# Patient Record
Sex: Male | Born: 1935 | Race: White | Hispanic: No | Smoking: Never smoker
Health system: Southern US, Community
[De-identification: ages and names within clinical notes are randomized; demographics above are authoritative.]

## PROBLEM LIST (undated history)

## (undated) DIAGNOSIS — I350 Nonrheumatic aortic (valve) stenosis: Secondary | ICD-10-CM

## (undated) DIAGNOSIS — Q250 Patent ductus arteriosus: Secondary | ICD-10-CM

## (undated) DIAGNOSIS — Z87442 Personal history of urinary calculi: Secondary | ICD-10-CM

## (undated) DIAGNOSIS — E119 Type 2 diabetes mellitus without complications: Secondary | ICD-10-CM

## (undated) DIAGNOSIS — I2510 Atherosclerotic heart disease of native coronary artery without angina pectoris: Secondary | ICD-10-CM

## (undated) DIAGNOSIS — E785 Hyperlipidemia, unspecified: Secondary | ICD-10-CM

## (undated) DIAGNOSIS — I10 Essential (primary) hypertension: Secondary | ICD-10-CM

## (undated) DIAGNOSIS — H409 Unspecified glaucoma: Secondary | ICD-10-CM

## (undated) DIAGNOSIS — C61 Malignant neoplasm of prostate: Secondary | ICD-10-CM

## (undated) DIAGNOSIS — Z8701 Personal history of pneumonia (recurrent): Secondary | ICD-10-CM

## (undated) DIAGNOSIS — G610 Guillain-Barre syndrome: Secondary | ICD-10-CM

## (undated) HISTORY — DX: Atherosclerotic heart disease of native coronary artery without angina pectoris: I25.10

## (undated) HISTORY — DX: Type 2 diabetes mellitus without complications: E11.9

## (undated) HISTORY — DX: Hyperlipidemia, unspecified: E78.5

## (undated) HISTORY — DX: Essential (primary) hypertension: I10

## (undated) HISTORY — DX: Personal history of urinary calculi: Z87.442

## (undated) HISTORY — DX: Malignant neoplasm of prostate: C61

## (undated) HISTORY — DX: Personal history of pneumonia (recurrent): Z87.01

## (undated) HISTORY — PX: OTHER SURGICAL HISTORY: SHX169

## (undated) HISTORY — DX: Nonrheumatic aortic (valve) stenosis: I35.0

## (undated) HISTORY — PX: COLONOSCOPY: SHX174

## (undated) HISTORY — DX: Patent ductus arteriosus: Q25.0

---

## 1960-12-21 HISTORY — PX: KNEE ARTHROSCOPY: SHX127

## 2002-09-07 ENCOUNTER — Encounter: Admission: RE | Admit: 2002-09-07 | Discharge: 2002-12-06 | Payer: Self-pay | Admitting: Internal Medicine

## 2003-04-19 ENCOUNTER — Ambulatory Visit: Admission: RE | Admit: 2003-04-19 | Discharge: 2003-07-04 | Payer: Self-pay | Admitting: Radiation Oncology

## 2003-04-27 ENCOUNTER — Encounter: Admission: RE | Admit: 2003-04-27 | Discharge: 2003-04-27 | Payer: Self-pay | Admitting: Urology

## 2003-04-27 ENCOUNTER — Encounter: Payer: Self-pay | Admitting: Urology

## 2003-06-08 ENCOUNTER — Ambulatory Visit (HOSPITAL_BASED_OUTPATIENT_CLINIC_OR_DEPARTMENT_OTHER): Admission: RE | Admit: 2003-06-08 | Discharge: 2003-06-08 | Payer: Self-pay | Admitting: Urology

## 2004-09-02 ENCOUNTER — Ambulatory Visit (HOSPITAL_COMMUNITY): Admission: RE | Admit: 2004-09-02 | Discharge: 2004-09-02 | Payer: Self-pay | Admitting: Internal Medicine

## 2009-03-12 ENCOUNTER — Encounter (INDEPENDENT_AMBULATORY_CARE_PROVIDER_SITE_OTHER): Payer: Self-pay | Admitting: Internal Medicine

## 2009-03-12 ENCOUNTER — Ambulatory Visit (HOSPITAL_COMMUNITY): Admission: RE | Admit: 2009-03-12 | Discharge: 2009-03-12 | Payer: Self-pay | Admitting: Internal Medicine

## 2009-03-12 ENCOUNTER — Ambulatory Visit: Payer: Self-pay | Admitting: Cardiology

## 2010-06-28 ENCOUNTER — Emergency Department (HOSPITAL_COMMUNITY): Admission: EM | Admit: 2010-06-28 | Discharge: 2010-06-28 | Payer: Self-pay | Admitting: Emergency Medicine

## 2010-10-15 ENCOUNTER — Ambulatory Visit: Payer: Self-pay | Admitting: Internal Medicine

## 2010-10-15 ENCOUNTER — Ambulatory Visit (HOSPITAL_COMMUNITY): Admission: RE | Admit: 2010-10-15 | Discharge: 2010-10-15 | Payer: Self-pay | Admitting: Internal Medicine

## 2010-12-21 HISTORY — PX: CARPAL TUNNEL RELEASE: SHX101

## 2011-03-08 LAB — DIFFERENTIAL
Basophils Absolute: 0 10*3/uL (ref 0.0–0.1)
Basophils Relative: 0 % (ref 0–1)
Eosinophils Absolute: 0.3 10*3/uL (ref 0.0–0.7)
Eosinophils Relative: 4 % (ref 0–5)
Monocytes Absolute: 0.5 10*3/uL (ref 0.1–1.0)

## 2011-03-08 LAB — BASIC METABOLIC PANEL
BUN: 17 mg/dL (ref 6–23)
Chloride: 104 mEq/L (ref 96–112)
GFR calc non Af Amer: >60 mL/min (ref >60)
Potassium: 4.3 mEq/L (ref 3.5–5.1)
Sodium: 134 mEq/L — ABNORMAL LOW (ref 135–145)

## 2011-03-08 LAB — URINALYSIS, ROUTINE W REFLEX MICROSCOPIC
Bilirubin Urine: NEGATIVE
Ketones, ur: NEGATIVE mg/dL
Nitrite: NEGATIVE
Protein, ur: NEGATIVE mg/dL
Urobilinogen, UA: 0.2 mg/dL (ref 0.0–1.0)
pH: 6 (ref 5.0–8.0)

## 2011-03-08 LAB — URINE CULTURE: Colony Count: NO GROWTH

## 2011-03-08 LAB — CBC
HCT: 40.1 % (ref 39.0–52.0)
Hemoglobin: 13.6 g/dL (ref 13.0–17.0)
MCV: 91.4 fL (ref 78.0–100.0)
RBC: 4.38 MIL/uL (ref 4.22–5.81)
RDW: 13.3 % (ref 11.5–15.5)
WBC: 6.7 10*3/uL (ref 4.0–10.5)

## 2011-05-08 NOTE — Op Note (Signed)
[name], [name]                     ACCOUNT NO.:  [id number]   MEDICAL RECORD NO.:  [id number]                   PATIENT TYPE:  AMB   LOCATION:  DAY                                  FACILITY:  [facility]   PHYSICIAN:  Lionel December, [name]                 DATE OF BIRTH:  [birth year]   DATE OF PROCEDURE:  09/02/2004  DATE OF DISCHARGE:                                 OPERATIVE REPORT   PROCEDURE:  Total colonoscopy.   INDICATIONS:  [name] is a 77 year old Caucasian male who is here for  screening colonoscopy. Family history is negative for colorectal carcinoma.  He has occasional hematochezia felt to be secondary to hemorrhoids.  Procedure and risks were reviewed with the patient and informed consent was  obtained.   PREOPERATIVE MEDICATIONS:  Demerol 25 mg IV, Versed 3 mg IV in divided  doses.   FINDINGS:  Procedure performed in endoscopy suite. The patient's vital signs  and O2 saturations were monitored during procedure and remained stable. The  patient was placed in the left lateral position and rectal examination  performed. No abnormality noted on external or digital exam. Olympus video  scope was placed in the rectum and advanced into sigmoid colon and beyond.  Preparation was excellent. Scope was advanced to the cecum which was  identified by appendiceal orifice and ileocecal valve. Picture taken for the  record. There were 2 small polyps, one at the cecum which was ablated via  cold biopsy, and the other one was at the hepatic flexure which was also  ablated by cold biopsy. These polyps were suspicious for hyperplastic polyps  and were submitted in one container. Mucosa of the rest of the colon was  normal. The rectal mucosa similarly was normal. Scope was retroflexed to  examine anorectal junction, and small hemorrhoids were noted above the  dentate line. The scope was straightened and withdrawn. The patient  tolerated the procedure well.   FINAL DIAGNOSES:  1.  Two  small polyps that were ablated by cold biopsy, one was at the cecum,      another one at the hepatic flexure.  2.  Internal hemorrhoids.   RECOMMENDATIONS:  Standard instructions given. I will be contacting patient  with biopsy results and further recommendations.      ___________________________________________                                            Lionel December, [name]   NR/MEDQ  D:  09/02/2004  T:  09/02/2004  Job:  [id number]

## 2011-05-08 NOTE — Op Note (Signed)
[name], [name]                    ACCOUNT NO.:  [id number]   MEDICAL RECORD NO.:  [id number]                   PATIENT TYPE:  AMB   LOCATION:  [facility]                                 FACILITY:  [facility]   PHYSICIAN:  Ronald L. Ovidio Hanger, [name]           DATE OF BIRTH:  [birth year]   DATE OF PROCEDURE:  06/08/2003  DATE OF DISCHARGE:                                 OPERATIVE REPORT   PREOPERATIVE DIAGNOSIS:  Adenocarcinoma of the prostate.   POSTOPERATIVE DIAGNOSIS:  Adenocarcinoma of the prostate.   PROCEDURE:  1. Transperineal implantation of Iodine-125 seeds in the prostate.  2. Flexible cystourethroscopy.   SURGEON:  Lucrezia Starch. Earlene Plater, [name]   ASSISTANT:  Wynn Banker, [name]   ANESTHESIA:  General laryngeal airway.   ESTIMATED BLOOD LOSS:  15 mL.   DRAINS:  16-French Foley.   COMPLICATIONS:  None.   OPERATIVE FINDINGS:  A total of 86 Iodine-125 seeds were implanted with 27  needles at 0.480 mCi per seed.   INDICATIONS:  [name] is a very nice 77 year old white male who  presented with an elevating PSA to 4.26.  he subsequently underwent  transrectal ultrasound and biopsy of the prostate which revealed a Gleason  score 6 which is 3+3 adenocarcinoma with 5% of the biopsy from the left side  of the prostate.  He has considered all options carefully. After  understanding the risks, benefits and alternatives, he has elected to  proceed with seed implantation.  He has been properly simulated and properly  informed.   DESCRIPTION OF PROCEDURE:  The patient was placed in the supine position.  After proper general laryngeal airway anesthesia, he was prepped and draped  with Betadine in a sterile fashion. After being placed in the dorsal  lithotomy position, a 16-French Foley catheter was inserted.  It was  inflated with 10 mL of contrast solution and the transrectal ultrasound  probe was placed in the Step Device and localized with preplanned  coordinates to  plan to place them 2 cm from the base and was utilized for  implantation.  Localization was then also performed fluoroscopically.  Both  the physical and electronic grids were placed.   A 16-French red rubber catheter was placed into the rectum to prevent flatus  and two holding needles were placed in unused coordinates and proper  measurements were obtained.  Utilizing both ultrasound and fluoroscopic  guidance, serial implantation of the prostate was performed.  A total of 86  seeds of iodine-125 with 27 needles at 0.480 mCi per seed were implanted and  we were comfortable with their localization both pre and postimplant, both  by fluoroscopy and by ultrasound.   Following implantation, the transrectal ultrasound probe was removed as was  the red rubber catheter.  Static images were obtained fluoroscopically for  documentation and the postplan was measured with the ultrasound device and  again confirmed a good symmetry.   The patient was  placed in the supine position after the wound had been  dressed sterilely.  The Foley catheter was removed and urethroscopy was  performed with an Olympus flexible cystourethroscope and he was noted to  have moderate trilobar hypertrophy, grade 1 trabeculation.  Efflux of clear  urine was noted from the normally placed ureteral orifices bilaterally but  there were no lesions, no seeds, no spacers in the bladder and the urethra.  The flexible cystourethroscope was visually removed.  A fresh 16-French  Foley catheter was passed.  The bladder was drained.   The patient was taken to the recovery room stable.                                               Ronald L. Ovidio Hanger, [name]    RLD/MEDQ  D:  06/08/2003  T:  06/08/2003  Job:  [id number]

## 2011-08-31 ENCOUNTER — Ambulatory Visit (HOSPITAL_COMMUNITY)
Admission: RE | Admit: 2011-08-31 | Discharge: 2011-08-31 | Disposition: A | Discharge status: Home / Self Care | Payer: Medicare Other | Source: Ambulatory Visit | Attending: Internal Medicine | Admitting: Internal Medicine

## 2011-08-31 ENCOUNTER — Other Ambulatory Visit (HOSPITAL_COMMUNITY): Payer: Self-pay | Admitting: Internal Medicine

## 2011-08-31 DIAGNOSIS — R059 Cough, unspecified: Secondary | ICD-10-CM

## 2011-08-31 DIAGNOSIS — R05 Cough: Secondary | ICD-10-CM | POA: Insufficient documentation

## 2011-09-04 ENCOUNTER — Encounter (HOSPITAL_BASED_OUTPATIENT_CLINIC_OR_DEPARTMENT_OTHER)
Admission: RE | Admit: 2011-09-04 | Discharge: 2011-09-04 | Disposition: A | Discharge status: Home / Self Care | Payer: Medicare Other | Source: Ambulatory Visit | Attending: Orthopedic Surgery | Admitting: Orthopedic Surgery

## 2011-09-04 LAB — BASIC METABOLIC PANEL
GFR calc Af Amer: >60 mL/min (ref >60)
GFR calc non Af Amer: >60 mL/min (ref >60)
Glucose, Bld: 112 mg/dL — ABNORMAL HIGH (ref 70–99)
Potassium: 4.5 mEq/L (ref 3.5–5.1)
Sodium: 138 mEq/L (ref 135–145)

## 2011-09-08 ENCOUNTER — Ambulatory Visit (HOSPITAL_BASED_OUTPATIENT_CLINIC_OR_DEPARTMENT_OTHER)
Admission: RE | Admit: 2011-09-08 | Discharge: 2011-09-08 | Disposition: A | Discharge status: Home / Self Care | Payer: Medicare Other | Source: Ambulatory Visit | Attending: Orthopedic Surgery | Admitting: Orthopedic Surgery

## 2011-09-08 DIAGNOSIS — Z01810 Encounter for preprocedural cardiovascular examination: Secondary | ICD-10-CM | POA: Insufficient documentation

## 2011-09-08 DIAGNOSIS — I10 Essential (primary) hypertension: Secondary | ICD-10-CM | POA: Insufficient documentation

## 2011-09-08 DIAGNOSIS — G5600 Carpal tunnel syndrome, unspecified upper limb: Secondary | ICD-10-CM | POA: Insufficient documentation

## 2011-09-08 DIAGNOSIS — Z8546 Personal history of malignant neoplasm of prostate: Secondary | ICD-10-CM | POA: Insufficient documentation

## 2011-09-08 DIAGNOSIS — G5620 Lesion of ulnar nerve, unspecified upper limb: Secondary | ICD-10-CM | POA: Insufficient documentation

## 2011-09-08 DIAGNOSIS — Z01812 Encounter for preprocedural laboratory examination: Secondary | ICD-10-CM | POA: Insufficient documentation

## 2011-09-22 NOTE — Op Note (Signed)
[name], [name] NO.:  [id number]  MEDICAL RECORD NO.:  [id number]  LOCATION:                                 FACILITY:  PHYSICIAN:  Cindee Salt, [name]       DATE OF BIRTH:  06/03/1929  DATE OF PROCEDURE:  09/08/2011 DATE OF DISCHARGE:                              OPERATIVE REPORT   PREOPERATIVE DIAGNOSES: 1. Carpal tunnel syndrome, left hand. 2. Cubital tunnel syndrome, left elbow.  POSTOPERATIVE DIAGNOSES: 1. Carpal tunnel syndrome, left hand. 2. Cubital tunnel syndrome, left elbow.  OPERATION: 1. Decompression median nerve, left wrist. 2. Decompression ulnar nerve, left elbow.  SURGEON:  Cindee Salt, MD  ASSISTANT:  Betha Loa, MD  ANESTHESIA:  General with local infiltration.  ANESTHESIOLOGIST:  Bedelia Person, MD.  HISTORY:  The patient is a 77 year old male with a history of numbness and tingling of his fingers of his left hand.  Nerve conductions are positive for carpal tunnel syndrome at his left wrist, cubital tunnel syndrome at his left elbow.  He is desirous of attempting to prevent further damage and hopefully improve sensibility.  He is aware that there is no guarantee with the surgery, possibility of infection; recurrence of injury to arteries, nerves, tendons; incomplete relief of symptoms, dystrophy.  In the preoperative area, the patient is seen. The extremity marked by both the patient and surgeon.  Antibiotic given.  PROCEDURE:  The patient was brought to the operating room where a general anesthetic was carried out without difficulty, was prepped using ChloraPrep, supine position, left arm free.  A 3-minute dry time was allowed.  Time-out taken, confirming the patient and procedure.  The limb was exsanguinated with an Esmarch bandage.  Tourniquet was placed high and the arm was inflated to 250 mmHg.  A longitudinal incision was made in the left palm, carried down through subcutaneous tissue. Bleeders were electrocauterized with  bipolar.  The superficial palmar arch was identified after splitting the palmar fascia.  The flexor tendon of the ring and little finger identified.  Retractor was placed through the ulnar side of the median nerve and carpal retinaculum was incised with sharp dissection, right angle and Sewall retractor were placed between skin and forearm fascia.  The fascia released for approximately a centimeter and a half proximal to the wrist crease under direct vision.  Area compression to the nerve was immediately apparent with an area of hyperemia and an hourglass deformity.  No further lesions were identified.  The wound was irrigated.  Skin closed with interrupted 5-0 Vicryl Rapide sutures.  A separate incision was then made on the left elbow medial side, carried down through subcutaneous tissue.  Bleeders again electrocauterized with bipolar.  The dissection carried down to the medial epicondyle.  Osborne fascia was identified in its posterior aspect.  This was incised revealing the ulnar nerve.  With blunt and sharp dissection, this was dissected free.  The subcutaneous tissue dissected off from the flexor carpi ulnaris fascia.  Two knee retractors were placed.  A fasciotomy was then performed for approximately 6 cm to 7 cm distally.  The muscle was then split.  A KMI retractor for carpal tunnel release was then inserted.  Angled ENT scissors were then passed down the blade, protecting the ulnar nerve distally, releasing the deep fascia of the flexor carpi ulnaris.  This again was done proximally 7 cm distally.  The nerve was identified, found to be intact over its entire course and well decompressed with no further bands.  Dissection was carried proximally.  Again, the subcutaneous tissue dissected off from the proximal forearm, upper arm fascia.  The knee retractors were placed.  The fascia was then split after protecting this with the [facility] carpal tunnel release skid.  The nerve was then  visualized proximally.  No further bands were noted.  The elbow flexed to full flexion, vessels crossing the nerve prevented this from anterior subluxating.  These were left intact.  The wound was copiously irrigated with saline.  Osborne fascia was then repaired to the dorsal posterior skin flap with 2-0 Vicryl sutures.  The subcutaneous tissue closed with interrupted 4-0 Vicryl and the skin with a subcuticular 5-0 Vicryl Rapide.  Each wound was then injected with 0.25% Marcaine without epinephrine, approximately 8 mL was used.  A sterile compressive dressing was applied.  This was long arm in nature with the fingers free.  On deflation of the tourniquet, all fingers immediately pinked.  He was taken to the recovery room for observation in satisfactory condition.  He will be discharged home to return to the [facility] [place] in 1 week on Vicodin.          ______________________________ Cindee Salt, [name]     GK/MEDQ  D:  09/08/2011  T:  09/08/2011  Job:  [id number]  cc:   Kingsley Callander. Ouida Sills, MD  Electronically Signed by Cindee Salt [name] on 09/22/2011 04:39:47 PM

## 2013-06-20 DIAGNOSIS — G610 Guillain-Barre syndrome: Secondary | ICD-10-CM

## 2013-06-20 HISTORY — DX: Guillain-Barre syndrome: G61.0

## 2013-06-30 ENCOUNTER — Other Ambulatory Visit (HOSPITAL_COMMUNITY): Payer: Self-pay | Admitting: Preventative Medicine

## 2013-06-30 DIAGNOSIS — R9389 Abnormal findings on diagnostic imaging of other specified body structures: Secondary | ICD-10-CM

## 2013-07-01 ENCOUNTER — Emergency Department (HOSPITAL_COMMUNITY)
Admission: EM | Admit: 2013-07-01 | Discharge: 2013-07-01 | Disposition: A | Discharge status: Home / Self Care | Payer: Medicare Other | Attending: Emergency Medicine | Admitting: Emergency Medicine

## 2013-07-01 ENCOUNTER — Encounter (HOSPITAL_COMMUNITY): Payer: Self-pay | Admitting: *Deleted

## 2013-07-01 ENCOUNTER — Emergency Department (HOSPITAL_COMMUNITY): Payer: Medicare Other

## 2013-07-01 DIAGNOSIS — Z79899 Other long term (current) drug therapy: Secondary | ICD-10-CM | POA: Insufficient documentation

## 2013-07-01 DIAGNOSIS — Z859 Personal history of malignant neoplasm, unspecified: Secondary | ICD-10-CM | POA: Insufficient documentation

## 2013-07-01 DIAGNOSIS — I10 Essential (primary) hypertension: Secondary | ICD-10-CM | POA: Insufficient documentation

## 2013-07-01 DIAGNOSIS — R0602 Shortness of breath: Secondary | ICD-10-CM | POA: Insufficient documentation

## 2013-07-01 DIAGNOSIS — E7800 Pure hypercholesterolemia, unspecified: Secondary | ICD-10-CM | POA: Insufficient documentation

## 2013-07-01 DIAGNOSIS — R112 Nausea with vomiting, unspecified: Secondary | ICD-10-CM | POA: Insufficient documentation

## 2013-07-01 DIAGNOSIS — R918 Other nonspecific abnormal finding of lung field: Secondary | ICD-10-CM

## 2013-07-01 DIAGNOSIS — Z8701 Personal history of pneumonia (recurrent): Secondary | ICD-10-CM | POA: Insufficient documentation

## 2013-07-01 DIAGNOSIS — E119 Type 2 diabetes mellitus without complications: Secondary | ICD-10-CM | POA: Insufficient documentation

## 2013-07-01 DIAGNOSIS — R05 Cough: Secondary | ICD-10-CM | POA: Insufficient documentation

## 2013-07-01 DIAGNOSIS — R059 Cough, unspecified: Secondary | ICD-10-CM | POA: Insufficient documentation

## 2013-07-01 DIAGNOSIS — Z9889 Other specified postprocedural states: Secondary | ICD-10-CM | POA: Insufficient documentation

## 2013-07-01 DIAGNOSIS — Z7982 Long term (current) use of aspirin: Secondary | ICD-10-CM | POA: Insufficient documentation

## 2013-07-01 DIAGNOSIS — R61 Generalized hyperhidrosis: Secondary | ICD-10-CM | POA: Insufficient documentation

## 2013-07-01 DIAGNOSIS — R0989 Other specified symptoms and signs involving the circulatory and respiratory systems: Secondary | ICD-10-CM | POA: Insufficient documentation

## 2013-07-01 DIAGNOSIS — R222 Localized swelling, mass and lump, trunk: Secondary | ICD-10-CM | POA: Insufficient documentation

## 2013-07-01 LAB — COMPREHENSIVE METABOLIC PANEL
ALT: 138 U/L — ABNORMAL HIGH (ref 0–53)
Albumin: 3 g/dL — ABNORMAL LOW (ref 3.5–5.2)
Alkaline Phosphatase: 125 U/L — ABNORMAL HIGH (ref 39–117)
Glucose, Bld: 204 mg/dL — ABNORMAL HIGH (ref 70–99)
Potassium: 4.1 mEq/L (ref 3.5–5.1)
Sodium: 132 mEq/L — ABNORMAL LOW (ref 135–145)
Total Protein: 6.5 g/dL (ref 6.0–8.3)

## 2013-07-01 LAB — URINE MICROSCOPIC-ADD ON

## 2013-07-01 LAB — CBC WITH DIFFERENTIAL/PLATELET
Basophils Absolute: 0 10*3/uL (ref 0.0–0.1)
Basophils Relative: 0 % (ref 0–1)
Eosinophils Relative: 3 % (ref 0–5)
HCT: 36.8 % — ABNORMAL LOW (ref 39.0–52.0)
MCH: 29.7 pg (ref 26.0–34.0)
MCHC: 34.5 g/dL (ref 30.0–36.0)
MCV: 86.2 fL (ref 78.0–100.0)
Monocytes Absolute: 0.4 10*3/uL (ref 0.1–1.0)
RDW: 12.5 % (ref 11.5–15.5)

## 2013-07-01 LAB — URINALYSIS, ROUTINE W REFLEX MICROSCOPIC
Protein, ur: 30 mg/dL — AB
Urobilinogen, UA: 2 mg/dL — ABNORMAL HIGH (ref 0.0–1.0)

## 2013-07-01 MED ORDER — SODIUM CHLORIDE 0.9 % IV BOLUS (SEPSIS)
500.0000 mL | Freq: Once | INTRAVENOUS | Status: AC
  Administered 2013-07-01: 500 mL via INTRAVENOUS

## 2013-07-01 MED ORDER — OXYCODONE-ACETAMINOPHEN 5-325 MG PO TABS
1.0000 | ORAL_TABLET | Freq: Once | ORAL | Status: AC
  Administered 2013-07-01: 1 via ORAL
  Filled 2013-07-01: qty 1

## 2013-07-01 MED ORDER — CYCLOBENZAPRINE HCL 10 MG PO TABS
10.0000 mg | ORAL_TABLET | Freq: Once | ORAL | Status: AC
  Administered 2013-07-01: 10 mg via ORAL
  Filled 2013-07-01: qty 1

## 2013-07-01 MED ORDER — OXYCODONE-ACETAMINOPHEN 5-325 MG PO TABS: 1.0000 | ORAL_TABLET | ORAL | Status: DC | PRN

## 2013-07-01 MED ORDER — IOHEXOL 300 MG/ML  SOLN
100.0000 mL | Freq: Once | INTRAMUSCULAR | Status: AC | PRN
  Administered 2013-07-01: 100 mL via INTRAVENOUS

## 2013-07-01 MED ORDER — ONDANSETRON HCL 4 MG/2ML IJ SOLN
4.0000 mg | Freq: Once | INTRAMUSCULAR | Status: AC
  Administered 2013-07-01: 4 mg via INTRAVENOUS
  Filled 2013-07-01: qty 2

## 2013-07-01 MED ORDER — CYCLOBENZAPRINE HCL 10 MG PO TABS: 10.0000 mg | ORAL_TABLET | Freq: Two times a day (BID) | ORAL | Status: DC | PRN

## 2013-07-01 NOTE — ED Notes (Signed)
Step-daughter states that the patient can not sit still, complaining of back pain, increasingly agitated. MD notified.

## 2013-07-01 NOTE — ED Notes (Signed)
Pt was seen at an urgent care 06-30-2013 and was given Levofloxacin and a steroid for an "abnormal chest xray." Pt c/o chest congestion x 1wk and nausea and vomiting today. Pt has been given an appointment for a CT chest w contrast for July 15th. Family reports pt seems to be getting weaker and has not been getting any sleep.

## 2013-07-01 NOTE — ED Provider Notes (Signed)
History  This chart was scribed for Donnetta Hutching, MD by Ardelia Mems, ED Scribe. This patient was seen in room [facility]08[facility] and the patient's care was started at 7:36 PM.  CSN: [id number]  Arrival date & time 07/01/13  1844   Chief Complaint  Patient presents with  . Nausea  . Emesis  . Fatigue    The history is provided by the patient and a relative. No language interpreter was used.   HPI Comments: [name] is a 79 y.o. male with a hx of HTN and DM who presents to the Emergency Department complaining of chest congestion of 1 weeks duration with associated nausea and vomiting. Pt also reports an associated non-productive cough over the last week, which he states has subsided. Pt denies chest pain or any other pain. Pt states that he has been exhausted for the last week. Pt was seen in Urgent Care yesterday for SOB, labored breathing, and he was sweaty and clammy. Pt received a CXR yesterday which showed a density in the left hilar region. Pt was treated as though he had pneumonia, given Levofloxacin and a Prednisone shot, and told to come to the ED if his symptoms got worse within 24 hours. Pt is here today because he was eating soup tonight and began vomiting. Family in the room states that pt has been getting weaker and hasn't been able to sleep. Pt takes daily medications for cholesterol, diabetes and hypertension, but states that he is normally healthy and able to walk all day long.  PCP- Dr. Ouida Sills   Past Medical History  Diagnosis Date  . Hypertension   . Diabetes mellitus without complication   . High cholesterol   . Cancer     11 yrs ago   Past Surgical History  Procedure Laterality Date  . Open heart surgery      1951   History reviewed. No pertinent family history. History  Substance Use Topics  . Smoking status: Never Smoker   . Smokeless tobacco: Not on file  . Alcohol Use: No    Review of Systems  Constitutional: Positive for diaphoresis (subsided)  and fatigue. Negative for fever and chills.  HENT: Negative for sore throat, rhinorrhea and neck pain.   Eyes: Negative for visual disturbance.  Respiratory: Positive for cough (subsided) and shortness of breath (subsided).        Chest congestion.  Cardiovascular: Negative for chest pain and leg swelling.  Gastrointestinal: Positive for nausea and vomiting. Negative for abdominal pain and diarrhea.  Genitourinary: Negative for dysuria.  Musculoskeletal: Negative for back pain.  Skin: Negative for rash.  Neurological: Negative for headaches.  Psychiatric/Behavioral: Negative for confusion.   A complete 10 system review of systems was obtained and all systems are negative except as noted in the HPI and PMH.   Allergies  Review of patient's allergies indicates no known allergies.  Home Medications   Current Outpatient Rx  Name  Route  Sig  Dispense  Refill  . aspirin 81 MG tablet   Oral   Take 81 mg by mouth daily.         . Flaxseed, Linseed, (FLAX PO)   Oral   Take 1,000 each by mouth 2 (two) times daily.         . Glucosamine-Chondroit-Vit C-Mn (GLUCOSAMINE CHONDR 1500 COMPLX) CAPS   Oral   Take 1,500 each by mouth 2 (two) times daily.         Marland Kitchen levofloxacin (LEVAQUIN) 500 MG tablet  Oral   Take 500 mg by mouth daily.         . metFORMIN (GLUMETZA) 500 MG (MOD) 24 hr tablet   Oral   Take 500 mg by mouth 2 (two) times daily with a meal.         . Omega-3 Fatty Acids (FISH OIL) 1000 MG CAPS   Oral   Take 1,000 mg by mouth 2 (two) times daily.         . ramipril (ALTACE) 10 MG tablet   Oral   Take 10 mg by mouth daily.         . simvastatin (ZOCOR) 20 MG tablet   Oral   Take 20 mg by mouth at bedtime.          Triage Vitals: BP 143/72  Pulse 104  Temp(Src) 100.6 F (38.1 C) (Oral)  Resp 22  Ht 5\' 9"  (1.753 m)  Wt 190 lb (86.183 kg)  BMI 28.05 kg/m2  SpO2 95%  Physical Exam  Nursing note and vitals reviewed. Constitutional: He is  oriented to person, place, and time. He appears well-developed and well-nourished.  HENT:  Head: Normocephalic and atraumatic.  Eyes: Conjunctivae and EOM are normal. Pupils are equal, round, and reactive to light.  Neck: Normal range of motion. Neck supple.  Cardiovascular: Normal rate, regular rhythm and normal heart sounds.   Pulmonary/Chest: Effort normal and breath sounds normal.  Abdominal: Soft. Bowel sounds are normal.  Musculoskeletal: Normal range of motion.  Neurological: He is alert and oriented to person, place, and time.  Skin: Skin is warm and dry.  Psychiatric: He has a normal mood and affect.    ED Course  Procedures (including critical care time)  DIAGNOSTIC STUDIES: Oxygen Saturation is 95% on RA, normal by my interpretation.    COORDINATION OF CARE: 7:46 PM- Pt advised of plan to check his blood work and urine, give him IV fluids and a CT of his chest to assess his lungs and pt agrees. Pt is also agreeable to receiving ant-nausea medication.  Medications  sodium chloride 0.9 % bolus 500 mL (0 mLs Intravenous Stopped 07/01/13 2132)  sodium chloride 0.9 % bolus 500 mL (0 mLs Intravenous Stopped 07/01/13 2132)  ondansetron (ZOFRAN) injection 4 mg (4 mg Intravenous Given 07/01/13 2020)  iohexol (OMNIPAQUE) 300 MG/ML solution 100 mL (100 mLs Intravenous Contrast Given 07/01/13 2147)   Labs Reviewed  COMPREHENSIVE METABOLIC PANEL - Abnormal; Notable for the following:    Sodium 132 (*)    Chloride 95 (*)    Glucose, Bld 204 (*)    Albumin 3.0 (*)    AST 95 (*)    ALT 138 (*)    Alkaline Phosphatase 125 (*)    GFR calc non Af Amer 75 (*)    GFR calc Af Amer 87 (*)    All other components within normal limits  CBC WITH DIFFERENTIAL - Abnormal; Notable for the following:    Hemoglobin 12.7 (*)    HCT 36.8 (*)    Neutrophils Relative % 86 (*)    Lymphocytes Relative 6 (*)    Lymphs Abs 0.5 (*)    All other components within normal limits  LIPASE, BLOOD -  Abnormal; Notable for the following:    Lipase 102 (*)    All other components within normal limits  URINALYSIS, ROUTINE W REFLEX MICROSCOPIC - Abnormal; Notable for the following:    Specific Gravity, Urine >1.030 (*)    Hgb urine dipstick TRACE (*)  Bilirubin Urine SMALL (*)    Ketones, ur TRACE (*)    Protein, ur 30 (*)    Urobilinogen, UA 2.0 (*)    All other components within normal limits  URINE MICROSCOPIC-ADD ON - Abnormal; Notable for the following:    Bacteria, UA FEW (*)    All other components within normal limits  URINE CULTURE   Ct Chest W Contrast  07/01/2013   *RADIOLOGY REPORT*  Clinical Data: Left hilar soft tissue fullness demonstrated on previous chest radiograph.  Further evaluation.  CT CHEST WITH CONTRAST  Technique:  Multidetector CT imaging of the chest was performed following the standard protocol during bolus administration of intravenous contrast.  Contrast: OMNIPAQUE IOHEXOL 300 MG/ML  SOLN  Comparison: Chest 06/30/2013.  Findings: Left hilar mass causing some narrowing of the left upper lung bronchus with mild postobstructive change in the lingula. This may represent a focal mass or confluent lymphadenopathy.  The structure is measured at about 2.2 x 2.7 cm.  There is additional lymphadenopathy in the subcarinal, pretracheal, and left parabronchial regions.  Subcarinal lymph nodes measure 1.3 x 3.2 cm.  Changes could be due to lymphoma or metastasis.  No parenchymal nodules demonstrated in the left lung.  There is a focal ground-glass nodule in the right lower lung posteriorly measuring about 1.2 cm diameter.  This is indeterminate and could represent inflammatory nodule or neoplastic change.  There is focal pleural thickening along the left posterior lower chest wall.  This also could represent inflammatory or neoplastic change.  Normal heart size.  Coronary artery calcification.  Normal caliber thoracic aorta with calcification.  The esophagus is decompressed.  No pleural effusions.  Visualized portions of the upper abdominal organs are grossly unremarkable.  Suggestion of sub centimeter cyst in the liver, incompletely evaluated.  Small esophageal hiatal hernia.  Emphysematous changes in the lungs.  No pneumothorax.  The diffuse degenerative changes throughout the thoracic spine.  No destructive bone lesions appreciated.  Deformity of the left fifth rib posteriorly is likely postoperative.  IMPRESSION: Left hilar mass/lymphadenopathy causing narrowing of the left upper lobe bronchus.  Mild postobstructive change in the left lingula. Additional left peribronchial and mediastinal lymphadenopathy is also identified.  Left-sided pleural thickening.  Focal ground- glass nodule in the right lung.  Findings may represent lymphoma, primary neoplasm, or metastasis.   Original Report Authenticated By: Burman Nieves, [name]   No diagnosis found.  MDM  CT scan of chest shows a left hilar mass/lymphadenopathy.    This was discussed with the patient and his family. They will followup with her primary care doctor on Monday for further evaluation including biopsy and referral to specialist.   Prescription for Flexeril 10 mg and Percocet given        I personally performed the services described in this documentation, which was scribed in my presence. The recorded information has been reviewed and is accurate.    Donnetta Hutching, MD 07/01/13 [phone]

## 2013-07-03 DIAGNOSIS — I10 Essential (primary) hypertension: Secondary | ICD-10-CM | POA: Insufficient documentation

## 2013-07-03 DIAGNOSIS — R21 Rash and other nonspecific skin eruption: Secondary | ICD-10-CM | POA: Insufficient documentation

## 2013-07-03 DIAGNOSIS — R918 Other nonspecific abnormal finding of lung field: Secondary | ICD-10-CM | POA: Insufficient documentation

## 2013-07-03 DIAGNOSIS — E119 Type 2 diabetes mellitus without complications: Secondary | ICD-10-CM | POA: Insufficient documentation

## 2013-07-03 DIAGNOSIS — E785 Hyperlipidemia, unspecified: Secondary | ICD-10-CM | POA: Insufficient documentation

## 2013-07-03 LAB — URINE CULTURE: Culture: NO GROWTH

## 2013-07-04 ENCOUNTER — Ambulatory Visit (HOSPITAL_COMMUNITY): Payer: Medicare Other

## 2013-07-04 DIAGNOSIS — R0600 Dyspnea, unspecified: Secondary | ICD-10-CM | POA: Insufficient documentation

## 2013-07-04 DIAGNOSIS — E871 Hypo-osmolality and hyponatremia: Secondary | ICD-10-CM | POA: Insufficient documentation

## 2013-07-06 ENCOUNTER — Ambulatory Visit (HOSPITAL_COMMUNITY): Payer: Medicare Other | Attending: Internal Medicine

## 2013-07-07 DIAGNOSIS — I82409 Acute embolism and thrombosis of unspecified deep veins of unspecified lower extremity: Secondary | ICD-10-CM | POA: Insufficient documentation

## 2013-07-13 DIAGNOSIS — G610 Guillain-Barre syndrome: Secondary | ICD-10-CM | POA: Insufficient documentation

## 2013-07-21 HISTORY — PX: VIDEO ASSISTED THORACOSCOPY (VATS)/THOROCOTOMY: SHX6173

## 2013-07-27 ENCOUNTER — Inpatient Hospital Stay
Admission: RE | Admit: 2013-07-27 | Discharge: 2013-09-14 | Disposition: A | Discharge status: Home / Self Care | Payer: Medicare Other | Source: Ambulatory Visit | Attending: Internal Medicine | Admitting: Internal Medicine

## 2013-07-27 DIAGNOSIS — R52 Pain, unspecified: Principal | ICD-10-CM

## 2013-07-28 LAB — GLUCOSE, CAPILLARY
Comment 1: 286421
Comment 1: 286421
Comment 2: 254911
Glucose-Capillary: 201 mg/dL — ABNORMAL HIGH (ref 70–99)
Glucose-Capillary: 140 mg/dL — ABNORMAL HIGH (ref 70–99)

## 2013-07-29 LAB — GLUCOSE, CAPILLARY: Glucose-Capillary: 127 mg/dL — ABNORMAL HIGH (ref 70–99)

## 2013-07-30 LAB — GLUCOSE, CAPILLARY
Glucose-Capillary: 134 mg/dL — ABNORMAL HIGH (ref 70–99)
Glucose-Capillary: 110 mg/dL — ABNORMAL HIGH (ref 70–99)
Glucose-Capillary: 141 mg/dL — ABNORMAL HIGH (ref 70–99)
Glucose-Capillary: 150 mg/dL — ABNORMAL HIGH (ref 70–99)

## 2013-07-31 ENCOUNTER — Other Ambulatory Visit (HOSPITAL_COMMUNITY): Payer: Medicare Other

## 2013-07-31 ENCOUNTER — Ambulatory Visit (HOSPITAL_COMMUNITY)
Admit: 2013-07-31 | Discharge: 2013-07-31 | Disposition: A | Discharge status: Home / Self Care | Payer: Medicare Other | Attending: Internal Medicine | Admitting: Internal Medicine

## 2013-07-31 DIAGNOSIS — M25559 Pain in unspecified hip: Secondary | ICD-10-CM | POA: Insufficient documentation

## 2013-07-31 DIAGNOSIS — M25569 Pain in unspecified knee: Secondary | ICD-10-CM | POA: Insufficient documentation

## 2013-07-31 LAB — GLUCOSE, CAPILLARY: Glucose-Capillary: 126 mg/dL — ABNORMAL HIGH (ref 70–99)

## 2013-08-01 LAB — GLUCOSE, CAPILLARY
Glucose-Capillary: 140 mg/dL — ABNORMAL HIGH (ref 70–99)
Glucose-Capillary: 115 mg/dL — ABNORMAL HIGH (ref 70–99)
Glucose-Capillary: 165 mg/dL — ABNORMAL HIGH (ref 70–99)

## 2013-08-02 LAB — GLUCOSE, CAPILLARY
Glucose-Capillary: 132 mg/dL — ABNORMAL HIGH (ref 70–99)
Glucose-Capillary: 114 mg/dL — ABNORMAL HIGH (ref 70–99)

## 2013-08-04 LAB — GLUCOSE, CAPILLARY
Glucose-Capillary: 121 mg/dL — ABNORMAL HIGH (ref 70–99)
Glucose-Capillary: 130 mg/dL — ABNORMAL HIGH (ref 70–99)
Glucose-Capillary: 121 mg/dL — ABNORMAL HIGH (ref 70–99)

## 2013-08-05 LAB — GLUCOSE, CAPILLARY

## 2013-08-06 LAB — GLUCOSE, CAPILLARY
Glucose-Capillary: 129 mg/dL — ABNORMAL HIGH (ref 70–99)
Glucose-Capillary: 151 mg/dL — ABNORMAL HIGH (ref 70–99)
Glucose-Capillary: 107 mg/dL — ABNORMAL HIGH (ref 70–99)

## 2013-08-07 LAB — GLUCOSE, CAPILLARY: Glucose-Capillary: 136 mg/dL — ABNORMAL HIGH (ref 70–99)

## 2013-08-08 LAB — GLUCOSE, CAPILLARY
Glucose-Capillary: 117 mg/dL — ABNORMAL HIGH (ref 70–99)
Glucose-Capillary: 174 mg/dL — ABNORMAL HIGH (ref 70–99)
Glucose-Capillary: 123 mg/dL — ABNORMAL HIGH (ref 70–99)
Glucose-Capillary: 126 mg/dL — ABNORMAL HIGH (ref 70–99)

## 2013-08-09 LAB — GLUCOSE, CAPILLARY: Glucose-Capillary: 134 mg/dL — ABNORMAL HIGH (ref 70–99)

## 2013-08-10 LAB — GLUCOSE, CAPILLARY: Glucose-Capillary: 164 mg/dL — ABNORMAL HIGH (ref 70–99)

## 2013-08-11 NOTE — H&P (Signed)
[name], [name]           ACCOUNT NO.:  [id number]  MEDICAL RECORD NO.:  [id number]  LOCATION:  RAD                           FACILITY:  [facility]  PHYSICIAN:  Kingsley Callander. Ouida Sills, MD       DATE OF BIRTH:  [birth year]  DATE OF ADMISSION:  07/31/2013 DATE OF DISCHARGE:  08/11/2014LH                             HISTORY & PHYSICAL   HISTORY OF PRESENT ILLNESS:  This patient is an 79 year old white male, who was recently discharged from [facility] and transferred to the [facility] for rehabilitation.  The patient had been found to have a lung mass by chest CT in the [facility] Emergency Room in July.  He developed increasing shortness of breath, and was hospitalized at [facility] on July 03, 2013.  He had a 4 cm left hilar mass, which was felt to be possibly related to lymphoma versus an inflammatory condition.  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 This was felt to possibly be paraneoplastic in origin.  He was treated with plasma phoresis.  Since arriving at the [facility], he has shown slight improvement in his leg strength.  He developed a DVT in his right leg while hospitalized and has been anticoagulated with Coumadin.  He developed an ileus on July 14, 2013, with respiratory compromise requiring brief mechanical ventilation.  He was weaned and has been able to breathe on oxygenating satisfactorily since that time.  He was treated with a 14-day course of fluconazole empirically.  He has since had a lymph node biopsy return revealing cryptococcus.  This was discussed with Dr. Sedalia Muta from Infectious Diseases.  He was started on fluconazole 200 mg daily and will have followup in the Infectious Diseases Clinic at [facility] in September.  Coumadin modification has been required with this therapy.  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  He develops pseudogout in his knees and was treated with aspiration and injections of Kenalog on August 02,2014.  He developed urinary retention requiring Foley catheter placement.  He has had a bone marrow biopsy without a definitive conclusion at this point.  He has a past history of prostate carcinoma.  He has had no evidence of recurrent disease.  He has also had diabetes and hypertension, which have been stable.  He also has a history of aortic stenosis which is mild.  He has a history of a patent ductus with surgery at age 21 at [facility] in [facility]. He also has a remote history of kidney stones, and left knee surgery for cartilage injury.  MEDICATIONS: 1. Carvedilol 6.25 mg b.i.d. 2. Senokot 1 tab b.i.d. 3. MiraLAX 17 g daily p.r.n. for constipation. 4. Vitamin B12, 1000 mcg p.o. daily. 5. Hydrochlorothiazide 25 mg daily. 6. Pyridoxine 50 mg daily. 7. Coumadin 2.5 mg daily. 8. Metformin 500 mg b.i.d. 9. Ramipril 10 mg daily. 10.Simvastatin 20 mg daily. 11.Fluconazole 200 mg daily.  ALLERGIES:  None.  SOCIAL HISTORY:  He does not smoke cigarettes, drink alcohol, or use recreational substances.  FAMILY  HISTORY:  His father had an MI and died at 64.  His mother died of breast cancer.  A brother died at 48 with liver cancer.  Sister had Legionella.  REVIEW OF SYSTEMS:  He is breathing well at this point.  He is not experiencing chest pain or abdominal pain.  He is able to eat without difficulty.  He has significant weakness in his legs with much better strength in his upper extremities.  Foley catheter remains in place.  He has had some left hip pain after some recent physical therapy.  X-rays revealed no fracture.  PHYSICAL EXAMINATION:  GENERAL:  Alert, comfortable appearing, and fully oriented. HEENT:  Eyes reveal no scleral icterus.  Nose and oropharynx are unremarkable. NECK:  Reveals no JVD  or thyromegaly. LUNGS:  Clear. HEART:  Regular with a grade 2 systolic murmur radiating to the neck. ABDOMEN:  Soft and nontender with no palpable organomegaly. EXTREMITIES:  Reveal no clubbing or edema.  No calf tenderness or swelling.  NEURO:  Has marked weakness in the legs greater on the left than the right. SKIN:  Warm and dry. LYMPH NODES:  No cervical or supraclavicular enlargement.  IMPRESSION/PLAN: 1. Left lung mass.  The plan at this point is to obtain a followup CT     scan in 3 months. 2. Positive lymph node biopsy revealing cryptococcus treat with     fluconazole. 3. Right leg deep venous thrombosis.  Continue Coumadin. 4. Guillain-Barre syndrome, status post plasma phoresis, continue     physical therapy. 5. Ileus resolved. 6. Urinary retention.  Continue Foley catheter for now. 7. Syndrome of inappropriate antidiuretic hormone and hyponatremia.     Recheck electrolytes, recheck LFTs. 8. Pseudogout, resolved. 9. Diabetes, continue metformin. 10.Hypertension, continue Altace. 11.Hyperlipidemia, continue ramipril. 12.Mild aortic stenosis. 13.History of patent ductus closure. 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 15.History of prostate carcinoma.  Status post radioactive seed in     2004.     Kingsley Callander. Ouida Sills, MD    ROF/MEDQ  D:  08/11/2013  T:  08/11/2013  Job:  [id number]

## 2013-08-16 LAB — GLUCOSE, CAPILLARY: Glucose-Capillary: 142 mg/dL — ABNORMAL HIGH (ref 70–99)

## 2013-08-18 LAB — GLUCOSE, CAPILLARY
Glucose-Capillary: 133 mg/dL — ABNORMAL HIGH (ref 70–99)
Glucose-Capillary: 141 mg/dL — ABNORMAL HIGH (ref 70–99)

## 2013-08-25 LAB — GLUCOSE, CAPILLARY: Glucose-Capillary: 136 mg/dL — ABNORMAL HIGH (ref 70–99)

## 2013-08-28 LAB — GLUCOSE, CAPILLARY: Glucose-Capillary: 125 mg/dL — ABNORMAL HIGH (ref 70–99)

## 2013-09-06 LAB — GLUCOSE, CAPILLARY: Glucose-Capillary: 125 mg/dL — ABNORMAL HIGH (ref 70–99)

## 2013-09-11 LAB — GLUCOSE, CAPILLARY: Glucose-Capillary: 135 mg/dL — ABNORMAL HIGH (ref 70–99)

## 2013-09-12 NOTE — Progress Notes (Unsigned)
NAMENAPOLEAN, [name]           ACCOUNT NO.:  [id number]  MEDICAL RECORD NO.:  [id number]  LOCATION:  [id number]                          FACILITY:  [facility]  PHYSICIAN:  Kingsley Callander. Ouida Sills, MD       DATE OF BIRTH:  1929-02-09  DATE OF PROCEDURE:  09/07/2013 DATE OF DISCHARGE:                                PROGRESS NOTE   SUBJECTIVE:  [name] has continued to make gradual improvement with his rehab stay.  He is avoiding effectively now without his Foley catheter.  He is walking with his walker.  His leg strength is improving.  He states he still has a diminished appetite.  OBJECTIVE:  GENERAL:  He appears in good spirits.  He is alert and oriented.  Speech is intact. LUNGS:  Clear. HEART:  Regular with a grade 2 systolic murmur. ABDOMEN:  Soft and nontender. EXTREMITIES:  Reveal no edema or calf swelling. NEUROLOGIC:  Reveals he is able to stand independently and walk across the room without difficulty using his walker.  He still has residual lower extremity weakness.  IMPRESSION/PLAN: 1. Lung mass.  He has had followup with Pulmonary and Infectious     Diseases.  He will have a repeat CT scan of the chest next month. 2. Guillain-Barre syndrome, improving.  Continue physical therapy.  He     has had a neurology visit at [facility], however, reports are not     available.  We will try to obtain reports from his 3 recent     evaluations. 3. Deep venous thrombosis.  He has been satisfactorily anticoagulated     with Coumadin. 4. Positive cryptococcus biopsy.  Continue antifungal therapy for     another 6 weeks.  Case has been discussed with his son.     Kingsley Callander. Ouida Sills, MD     ROF/MEDQ  D:  09/12/2013  T:  09/12/2013  Job:  [id number]

## 2013-10-23 ENCOUNTER — Ambulatory Visit (HOSPITAL_COMMUNITY)
Admission: RE | Admit: 2013-10-23 | Discharge: 2013-10-23 | Disposition: A | Discharge status: Home / Self Care | Payer: Medicare Other | Source: Ambulatory Visit | Attending: Internal Medicine | Admitting: Internal Medicine

## 2013-10-23 DIAGNOSIS — E7800 Pure hypercholesterolemia, unspecified: Secondary | ICD-10-CM | POA: Insufficient documentation

## 2013-10-23 DIAGNOSIS — R29898 Other symptoms and signs involving the musculoskeletal system: Secondary | ICD-10-CM | POA: Insufficient documentation

## 2013-10-23 DIAGNOSIS — R262 Difficulty in walking, not elsewhere classified: Secondary | ICD-10-CM | POA: Insufficient documentation

## 2013-10-23 DIAGNOSIS — E119 Type 2 diabetes mellitus without complications: Secondary | ICD-10-CM | POA: Insufficient documentation

## 2013-10-23 DIAGNOSIS — IMO0001 Reserved for inherently not codable concepts without codable children: Secondary | ICD-10-CM | POA: Insufficient documentation

## 2013-10-23 DIAGNOSIS — I10 Essential (primary) hypertension: Secondary | ICD-10-CM | POA: Insufficient documentation

## 2013-10-23 NOTE — Evaluation (Signed)
Physical Therapy Evaluation  Patient Details  Name: [name] MRN: [id number] Date of Birth: 08/15/1929  Today's Date: 10/23/2013 Time: 1435-1520 PT Time Calculation (min): 45 min Charges:  1 evaluation              Visit#: 1 of 10  Re-eval: 11/22/13    Authorization: BCBS Medicare    Authorization Time Period:    Authorization Visit#: 1 of 10   Past Medical History:  Past Medical History  Diagnosis Date  . Hypertension   . Diabetes mellitus without complication   . High cholesterol   . Cancer     11 yrs ago   Past Surgical History:  Past Surgical History  Procedure Laterality Date  . Open heart surgery      1951    Subjective Symptoms/Limitations Symptoms: Pt is an 79 year old male referred to PT for Bil LE weakness (L>R).  Pt was admitted to [facility] on July 14th with Margarita Mail and was referred to [facility] from Aug 7-Sept 25th and then recieved HHPT until last week (when he was then when he was diagnosised with shingles and had to cancel OP PT for last week).   How long can you sit comfortably?: no problem sitting in his favorite chair.  Difficulty sitting for long peroid of time in a hard chair How long can you stand comfortably?: 5-8 minutes How long can you walk comfortably?: 5 minutes  Patient Stated Goals: be able to walk better.  Pain Assessment Currently in Pain?: No/denies Pain Location: Head (Headache) Effect of Pain on Daily Activities: difficulty stepping up on stairs and curbs  Precautions/Restrictions     Balance Screening Balance Screen Has the patient fallen in the past 6 months: Yes How many times?: 3 Has the patient had a decrease in activity level because of a fear of falling? : Yes Is the patient reluctant to leave their home because of a fear of falling? 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[employer],  gardening  Sensation/Coordination/Flexibility/Functional Tests Functional Tests Functional Tests: Sit to stands without UE A: 0x complete  Functional Tests: 1 minute walk test 124' w/quad cane min quard A  Assessment RLE Strength Right Hip Flexion: 3+/5 Right Hip Extension: 3/5 Right Hip ABduction: 4/5 Right Hip ADduction: 3+/5 Right Knee Flexion: 3+/5 Right Knee Extension: 4/5 Right Ankle Dorsiflexion: 3/5 Right Ankle Plantar Flexion: 2+/5 LLE Strength Left Hip Flexion: 3/5 Left Hip Extension: 3-/5 Left Hip ABduction: 3/5 Left Hip ADduction: 3+/5 Left Knee Flexion: 3+/5 Left Knee Extension: 3+/5 Left Ankle Dorsiflexion: 3+/5 Left Ankle Plantar Flexion: 2+/5  Mobility/Balance  Ambulation/Gait Ambulation/Gait: Yes Assistive device: Large base quad cane Static Standing Balance Single Leg Stance - Right Leg: 5 Single Leg Stance - Left Leg: 0 Tandem Stance - Right Leg: 9 Tandem Stance - Left Leg: 8 Rhomberg - Eyes Opened: 30 Rhomberg - Eyes Closed: 10   Physical Therapy Assessment and Plan PT Assessment and Plan Clinical Impression Statement: Pt is an 79 year old male referred to PT for BLE weakness secondary to recent guillan barre with impairment listed below.   Pt will benefit from skilled therapeutic intervention in order to improve on the following deficits: Decreased strength;Decreased range of motion;Abnormal gait;Decreased balance;Difficulty walking Rehab Potential: Good PT Frequency: Min 3X/week PT Duration:  (3x/week x 2weeks, 2x/week x 2 weeks) 4 weeks PT Treatment/Interventions: Gait training;Patient/family  education;Stair training;DME instruction;Therapeutic activities;Therapeutic exercise;Balance training;Neuromuscular re-education PT Plan: Please complete berg balance test and DGI and TUG.  Continue to advance LE exercises to improve stair climbing.     Goals Home Exercise Program Pt/caregiver will Perform Home Exercise Program: Independently PT Goal:  Perform Home Exercise Program - Progress: Goal set today PT Short Term Goals Time to Complete Short Term Goals: 2 weeks PT Short Term Goal 1: Pt will complete the Berg and DGI and TUG for balance testing.  PT Short Term Goal 2: Pt will improve BLE strength by 1 muscle grade in order to go from sit to stand from standard surface without UE Assist PT Short Term Goal 3: Pt will complete 456 feet in 2 minutes with LRAD mod I for age approprriate gait speed.  PT Long Term Goals Time to Complete Long Term Goals: 4 weeks PT Long Term Goal 1: Pt will improve BLE strength to [facility] in order to ascend and descend 10 steps with reciprocal pattern with 1 handrail in order to safely enter community dwellings.  PT Long Term Goal 2: Pt will improve his Berg balance score to 48/56 and DGI to 15/24 to safely ambulate in the community with LRAD.  Long Term Goal 3: Pt will improve his TUG to less than 13 seconds mod I with LRAD for improved gait speed in the community.   Problem List Patient Active Problem List   Diagnosis Date Noted  . Lower extremity weakness 10/23/2013  . Difficulty walking 10/23/2013    PT - End of Session Equipment Utilized During Treatment: Gait belt Activity Tolerance: Patient limited by fatigue PT Plan of Care PT Home Exercise Plan: given PT Patient Instructions: importance of HEP.  Consulted and Agree with Plan of Care: Patient  GP Functional Assessment Tool Used: clinical observation Functional Limitation: Mobility: Walking and moving around Mobility: Walking and Moving Around Current Status [phone]): At least 40 percent but less than 60 percent impaired, limited or restricted Mobility: Walking and Moving Around Goal Status [phone]): At least 20 percent but less than 40 percent impaired, limited or restricted  Cantrell Larouche, MPT, ATC 10/23/2013, 6:00 PM  Physician Documentation Your signature is required to indicate approval of the treatment plan as stated above.  Please sign and  either send electronically or make a copy of this report for your files and return this physician signed original.   Please mark one 1.__approve of plan  2. ___approve of plan with the following conditions.   ______________________________                                                          _____________________ Physician Signature                                                                                                             Date

## 2013-10-25 ENCOUNTER — Ambulatory Visit (HOSPITAL_COMMUNITY)
Admission: RE | Admit: 2013-10-25 | Discharge: 2013-10-25 | Disposition: A | Discharge status: Home / Self Care | Payer: Medicare Other | Source: Ambulatory Visit | Attending: Internal Medicine | Admitting: Internal Medicine

## 2013-10-25 NOTE — Progress Notes (Signed)
Physical Therapy Treatment Patient Details  Name: [name] MRN: [id number] Date of Birth: 1929/10/30  Today's Date: 10/25/2013 Time: 0800-0900 PT Time Calculation (min): 60 min  Visit#: 2 of 10  Re-eval: 11/22/13 Diagnosis: Bil LE weakness Next MD Visit: Dr. Ouida Sills - 10/23/13 Authorization: BCBS Medicare  Authorization Time Period:    Authorization Visit#: 2 of 10  Charges:  PPT  [phone] therex [phone]  Subjective: Symptoms/Limitations Symptoms: Pt states he is ready to get his LE's stronger.  sTates he was at [facility] X 4 weeks and feels his legs still are not strong enough.  Pt is ready to be pushed hard. Pain Assessment Currently in Pain?: No/denies   Exercise/Treatments   Balance testing: Berg Balance Test: 37 Dynamic Gait Index: 12 TUG: Normal TUG Normal TUG (seconds): 23.4  Aerobic Stationary Bike: Nustep 10' hills #2, resistance 3 LE's only Standing Heel Raises: 10 reps;Limitations Heel Raises Limitations: toeraises singles 10 reps each Lateral Step Up: Both;10 reps;Step Height: 4";Hand Hold: 1 Forward Step Up: Both;10 reps;Step Height: 4";Hand Hold: 1 Functional Squat: 10 reps Other Standing Knee Exercises: hip flexion 10 reps with 3" holds Seated Other Seated Knee Exercises: sit to stand with UE's 5 reps    Physical Therapy Assessment and Plan PT Assessment and Plan Clinical Impression Statement: Completed Balance assessments today:  BERG 37/56, DGI: 12, TUG: 23.4 sec using AD.  Added new exercises to increase LE strength in standing.  Pt able to complete all activities without rest break.  Noted with most weakness in quads, Lt weaker than right and eccentric strength most limited.  Worked on sit to stands with use of UE's with less leaning forward of trunk, more with LE's.  Completed session with nustep for LE's only.   PT Duration:  (3x/week x 2weeks, 2x/week x 2 weeks) PT Plan: Complete FOTO next visit.  Continue to advance LE  exercises to include balance/stability and to improve stair climbing.      Problem List Patient Active Problem List   Diagnosis Date Noted  . Lower extremity weakness 10/23/2013  . Difficulty walking 10/23/2013    PT - End of Session Equipment Utilized During Treatment: Gait belt   Lurena Nida, PTA/CLT 10/25/2013, 10:14 AM

## 2013-10-27 ENCOUNTER — Ambulatory Visit (HOSPITAL_COMMUNITY)
Admission: RE | Admit: 2013-10-27 | Discharge: 2013-10-27 | Disposition: A | Discharge status: Home / Self Care | Payer: Medicare Other | Source: Ambulatory Visit

## 2013-10-27 DIAGNOSIS — R262 Difficulty in walking, not elsewhere classified: Secondary | ICD-10-CM

## 2013-10-27 DIAGNOSIS — R29898 Other symptoms and signs involving the musculoskeletal system: Secondary | ICD-10-CM

## 2013-10-27 NOTE — Progress Notes (Signed)
Physical Therapy Treatment Patient Details  Name: [name] MRN: [id number] Date of Birth: 03/10/1929  Today's Date: 10/27/2013 Time: 4098-1191 PT Time Calculation (min): 55 min Charge: TE 1730-1800, NMR 1800-1825  Visit#: 3 of 10  Re-eval: 11/22/13 Assessment Diagnosis: Bil LE weakness Next MD Visit: Dr. Ouida Sills - 10/23/13 Prior Therapy: SNF and HHPT  Authorization: BCBS Medicare  Authorization Time Period:    Authorization Visit#: 3 of 10   Subjective: Symptoms/Limitations Symptoms: Pt stated he is ready to strengthen LE, reported compliance with HEP daily and has been doing more and more.  Pt ready to push hard. Pain Assessment Currently in Pain?: No/denies   Exercise/Treatments Aerobic Stationary Bike: Nustep 10' hills #3, resistance 3, SPM 93 Standing Heel Raises: 10 reps Lateral Step Up: Right;10 reps;Step Height: 6";Left;Step Height: 4";Hand Hold: 2 Forward Step Up: Right;Step Height: 6";Left;Step Height: 4";10 reps;Hand Hold: 1 Step Down: Both;10 reps;Hand Hold: 1;Step Height: 4" Functional Squat: 10 reps Other Standing Knee Exercises: tandem stance 2x 30", tandem gait and retro gait 1RT Seated Long Arc Quad: Both;10 reps;Weights Long Arc Quad Weight: 4 lbs. Other Seated Knee Exercises: sit to stand without UE's on blue foam 5 reps min A Other Seated Knee Exercises: Toe raises Bil 10x w/4#     Physical Therapy Assessment and Plan PT Assessment and Plan Clinical Impression Statement: FOTO complete with intake status at 43%, 57% limitations.  Pt very determined to improve balance and LE strengthening this session.  Session focus on improve functional LE strengthening with stair training, sit to stands without HHA, and balance training to reduce risk of falls.  Noted most weakness in quads Lt>Rt especially with eccentric control.  Began step down training with min assistance required due to quad fatigue.  Pt able to complete LAQs with 4# without difficulty,  progressed to cybex quad machine with noted quad fatique.  Pt able to achieve 93 steps for minute on Nustep with LE only.   PT Plan: Continue to advance LE exercises to include balance/stability and to improve stair climbing. Next session begin cybex leg press and hamstring machines, and balance activities.  Pt interested in [employer] as well.     Goals Home Exercise Program Pt/caregiver will Perform Home Exercise Program: Independently PT Short Term Goals Time to Complete Short Term Goals: 2 weeks PT Short Term Goal 1: Pt will complete the Berg and DGI and TUG for balance testing.  PT Short Term Goal 1 - Progress: Met PT Short Term Goal 2: Pt will improve BLE strength by 1 muscle grade in order to go from sit to stand from standard surface without UE Assist PT Short Term Goal 2 - Progress: Progressing toward goal PT Short Term Goal 3: Pt will complete 456 feet in 2 minutes with LRAD mod I for age approprriate gait speed.  PT Long Term Goals Time to Complete Long Term Goals: 4 weeks PT Long Term Goal 1: Pt will improve BLE strength to [facility] in order to ascend and descend 10 steps with reciprocal pattern with 1 handrail in order to safely enter community dwellings.  PT Long Term Goal 1 - Progress: Progressing toward goal PT Long Term Goal 2: Pt will improve his Berg balance score to 48/56 and DGI to 15/24 to safely ambulate in the community with LRAD.  PT Long Term Goal 2 - Progress: Progressing toward goal Long Term Goal 3: Pt will improve his TUG to less than 13 seconds mod I with LRAD for improved gait speed  in the community.   Problem List Patient Active Problem List   Diagnosis Date Noted  . Lower extremity weakness 10/23/2013  . Difficulty walking 10/23/2013    PT - End of Session Equipment Utilized During Treatment: Gait belt Activity Tolerance: Patient limited by fatigue;Patient tolerated treatment well General Behavior During Therapy: [facility] for tasks  assessed/performed  GP    Juel Burrow 10/27/2013, 6:42 PM

## 2013-10-30 ENCOUNTER — Ambulatory Visit (HOSPITAL_COMMUNITY)
Admission: RE | Admit: 2013-10-30 | Discharge: 2013-10-30 | Disposition: A | Discharge status: Home / Self Care | Payer: Medicare Other | Source: Ambulatory Visit | Attending: Internal Medicine | Admitting: Internal Medicine

## 2013-10-30 NOTE — Progress Notes (Signed)
Physical Therapy Treatment Patient Details  Name: [name] MRN: [id number] Date of Birth: [birth year]  Today's Date: 10/30/2013 Time: 4098-1191 PT Time Calculation (min): 42 min Charges: TE: 1435-1500 NMR: 1500-1517 Visit#: 4 of 10  Re-eval: 11/22/13    Authorization: BCBS Medicare  Authorization Time Period:    Authorization Visit#: 4 of 10   Subjective: Symptoms/Limitations Symptoms: " pI was tired from last time" and reports he is ready to continue to get stronger.  Pain Assessment Currently in Pain?: No/denies  Precautions/Restrictions     Exercise/Treatments Aerobic Stationary Bike: Nustep 10' hills #3, resistance 3, SPM 93 Machines for Strengthening Cybex Knee Extension: BLE 3 PL x15 reps Cybex Knee Flexion: BLE 4 PL x15 reps Standing Lateral Step Up: Right;10 reps;Step Height: 6";Left;Step Height: 4";Hand Hold: 2 Forward Step Up: Right;Step Height: 6";Left;Step Height: 4";10 reps;Hand Hold: 1 Rocker Board: 2 minutes Tandem Stance: Eyes open;3 reps;30 secs;Limitations Tandem Stance Limitations: BLE min A Tandem Gait: Limitations;2 reps Tandem Gait Limitations: min A Retro Gait: Limitations;2 reps Retro Gait Limitations: min A   Physical Therapy Assessment and Plan PT Assessment and Plan Clinical Impression Statement: Continued with LE strengthening at beginning of session and finished with NMR to improve proprioceptive awareness.  With repetition, pt had improved balance on LLE and RLE remains more stable overall.   PT Plan: Add foam standing with knee flexion and marching, cone rotation progress towards retro and tandem gait on balance beam when able.  Pt interested in [employer] as well.     Goals Home Exercise Program Pt/caregiver will Perform Home Exercise Program: Independently PT Goal: Perform Home Exercise Program - Progress: Met PT Short Term Goals Time to Complete Short Term Goals: 2 weeks PT Short Term Goal 1: Pt will complete the  Berg and DGI and TUG for balance testing.  PT Short Term Goal 1 - Progress: Met PT Short Term Goal 2: Pt will improve BLE strength by 1 muscle grade in order to go from sit to stand from standard surface without UE Assist PT Short Term Goal 2 - Progress: Progressing toward goal PT Short Term Goal 3: Pt will complete 456 feet in 2 minutes with LRAD mod I for age approprriate gait speed.  PT Short Term Goal 3 - Progress: Progressing toward goal PT Long Term Goals Time to Complete Long Term Goals: 4 weeks PT Long Term Goal 1: Pt will improve BLE strength to [facility] in order to ascend and descend 10 steps with reciprocal pattern with 1 handrail in order to safely enter community dwellings.  PT Long Term Goal 1 - Progress: Progressing toward goal PT Long Term Goal 2: Pt will improve his Berg balance score to 48/56 and DGI to 15/24 to safely ambulate in the community with LRAD.  PT Long Term Goal 2 - Progress: Progressing toward goal Long Term Goal 3: Pt will improve his TUG to less than 13 seconds mod I with LRAD for improved gait speed in the community.  Long Term Goal 3 Progress: Progressing toward goal  Problem List Patient Active Problem List   Diagnosis Date Noted  . Lower extremity weakness 10/23/2013  . Difficulty walking 10/23/2013    PT - End of Session Equipment Utilized During Treatment: Gait belt Activity Tolerance: Patient limited by fatigue;Patient tolerated treatment well General Behavior During Therapy: [facility] for tasks assessed/performed  GP    Lenor Provencher 10/30/2013, 3:24 PM

## 2013-11-01 ENCOUNTER — Ambulatory Visit (HOSPITAL_COMMUNITY)
Admission: RE | Admit: 2013-11-01 | Discharge: 2013-11-01 | Disposition: A | Discharge status: Home / Self Care | Payer: Medicare Other | Source: Ambulatory Visit | Attending: Internal Medicine | Admitting: Internal Medicine

## 2013-11-01 DIAGNOSIS — R262 Difficulty in walking, not elsewhere classified: Secondary | ICD-10-CM

## 2013-11-01 DIAGNOSIS — R29898 Other symptoms and signs involving the musculoskeletal system: Secondary | ICD-10-CM

## 2013-11-01 NOTE — Progress Notes (Addendum)
Physical Therapy Treatment Patient Details  Name: [name] MRN: [id number] Date of Birth: 10/08/1929  Today's Date: 11/01/2013 Time: 4098-1191 PT Time Calculation (min): 40 min Charges: TE: 1435-1500 NMR: 1500-1515 Visit#: 5 of 10  Re-eval: 11/22/13    Authorization: BCBS Medicare  Authorization Time Period:    Authorization Visit#: 5 of 10   Subjective: Symptoms/Limitations Symptoms: "I really feel like what y'all are doing for me is really helping.Marland KitchenMarland KitchenI was able to work all day yesterday and I thought my legs were going to give out on my at the end of the day, but I was able to make it home without a problem." Pain Assessment Currently in Pain?: Yes  Precautions/Restrictions     Exercise/Treatments Aerobic Stationary Bike: Nustep 10' hills #3, resistance 3, SPM 93 Machines for Strengthening Cybex Knee Extension: BLE 3.5 PL x15 reps Cybex Knee Flexion: BLE 4.5 PL x15 reps Standing Rocker Board: 2 minutes Standing, One Foot on a Step: Eyes open;3 reps;15 secs;Limitations;4 inch Standing, One Foot on a Step Limitations: BLE, contact guard Tandem Gait: Limitations;2 reps Tandem Gait Limitations: min A Retro Gait: Limitations;2 reps Retro Gait Limitations: min A Turning: Both;10 reps;Limitations Turning Limitations: foam with min A Marching: Foam;Limitations Marching Limitations: 2x30 sec min A   Physical Therapy Assessment and Plan PT Assessment and Plan Clinical Impression Statement: Continues to improve LE strength and funciton.  able to perform balance activities with improved posture and form.  Lt and Rt LE have equal difficulty with balance exercise, pt reports greater difficulty with Rt LE strength during strengthening activities.  PT Plan: add cone rotation and numbers.  Progress towards balance beam.  interested in [employer] at end.     Goals    Problem List Patient Active Problem List   Diagnosis Date Noted  . Lower extremity weakness 10/23/2013   . Difficulty walking 10/23/2013    PT - End of Session Equipment Utilized During Treatment: Gait belt Activity Tolerance: Patient limited by fatigue;Patient tolerated treatment well General Behavior During Therapy: [facility] for tasks assessed/performed  GP    Kailynn Satterly, MPT, ATC 11/01/2013, 3:41 PM

## 2013-11-03 ENCOUNTER — Ambulatory Visit (HOSPITAL_COMMUNITY)
Admission: RE | Admit: 2013-11-03 | Discharge: 2013-11-03 | Disposition: A | Discharge status: Home / Self Care | Payer: Medicare Other | Source: Ambulatory Visit | Attending: Internal Medicine | Admitting: Internal Medicine

## 2013-11-03 DIAGNOSIS — R262 Difficulty in walking, not elsewhere classified: Secondary | ICD-10-CM

## 2013-11-03 DIAGNOSIS — R29898 Other symptoms and signs involving the musculoskeletal system: Secondary | ICD-10-CM

## 2013-11-03 NOTE — Progress Notes (Signed)
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  He reports with his insurance he is able to go to the gym for free.  Pain Assessment Currently in Pain?: No/denies  Precautions/Restrictions     Exercise/Treatments Aerobic Stationary Bike: Bike: 10 minutes 4.0 for strengthening  Machines for Strengthening Cybex Knee Extension: BLE 2.0 PL 2x15 reps Cybex Knee Flexion: BLE 4.5 PL 2x15 reps Standing Forward Step Up: Both;10 reps;Hand Hold: 1;Step Height: 6";Limitations Forward Step Up Limitations: min A  Balance Exercises Standing Balance Beam: Forward 2 reps min A Retro Gait: 2 reps;Limitations Retro Gait Limitations: min  Turning: Both;3 reps;Limitations Turning Limitations: full turns 2-4 seconds Numbers 1-15: Foam;2 reps;Limitations Numbers 1-15 Limitations: 1 rep w/Rt arm; 1 rep with Lt arm   Physical Therapy Assessment and Plan PT Assessment and Plan Clinical Impression Statement: Concentration on balance and improving activity tolerance today. Able to add foam activities to improve dynamic balance.  Overall pt requires less assistance by end of session.  PT Plan: Add cone rotation on foam, SLS on foam, side walking with squqats    Goals Home Exercise Program Pt/caregiver will Perform Home Exercise Program: Independently PT Short Term Goals Time to Complete Short Term Goals: 2 weeks PT Short Term Goal 1: Pt will complete the Berg and DGI and TUG for balance testing.  PT Short Term Goal 1 - Progress: Met PT Short Term Goal  2: Pt will improve BLE strength by 1 muscle grade in order to go from sit to stand from standard surface without UE Assist PT Short Term Goal 2 - Progress: Progressing toward goal PT Short Term Goal 3: Pt will complete 456 feet in 2 minutes with LRAD mod I for age approprriate gait speed.  PT Short Term Goal 3 - Progress: Progressing toward goal PT Long Term Goals Time to Complete Long Term Goals: 4 weeks PT Long Term Goal 1: Pt will improve BLE strength to [facility] in order to ascend and descend 10 steps with reciprocal pattern with 1 handrail in order to safely enter community dwellings.  PT Long Term Goal 1 - Progress: Progressing toward goal PT Long Term Goal 2: Pt will improve his Berg balance score to 48/56 and DGI to 15/24 to safely ambulate in the community with LRAD.  PT Long Term Goal 2 - Progress: Progressing toward goal Long Term Goal 3: Pt will improve his TUG to less than 13 seconds mod I with LRAD for improved gait speed in the community.  Long Term Goal 3 Progress: Progressing toward goal  Problem List Patient Active Problem List   Diagnosis Date Noted  . Lower extremity weakness 10/23/2013  . Difficulty walking 10/23/2013    PT - End of Session Equipment Utilized During Treatment: Gait belt Activity Tolerance: Patient limited by fatigue;Patient tolerated treatment well General Behavior During Therapy: [facility] for tasks assessed/performed  GP    Dana Dorner, MPT, ATC 11/03/2013, 3:31 PM

## 2013-11-06 ENCOUNTER — Ambulatory Visit (HOSPITAL_COMMUNITY)
Admission: RE | Admit: 2013-11-06 | Discharge: 2013-11-06 | Disposition: A | Discharge status: Home / Self Care | Payer: Medicare Other | Source: Ambulatory Visit | Attending: Internal Medicine | Admitting: Internal Medicine

## 2013-11-06 NOTE — Progress Notes (Signed)
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  States he stumbled a few times but was able to self correct his balance without incident.  Pain Assessment Currently in Pain?: No/denies   Exercise/Treatments Aerobic Stationary Bike: Nustep:  10 minutes hills #3, level 3 LE's only for strengthening  Machines for Strengthening Cybex Knee Extension: BLE 2.0 PL 2x15 reps Cybex Knee Flexion: BLE 4.5 PL 2x15 reps Cybex Leg Press: dorsi/plantar 2PL 10 reps Standing Lateral Step Up: Right;10 reps;Left;Step Height: 4";Hand Hold: 1 SLS: bilateral on foam 15" max on Lt, 20" Rt Other Standing Knee Exercises: balance beam forward/lateral 1RT each Other Standing Knee Exercises: side step with squats 1RT     Physical Therapy Assessment and Plan PT Assessment and Plan Clinical Impression Statement: Progressed balance activities using foam for SLS, lateral steps with squats.  Pt requires tactile cues to improve form/correct use of quad eccentrics with lateral step downs on Left.  Pt required 2 short rest breaks during session due to "Leg feels its gonna give out". PT Plan: Add cone rotation on foam and continue to progress stabiltiy.     Problem List Patient Active Problem List   Diagnosis Date Noted  . Lower extremity weakness 10/23/2013  . Difficulty walking 10/23/2013    PT - End of Session Equipment Utilized During Treatment: Gait belt Activity Tolerance: Patient limited by fatigue;Patient tolerated treatment well General Behavior During Therapy:  [facility] for tasks assessed/performed   Lurena Nida, PTA/CLT 11/06/2013, 3:27 PM

## 2013-11-08 ENCOUNTER — Ambulatory Visit (HOSPITAL_COMMUNITY)
Admission: RE | Admit: 2013-11-08 | Discharge: 2013-11-08 | Disposition: A | Discharge status: Home / Self Care | Payer: Medicare Other | Source: Ambulatory Visit | Attending: Internal Medicine | Admitting: Internal Medicine

## 2013-11-08 DIAGNOSIS — R262 Difficulty in walking, not elsewhere classified: Secondary | ICD-10-CM

## 2013-11-08 DIAGNOSIS — R29898 Other symptoms and signs involving the musculoskeletal system: Secondary | ICD-10-CM

## 2013-11-08 NOTE — Progress Notes (Signed)
Physical Therapy Treatment Patient Details  Name: [name] MRN: [id number] Date of Birth: 27-Mar-1929  Today's Date: 11/08/2013 Time: 4098-1191 PT Time Calculation (min): 43 min Charges TE: 4782-95621 Visit#: 8 of 10  Re-eval: 11/22/13    Authorization: BCBS Medicare  Authorization Time Period:    Authorization Visit#: 8 of 10   Subjective: Symptoms/Limitations Symptoms: He states he has not been able to walk without his cane since Saturday.  Pain Assessment Currently in Pain?: No/denies  Precautions/Restrictions     Exercise/Treatments Aerobic Stationary Bike: Nustep:  10 minutes hills #3, level 4 LE's only for strengthening  Machines for Strengthening Cybex Knee Extension: BLE 4.0 PL 3x10 reps Cybex Knee Flexion: BLE 5 PL 3x10 Cybex Leg Press: BLE 6 PL 3x10    Physical Therapy Assessment and Plan PT Assessment and Plan Clinical Impression Statement: Focused on improving strengthening and acitivty tolerance today and added weight with decreased reps and increased sets. PT Plan: Add cone rotation    Goals    Problem List Patient Active Problem List   Diagnosis Date Noted  . Lower extremity weakness 10/23/2013  . Difficulty walking 10/23/2013    PT - End of Session Equipment Utilized During Treatment: Gait belt Activity Tolerance: Patient limited by fatigue;Patient tolerated treatment well General Behavior During Therapy: [facility] for tasks assessed/performed  GP    Aarion Metzgar 11/08/2013, 2:41 PM

## 2013-11-13 ENCOUNTER — Ambulatory Visit (HOSPITAL_COMMUNITY)
Admission: RE | Admit: 2013-11-13 | Discharge: 2013-11-13 | Disposition: A | Discharge status: Home / Self Care | Payer: Medicare Other | Source: Ambulatory Visit | Attending: Internal Medicine | Admitting: Internal Medicine

## 2013-11-13 DIAGNOSIS — R29898 Other symptoms and signs involving the musculoskeletal system: Secondary | ICD-10-CM

## 2013-11-13 DIAGNOSIS — R262 Difficulty in walking, not elsewhere classified: Secondary | ICD-10-CM

## 2013-11-14 NOTE — Evaluation (Addendum)
Physical Therapy Discharge Summary/Treatment  Patient Details  Name: [name] MRN: [id number] Date of Birth: [birth year]  Today's Date: 11/13/2013 Time: 1478-2956 PT Time Calculation (min): 35 min Charges:  1 MMT PPT: 1445-1500 TE: 1500-1515             Visit#: 8 of 10  Re-eval: 11/22/13 Assessment Diagnosis: Bil LE weakness Next MD Visit: Dr. Ouida Sills - 11/24/13  Authorization: BCBS Medicare    Authorization Time Period:    Authorization Visit#: 8 of 10   Subjective Symptoms/Limitations Symptoms: Pt reports he is still sore from Wedensday.  He reports that he is not walking with a cane How long can you stand comfortably?: 20 minutes ( was 5-8 minutes) How long can you walk comfortably?: 15 minutes (was 5 minutes w/quad cane) Pain Assessment Currently in Pain?: No/denies  Sensation/Coordination/Flexibility/Functional Tests Functional Tests Functional Tests: FOTO Status 53%, Limitations 57% Functional Tests: Sit to stands without UE A: 0x complete  Functional Tests: 1 minute walk test 133' independent (was 70' w/quad cane min quard A)  RLE Strength Right Hip Flexion: 4/5 (was 3+/5) Right Hip Extension: 4/5 (was 3/5) Right Hip ABduction: 4/5 (was 4/5) Right Hip ADduction: 4/5 (as 3+/5) Right Knee Flexion: 5/5 (was 3+/5) Right Knee Extension: 5/5 (was 4/5) Right Ankle Dorsiflexion: 3+/5 (was 3/5) Right Ankle Plantar Flexion: 3+/5 (was 2+/5) LLE Strength Left Hip Flexion: 4/5 (was 3/5) Left Hip Extension: 4/5 (was 3-/5) Left Hip ABduction: 3+/5 (was 3/58) Left Hip ADduction: 3+/5 (was 3+/5) Left Knee Flexion: 5/5 (was 3+/5) Left Knee Extension: 5/5 (as 3+/5) Left Ankle Dorsiflexion: 3+/5 (was 3+/5) Left Ankle Plantar Flexion: 3+/5 (was 2+/5)  Mobility/Balance  Ambulation/Gait Ambulation/Gait: Yes Assistive device: None Static Standing Balance Single Leg Stance - Right Leg: 5 (was 5) Single Leg Stance - Left Leg: 5 (was 5) Tandem Stance - Right Leg:  30 Tandem Stance - Left Leg: 10 Berg Balance Test Sit to Stand: Able to stand  independently using hands Standing Unsupported: Able to stand safely 2 minutes Sitting with Back Unsupported but Feet Supported on Floor or Stool: Able to sit safely and securely 2 minutes Stand to Sit: Sits safely with minimal use of hands Transfers: Able to transfer safely, minor use of hands Standing Unsupported with Eyes Closed: Able to stand 10 seconds safely Standing Ubsupported with Feet Together: Able to place feet together independently and stand 1 minute safely From Standing, Reach Forward with Outstretched Arm: Can reach confidently >25 cm (10") From Standing Position, Pick up Object from Floor: Able to pick up shoe safely and easily From Standing Position, Turn to Look Behind Over each Shoulder: Looks behind from both sides and weight shifts well Turn 360 Degrees: Able to turn 360 degrees safely in 4 seconds or less Standing Unsupported, Alternately Place Feet on Step/Stool: Able to stand independently and safely and complete 8 steps in 20 seconds Standing Unsupported, One Foot in Front: Able to take small step independently and hold 30 seconds Standing on One Leg: Able to lift leg independently and hold 5-10 seconds Total Score: 52 Dynamic Gait Index Level Surface: Normal Change in Gait Speed: Mild Impairment Gait with Horizontal Head Turns: Mild Impairment Gait with Vertical Head Turns: Mild Impairment Gait and Pivot Turn: Mild Impairment Step Over Obstacle: Mild Impairment Step Around Obstacles: Mild Impairment Steps: Mild Impairment Total Score: 17   Exercise/Treatments Aerobic Stationary Bike: Nustep:  10 minutes hills #3, level 4 LE's only for strengthening  Machines for Strengthening Cybex Knee Extension: BLE  3.5 PL 2x15 reps Cybex Knee Flexion: BLE 5 PL 2x15   Physical Therapy Assessment and Plan PT Assessment and Plan Clinical Impression Statement: [name] has attended 8 OP  PT vistis over the past 4 weeks with the following findings: at this time pt is ready for D/C and is ambulating independently outdoors and indoors.  he is most limited by his activity tolerance in which he feels is coming back gradually.  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  Will d/c from PT at this time. PT Plan: D/C    Goals Home Exercise Program Pt/caregiver will Perform Home Exercise Program: Independently PT Goal: Perform Home Exercise Program - Progress: Met PT Short Term Goals Time to Complete Short Term Goals: 2 weeks PT Short Term Goal 1: Pt will complete the Berg and DGI and TUG for balance testing.  PT Short Term Goal 1 - Progress: Met PT Short Term Goal 2: Pt will improve BLE strength by 1 muscle grade in order to go from sit to stand from standard surface without UE Assist PT Short Term Goal 2 - Progress: Met PT Short Term Goal 3: Pt will complete 456 feet in 2 minutes with LRAD mod I for age approprriate gait speed.  PT Short Term Goal 3 - Progress: Progressing toward goal PT Long Term Goals Time to Complete Long Term Goals: 4 weeks PT Long Term Goal 1: Pt will improve BLE strength to [facility] in order to ascend and descend 10 steps with reciprocal pattern with 1 handrail in order to safely enter community dwellings.  PT Long Term Goal 1 - Progress: Met PT Long Term Goal 2: Pt will improve his Berg balance score to 48/56 and DGI to 15/24 to safely ambulate in the community with LRAD.  PT Long Term Goal 2 - Progress: Met Long Term Goal 3: Pt will improve his TUG to less than 13 seconds mod I with LRAD for improved gait speed in the community.  Long Term Goal 3 Progress: Progressing toward goal  Problem List Patient Active Problem List   Diagnosis Date Noted  . Lower extremity weakness 10/23/2013  . Difficulty walking 10/23/2013    PT - End of Session Equipment Utilized During Treatment: Gait belt Activity Tolerance: Patient limited  by fatigue;Patient tolerated treatment well General Behavior During Therapy: [facility] for tasks assessed/performed  GP   [facility] PT THERAPY MOBILITY DISCHARGE STATUS [id number] 11/13/2013 Matilde Haymaker, PT GP, CJ 1 Filed [facility] PT THERAPY MOBILITY GOAL STATUS [id number] 11/13/2013 Matilde Haymaker, PT GP, CJ 1 Filed   Lennart Gladish [place], [place], ATC 11/14/2013, 4:22 PM  Physician Documentation Your signature is required to indicate approval of the treatment plan as stated above.  Please sign and either send electronically or make a copy of this report for your files and return this physician signed original.   Please mark one 1.__approve of plan  2. ___approve of plan with the following conditions.   ______________________________                                                          _____________________ Physician Signature  Date  

## 2013-11-15 ENCOUNTER — Ambulatory Visit (HOSPITAL_COMMUNITY): Payer: Medicare Other | Admitting: Physical Therapy

## 2014-06-07 ENCOUNTER — Encounter (HOSPITAL_BASED_OUTPATIENT_CLINIC_OR_DEPARTMENT_OTHER): Payer: Self-pay | Admitting: *Deleted

## 2014-06-07 NOTE — Progress Notes (Signed)
Pt had guillian barre 8/14-was in rehab-better now-home driving-clear-had ekg 7/14 [facility] Will need [employer]

## 2014-06-07 NOTE — Progress Notes (Signed)
06/07/14 1425  OBSTRUCTIVE SLEEP APNEA  Have you ever been diagnosed with sleep apnea through a sleep study? No  Do you snore loudly (loud enough to be heard through closed doors)?  0  Do you often feel tired, fatigued, or sleepy during the daytime? 0  Has anyone observed you stop breathing during your sleep? 0  Do you have, or are you being treated for high blood pressure? 1  BMI more than 35 kg/m2? 0  Age over 78 years old? 1  Neck circumference greater than 40 cm/16 inches? 1  Gender: 1  Obstructive Sleep Apnea Score 4  Score 4 or greater  Results sent to PCP

## 2014-06-11 ENCOUNTER — Other Ambulatory Visit: Payer: Self-pay | Admitting: Orthopedic Surgery

## 2014-06-12 ENCOUNTER — Ambulatory Visit (HOSPITAL_BASED_OUTPATIENT_CLINIC_OR_DEPARTMENT_OTHER): Payer: Medicare Other | Admitting: Anesthesiology

## 2014-06-12 ENCOUNTER — Encounter (HOSPITAL_BASED_OUTPATIENT_CLINIC_OR_DEPARTMENT_OTHER): Payer: Medicare Other | Admitting: Anesthesiology

## 2014-06-12 ENCOUNTER — Encounter (HOSPITAL_BASED_OUTPATIENT_CLINIC_OR_DEPARTMENT_OTHER): Payer: Self-pay | Admitting: Orthopedic Surgery

## 2014-06-12 ENCOUNTER — Ambulatory Visit (HOSPITAL_BASED_OUTPATIENT_CLINIC_OR_DEPARTMENT_OTHER)
Admission: RE | Admit: 2014-06-12 | Discharge: 2014-06-12 | Disposition: A | Discharge status: Home / Self Care | Payer: Medicare Other | Source: Ambulatory Visit | Attending: Orthopedic Surgery | Admitting: Orthopedic Surgery

## 2014-06-12 ENCOUNTER — Other Ambulatory Visit: Payer: Self-pay | Admitting: Orthopedic Surgery

## 2014-06-12 ENCOUNTER — Encounter (HOSPITAL_BASED_OUTPATIENT_CLINIC_OR_DEPARTMENT_OTHER)
Admission: RE | Disposition: A | Discharge status: Home / Self Care | Payer: Self-pay | Source: Ambulatory Visit | Attending: Orthopedic Surgery

## 2014-06-12 DIAGNOSIS — E7800 Pure hypercholesterolemia, unspecified: Secondary | ICD-10-CM | POA: Insufficient documentation

## 2014-06-12 DIAGNOSIS — E119 Type 2 diabetes mellitus without complications: Secondary | ICD-10-CM | POA: Insufficient documentation

## 2014-06-12 DIAGNOSIS — I10 Essential (primary) hypertension: Secondary | ICD-10-CM | POA: Insufficient documentation

## 2014-06-12 DIAGNOSIS — G5620 Lesion of ulnar nerve, unspecified upper limb: Secondary | ICD-10-CM | POA: Insufficient documentation

## 2014-06-12 DIAGNOSIS — Z7982 Long term (current) use of aspirin: Secondary | ICD-10-CM | POA: Insufficient documentation

## 2014-06-12 DIAGNOSIS — G5600 Carpal tunnel syndrome, unspecified upper limb: Secondary | ICD-10-CM | POA: Insufficient documentation

## 2014-06-12 DIAGNOSIS — I2510 Atherosclerotic heart disease of native coronary artery without angina pectoris: Secondary | ICD-10-CM | POA: Insufficient documentation

## 2014-06-12 DIAGNOSIS — Z8546 Personal history of malignant neoplasm of prostate: Secondary | ICD-10-CM | POA: Insufficient documentation

## 2014-06-12 HISTORY — PX: CARPAL TUNNEL RELEASE: SHX101

## 2014-06-12 HISTORY — DX: Guillain-Barre syndrome: G61.0

## 2014-06-12 HISTORY — PX: ULNAR NERVE TRANSPOSITION: SHX2595

## 2014-06-12 LAB — COMPREHENSIVE METABOLIC PANEL
ALBUMIN: 4.1 g/dL (ref 3.5–5.2)
ALK PHOS: 74 U/L (ref 39–117)
ALT: 18 U/L (ref 0–53)
AST: 18 U/L (ref 0–37)
BUN: 34 mg/dL — AB (ref 6–23)
CO2: 23 mEq/L (ref 19–32)
Calcium: 9.4 mg/dL (ref 8.4–10.5)
Chloride: 104 mEq/L (ref 96–112)
Creatinine, Ser: 1.25 mg/dL (ref 0.50–1.35)
GFR calc Af Amer: 60 mL/min — ABNORMAL LOW (ref >90)
GFR calc non Af Amer: 52 mL/min — ABNORMAL LOW (ref >90)
Glucose, Bld: 140 mg/dL — ABNORMAL HIGH (ref 70–99)
POTASSIUM: 5 meq/L (ref 3.7–5.3)
Sodium: 141 mEq/L (ref 137–147)
Total Bilirubin: 0.5 mg/dL (ref 0.3–1.2)
Total Protein: 7.4 g/dL (ref 6.0–8.3)

## 2014-06-12 LAB — POCT HEMOGLOBIN-HEMACUE: HEMOGLOBIN: 11.4 g/dL — AB (ref 13.0–17.0)

## 2014-06-12 LAB — GLUCOSE, CAPILLARY: Glucose-Capillary: 134 mg/dL — ABNORMAL HIGH (ref 70–99)

## 2014-06-12 SURGERY — CARPAL TUNNEL RELEASE
Anesthesia: General | Site: Wrist | Laterality: Right

## 2014-06-12 MED ORDER — EPHEDRINE SULFATE 50 MG/ML IJ SOLN
INTRAMUSCULAR | Status: DC | PRN
  Administered 2014-06-12 (×2): 10 mg via INTRAVENOUS

## 2014-06-12 MED ORDER — FENTANYL CITRATE 0.05 MG/ML IJ SOLN
INTRAMUSCULAR | Status: DC | PRN
  Administered 2014-06-12: 50 ug via INTRAVENOUS
  Administered 2014-06-12 (×2): 25 ug via INTRAVENOUS

## 2014-06-12 MED ORDER — BUPIVACAINE HCL (PF) 0.25 % IJ SOLN
INTRAMUSCULAR | Status: DC | PRN
  Administered 2014-06-12: 10 mL

## 2014-06-12 MED ORDER — PROPOFOL 10 MG/ML IV BOLUS
INTRAVENOUS | Status: DC | PRN
  Administered 2014-06-12: 100 mg via INTRAVENOUS

## 2014-06-12 MED ORDER — LACTATED RINGERS IV SOLN
INTRAVENOUS | Status: DC
  Administered 2014-06-12 (×2): via INTRAVENOUS

## 2014-06-12 MED ORDER — CEFAZOLIN SODIUM-DEXTROSE 2-3 GM-% IV SOLR
INTRAVENOUS | Status: AC
  Filled 2014-06-12: qty 50

## 2014-06-12 MED ORDER — HYDROMORPHONE HCL PF 1 MG/ML IJ SOLN
0.2500 mg | INTRAMUSCULAR | Status: DC | PRN
Start: 2014-06-12 — End: 2014-06-12

## 2014-06-12 MED ORDER — BUPIVACAINE HCL (PF) 0.25 % IJ SOLN
INTRAMUSCULAR | Status: AC
  Filled 2014-06-12: qty 30

## 2014-06-12 MED ORDER — CHLORHEXIDINE GLUCONATE 4 % EX LIQD: 60.0000 mL | Freq: Once | CUTANEOUS | Status: DC

## 2014-06-12 MED ORDER — CEFAZOLIN SODIUM-DEXTROSE 2-3 GM-% IV SOLR: 2.0000 g | INTRAVENOUS | Status: DC

## 2014-06-12 MED ORDER — FENTANYL CITRATE 0.05 MG/ML IJ SOLN
50.0000 ug | INTRAMUSCULAR | Status: DC | PRN
Start: 2014-06-12 — End: 2014-06-12

## 2014-06-12 MED ORDER — DEXAMETHASONE SODIUM PHOSPHATE 4 MG/ML IJ SOLN
INTRAMUSCULAR | Status: DC | PRN
  Administered 2014-06-12: 4 mg via INTRAVENOUS

## 2014-06-12 MED ORDER — FENTANYL CITRATE 0.05 MG/ML IJ SOLN
INTRAMUSCULAR | Status: AC
  Filled 2014-06-12: qty 6

## 2014-06-12 MED ORDER — HYDROCODONE-ACETAMINOPHEN 5-325 MG PO TABS: 1.0000 | ORAL_TABLET | Freq: Four times a day (QID) | ORAL | Status: DC | PRN

## 2014-06-12 MED ORDER — PROPOFOL 10 MG/ML IV EMUL
INTRAVENOUS | Status: AC
  Filled 2014-06-12: qty 100

## 2014-06-12 MED ORDER — MIDAZOLAM HCL 2 MG/2ML IJ SOLN: 1.0000 mg | INTRAMUSCULAR | Status: DC | PRN

## 2014-06-12 MED ORDER — CEFAZOLIN SODIUM-DEXTROSE 2-3 GM-% IV SOLR
2.0000 g | INTRAVENOUS | Status: AC
  Administered 2014-06-12: 2 g via INTRAVENOUS

## 2014-06-12 MED ORDER — MIDAZOLAM HCL 2 MG/2ML IJ SOLN
INTRAMUSCULAR | Status: AC
  Filled 2014-06-12: qty 2

## 2014-06-12 SURGICAL SUPPLY — 53 items
BLADE MINI RND TIP GREEN BEAV (BLADE) IMPLANT
BLADE SURG 15 STRL LF DISP TIS (BLADE) ×2 IMPLANT
BLADE SURG 15 STRL SS (BLADE) ×3
BNDG CMPR 9X4 STRL LF SNTH (GAUZE/BANDAGES/DRESSINGS) ×2
BNDG COHESIVE 3X5 TAN STRL LF (GAUZE/BANDAGES/DRESSINGS) ×4 IMPLANT
BNDG ESMARK 4X9 LF (GAUZE/BANDAGES/DRESSINGS) ×3 IMPLANT
BNDG GAUZE ELAST 4 BULKY (GAUZE/BANDAGES/DRESSINGS) ×4 IMPLANT
CHLORAPREP W/TINT 26ML (MISCELLANEOUS) ×3 IMPLANT
CORDS BIPOLAR (ELECTRODE) ×3 IMPLANT
COVER MAYO STAND STRL (DRAPES) ×3 IMPLANT
COVER TABLE BACK 60X90 (DRAPES) ×3 IMPLANT
CUFF TOURNIQUET SINGLE 18IN (TOURNIQUET CUFF) ×3 IMPLANT
DECANTER SPIKE VIAL GLASS SM (MISCELLANEOUS) IMPLANT
DRAPE EXTREMITY T 121X128X90 (DRAPE) ×3 IMPLANT
DRAPE SURG 17X23 STRL (DRAPES) ×3 IMPLANT
DRSG PAD ABDOMINAL 8X10 ST (GAUZE/BANDAGES/DRESSINGS) ×4 IMPLANT
GAUZE SPONGE 4X4 12PLY STRL (GAUZE/BANDAGES/DRESSINGS) ×3 IMPLANT
GAUZE SPONGE 4X4 16PLY XRAY LF (GAUZE/BANDAGES/DRESSINGS) IMPLANT
GAUZE XEROFORM 1X8 LF (GAUZE/BANDAGES/DRESSINGS) ×3 IMPLANT
GLOVE BIO SURGEON STRL SZ7.5 (GLOVE) ×3 IMPLANT
GLOVE BIOGEL PI IND STRL 7.0 (GLOVE) IMPLANT
GLOVE BIOGEL PI IND STRL 8 (GLOVE) IMPLANT
GLOVE BIOGEL PI IND STRL 8.5 (GLOVE) ×2 IMPLANT
GLOVE BIOGEL PI INDICATOR 7.0 (GLOVE) ×2
GLOVE BIOGEL PI INDICATOR 8 (GLOVE) ×2
GLOVE BIOGEL PI INDICATOR 8.5 (GLOVE) ×1
GLOVE ECLIPSE 7.0 STRL STRAW (GLOVE) ×1 IMPLANT
GLOVE SURG ORTHO 8.0 STRL STRW (GLOVE) ×3 IMPLANT
GOWN STRL REUS W/ TWL LRG LVL3 (GOWN DISPOSABLE) ×2 IMPLANT
GOWN STRL REUS W/TWL LRG LVL3 (GOWN DISPOSABLE) ×6
GOWN STRL REUS W/TWL XL LVL3 (GOWN DISPOSABLE) ×5 IMPLANT
LOOP VESSEL MAXI BLUE (MISCELLANEOUS) IMPLANT
NEEDLE 27GAX1X1/2 (NEEDLE) ×1 IMPLANT
NS IRRIG 1000ML POUR BTL (IV SOLUTION) ×3 IMPLANT
PACK BASIN DAY SURGERY FS (CUSTOM PROCEDURE TRAY) ×3 IMPLANT
PAD CAST 3X4 CTTN HI CHSV (CAST SUPPLIES) ×2 IMPLANT
PAD CAST 4YDX4 CTTN HI CHSV (CAST SUPPLIES) ×2 IMPLANT
PADDING CAST ABS 4INX4YD NS (CAST SUPPLIES) ×1
PADDING CAST ABS COTTON 4X4 ST (CAST SUPPLIES) ×2 IMPLANT
PADDING CAST COTTON 3X4 STRL (CAST SUPPLIES) ×3
PADDING CAST COTTON 4X4 STRL (CAST SUPPLIES) ×3
SLEEVE SCD COMPRESS KNEE MED (MISCELLANEOUS) ×3 IMPLANT
SPLINT PLASTER CAST XFAST 3X15 (CAST SUPPLIES) IMPLANT
SPLINT PLASTER XTRA FASTSET 3X (CAST SUPPLIES)
STOCKINETTE 4X48 STRL (DRAPES) ×3 IMPLANT
SUT VIC AB 2-0 SH 27 (SUTURE) ×3
SUT VIC AB 2-0 SH 27XBRD (SUTURE) ×2 IMPLANT
SUT VICRYL 4-0 PS2 18IN ABS (SUTURE) ×2 IMPLANT
SUT VICRYL RAPIDE 4/0 PS 2 (SUTURE) ×4 IMPLANT
SYR BULB 3OZ (MISCELLANEOUS) ×3 IMPLANT
SYR CONTROL 10ML LL (SYRINGE) ×3 IMPLANT
TOWEL OR 17X24 6PK STRL BLUE (TOWEL DISPOSABLE) ×4 IMPLANT
UNDERPAD 30X30 INCONTINENT (UNDERPADS AND DIAPERS) ×3 IMPLANT

## 2014-06-12 NOTE — Transfer of Care (Signed)
Immediate Anesthesia Transfer of Care Note  Patient: [name]  Procedure(s) Performed: Procedure(s): RIGHT CARPAL TUNNEL RELEASE  (Right) RIGHT DECOMPRESSION/POSSIBLE TRANSPOSITION ULNAR NERVE RIGHT ELBOW (Right)  Patient Location: PACU  Anesthesia Type:General  Level of Consciousness: awake, sedated and responds to stimulation  Airway & Oxygen Therapy: Patient Spontanous Breathing and Patient connected to face mask oxygen  Post-op Assessment: Report given to PACU RN and Post -op Vital signs reviewed and stable  Post vital signs: Reviewed and stable  Complications: No apparent anesthesia complications

## 2014-06-12 NOTE — Op Note (Signed)
Dictation Number [id number]

## 2014-06-12 NOTE — Anesthesia Preprocedure Evaluation (Addendum)
Anesthesia Evaluation  Patient identified by MRN, date of birth, ID band Patient awake    Reviewed: Allergy & Precautions, H&P , NPO status , Patient's Chart, lab work & pertinent test results, reviewed documented beta blocker date and time   Airway Mallampati: II TM Distance: >3 FB Neck ROM: Full    Dental no notable dental hx. (+) Teeth Intact, Dental Advisory Given   Pulmonary neg pulmonary ROS,  breath sounds clear to auscultation  Pulmonary exam normal       Cardiovascular hypertension, Pt. on medications and Pt. on home beta blockers + CAD negative cardio ROS  Rhythm:Regular Rate:Normal     Neuro/Psych negative neurological ROS  negative psych ROS   GI/Hepatic negative GI ROS, Neg liver ROS,   Endo/Other  negative endocrine ROSdiabetes, Type 2, Oral Hypoglycemic Agents  Renal/GU negative Renal ROS  negative genitourinary   Musculoskeletal   Abdominal   Peds  Hematology negative hematology ROS (+)   Anesthesia Other Findings   Reproductive/Obstetrics negative OB ROS                          Anesthesia Physical Anesthesia Plan  ASA: III  Anesthesia Plan: General   Post-op Pain Management:    Induction: Intravenous  Airway Management Planned: LMA  Additional Equipment:   Intra-op Plan:   Post-operative Plan: Extubation in OR  Informed Consent: I have reviewed the patients History and Physical, chart, labs and discussed the procedure including the risks, benefits and alternatives for the proposed anesthesia with the patient or authorized representative who has indicated his/her understanding and acceptance.   Dental advisory given  Plan Discussed with: CRNA and Surgeon  Anesthesia Plan Comments:        Anesthesia Quick Evaluation

## 2014-06-12 NOTE — Op Note (Signed)
[name], [name] NO.:  [id number]  MEDICAL RECORD NO.:  [id number]  LOCATION:                                 FACILITY:  PHYSICIAN:  [name], [name]            DATE OF BIRTH:  DATE OF PROCEDURE:  06/12/2014 DATE OF DISCHARGE:                              OPERATIVE REPORT   PREOPERATIVE DIAGNOSES:  Carpal tunnel syndrome, right wrist.  Cubital tunnel syndrome, right elbow.  POSTOPERATIVE DIAGNOSES:  Carpal tunnel syndrome, right wrist.  Cubital tunnel syndrome, right elbow.  OPERATION:  Decompression, median nerve at the wrist; decompression, ulnar nerve at the elbow, right side.  SURGEON:  [name], MD.  ASSISTANT:  [name], MD  ANESTHESIA:  General with local infiltration.  ANESTHESIOLOGIST:  [name], MD.  HISTORY:  The patient is an 80 year old male with a history of carpal tunnel syndrome, cubital tunnel syndrome bilaterally.  He has undergone release on the left side.  His nerve conductions are positive with significant atrophy to the thenar intrinsics but he is desirous of proceeding to have the right side both median and ulnar nerve released with the hopes of improving sensation.  Pre, peri and postoperative course have been discussed along with risks and complications.  He is aware that there is no guarantee with the surgery, possibility of infection, recurrence of injury to arteries, nerves, tendons, incomplete relief of symptoms, and dystrophy.  In the preoperative area, the patient is seen, the extremity marked by both patient and surgeon. Antibiotic given.  DESCRIPTION OF PROCEDURE:  The patient was brought to the operating room, where general anesthetic was carried out without difficulty.  He was prepped using ChloraPrep, supine position, with the right arm free. A 3-minute dry time was allowed.  Time-out taken, confirming the patient and procedure.  The limb was exsanguinated with an Esmarch bandage. Tourniquet  placed high and the arm was inflated to 250 mmHg.  A longitudinal incision was made in the right palm, carried down through subcutaneous tissue.  Bleeders were electrocauterized.  Palmar fascia was split.  Superficial palmar arch identified.  The flexor tendon to the ring and little finger identified.  To the ulnar side of the median nerve, the carpal retinaculum was incised with sharp dissection.  Right angle and Sewall retractors were placed between skin and forearm fascia. The retractors were placed.  Forearm fascia was released for approximately 2 cm proximal to the wrist crease under direct vision. Canal was explored.  The area of compression to the nerve was apparent with an hourglass deformity.  The motor branch entered into the muscle distally.  The wound was copiously irrigated with saline and closed with interrupted 4-0 Vicryl Rapide sutures.  An incision was then made just obliquely across the medial epicondyle of the right elbow, carried down through subcutaneous tissue.  Bleeders were again electrocauterized. Posterior branches of the medial, antebrachial and cutaneous nerve of the forearm were identified.  The Osborne fascia was released on its posterior aspect.  The subcutaneous tissue was separated from the fascia of the flexor carpi ulnaris.  __________ retractors were placed.  The fascia was then split longitudinally for approximately 6-7  cm distally. A retractor was then used to separate the 2 muscle bellies of the flexor carpi ulnaris.  The deep fascia was noted.  A KMI guide for carpal tunnel release was then placed in the groove, was then inserted a straight ENT scissors with 90 degrees.  This allowed visualization of the canal and cutting of the deep fascia.  The ulnar nerve was intact over its entire course.  Attention was then placed proximally.  The brachial fascia was then separated from the overlying subcutaneous tissue.  The __________ retractors placed.  KMI  guide was then placed between the ulnar nerve and deep fascia.  The deep fascia was released to the level of the arcade of Struthers.  Bleeders were electrocauterized throughout the procedure with bipolar both proximally and distally.  Wound was copiously irrigated with saline.  The Osborne fascia was then sutured to the posterior skin flap with figure-of-eight 2-0 Vicryl sutures, subcutaneous tissue closed with interrupted 2-0 Vicryl, and the skin with interrupted 4-0 Vicryl Rapide.  Local infiltration with 0.25% bupivacaine without epinephrine was given proximally and distally, approximately 10 mL was used.  Sterile compressive dressing from the fingertips to the axilla was applied.  On deflation of the tourniquet, all fingers immediately pinked.  He was taken to the recovery room for observation in satisfactory condition. He will be discharged home to [facility] 1 week on Vicodin.          ______________________________ [name], [name]     GK/MEDQ  D:  06/12/2014  T:  06/12/2014  Job:  [id number]

## 2014-06-12 NOTE — Anesthesia Postprocedure Evaluation (Signed)
  Anesthesia Post-op Note  Patient: [name]  Procedure(s) Performed: Procedure(s): RIGHT CARPAL TUNNEL RELEASE  (Right) DECOMPRESSION ULNAR NERVE RIGHT ELBOW (Right)  Patient Location: PACU  Anesthesia Type:General  Level of Consciousness: awake and alert   Airway and Oxygen Therapy: Patient Spontanous Breathing  Post-op Pain: none  Post-op Assessment: Post-op Vital signs reviewed, Patient's Cardiovascular Status Stable and Respiratory Function Stable  Post-op Vital Signs: Reviewed  Filed Vitals:   06/12/14 1230  BP: 144/63  Pulse: 71  Temp:   Resp: 12    Complications: No apparent anesthesia complications

## 2014-06-12 NOTE — Anesthesia Procedure Notes (Signed)
Procedure Name: LMA Insertion Date/Time: 06/12/2014 11:04 AM Performed by: Melynda Ripple D Pre-anesthesia Checklist: Patient identified, Emergency Drugs available, Suction available and Patient being monitored Patient Re-evaluated:Patient Re-evaluated prior to inductionOxygen Delivery Method: Circle System Utilized Preoxygenation: Pre-oxygenation with 100% oxygen Intubation Type: IV induction Ventilation: Mask ventilation without difficulty LMA: LMA inserted LMA Size: 4.0 Number of attempts: 1 Airway Equipment and Method: bite block Placement Confirmation: positive ETCO2 Tube secured with: Tape Dental Injury: Teeth and Oropharynx as per pre-operative assessment

## 2014-06-12 NOTE — Brief Op Note (Signed)
06/12/2014  11:52 AM  PATIENT:  [name]  80 y.o. male  PRE-OPERATIVE DIAGNOSIS:  CARPAL TUNNEL RIGHT/CUBITAL TUNNEL RIGHT   POST-OPERATIVE DIAGNOSIS:  CARPAL TUNNEL RIGHT/CUBITAL TUNNEL RIGHT   PROCEDURE:  Procedure(s): RIGHT CARPAL TUNNEL RELEASE  (Right) RIGHT DECOMPRESSION/POSSIBLE TRANSPOSITION ULNAR NERVE RIGHT ELBOW (Right)  SURGEON:  Surgeon(s) and Role:    * Wynonia Sours, MD - Primary    * Tennis Must, MD - Assisting  PHYSICIAN ASSISTANT:   ASSISTANTS: K Arlo Buffone,MD   ANESTHESIA:   local and general  EBL:  Total I/O In: 1000 [I.V.:1000] Out: -   BLOOD ADMINISTERED:none  DRAINS: none   LOCAL MEDICATIONS USED:  BUPIVICAINE   SPECIMEN:  No Specimen  DISPOSITION OF SPECIMEN:  N/A  COUNTS:  YES  TOURNIQUET:   Total Tourniquet Time Documented: Upper Arm (Right) - 31 minutes Total: Upper Arm (Right) - 31 minutes   DICTATION: .Other Dictation: Dictation Number [id number]  PLAN OF CARE: Discharge to home after PACU  PATIENT DISPOSITION:  PACU - hemodynamically stable.

## 2014-06-12 NOTE — H&P (Signed)
Mr. Tuberville is an 80 year-old right-hand dominant male who is complaining of decrease in grip and strength, numbness and tingling of his right hand.  He is status post left carpal tunnel, cubital tunnel decompression, approximately three years ago.  He has history of diabetes, high blood pressure and Guillain Barr diagnosed at [facility] in 2014.  He complains of all of his fingers being numb with constant severe aching numbness, he states it is getting worse.  Sleep is interrupted by his hand being numb and tingly on a regular basis.  His nerve conductions are repeated revealing a carpal tunnel syndrome with a motor delay of 8.3, marked decreased amplitude, marked decreased conduction velocity to 27 on his elbow, changes in the ulnar nerve at his wrist both median and ulnar nerves.  ALLERGIES:    He does not have a list of allergies, he will fax this - in 2012 he did not have any allergies reported.  MEDICATIONS:    Verapamil, aspirin, simvastatin, cortivazol.    SURGICAL HISTORY:    Patent ductus arteriosus, hand surgery, elbow surgery  FAMILY MEDICAL HISTORY:   Negative.  SOCIAL HISTORY:    He does not smoke or drink. He is married, retired.   REVIEW OF SYSTEMS:    Positive for glasses, high blood pressure, blood clots, nervousness, easy bruising, otherwise negative.   DAQUANE AGUILAR is an 80 y.o. male.   Chief Complaint: CTS right Cubital tunnel right elbow HPI: see above  Past Medical History  Diagnosis Date  . Hypertension   . Diabetes mellitus without complication   . High cholesterol   . Cancer     11 yrs ago-prostate-seeds  . Guillain-Barre syndrome 7/14    sent to [facility]  . Coronary artery disease     Past Surgical History  Procedure Laterality Date  . Open heart surgery      1951-age 16-patent ductus  . Knee arthroscopy  1962    left  . Carpal tunnel release  2012    left  . Colonoscopy    . Video assisted thoracoscopy (vats)/thorocotomy  8/14   left-guillian-barre    History reviewed. No pertinent family history. Social History:  reports that he has never smoked. He does not have any smokeless tobacco history on file. He reports that he does not drink alcohol or use illicit drugs.  Allergies:  Allergies  Allergen Reactions  . Ativan [Lorazepam]     hallucinations    No prescriptions prior to admission    No results found for this or any previous visit (from the past 48 hour(s)).  No results found.   Pertinent items are noted in HPI.  Height 5\' 9"  (1.753 m), weight 86.183 kg (190 lb).  General appearance: alert, cooperative and appears stated age Head: Normocephalic, without obvious abnormality, atraumatic Neck: no JVD Resp: clear to auscultation bilaterally Cardio: regular rate and rhythm, S1, S2 normal, no murmur, click, rub or gallop GI: soft, non-tender; bowel sounds normal; no masses,  no organomegaly Extremities: numbness and tingling right limited shoulder motion Pulses: 2+ and symmetric Skin: Skin color, texture, turgor normal. No rashes or lesions Neurologic: Grossly normal Incision/Wound: na  Assessment/Plan RECOMMENDATIONS/PLAN:   He would like to have the carpal tunnel released along with the ulnar nerve, he does not have full supination which will present some problem with actual performance of the surgery.  He is aware that it will probably not change his motor component, hopefully, it will improve his sensibility.  He is,  however, notified that it may not change his sensibility either.  We would recommend release of the carpal canal.  He is aware of the risks and complications including infection, recurrence, injury to arteries, nerves, and tendons.  We would recommend carpal tunnel release.  He would like to have the ulnar nerve done also and we will schedule him for decompression and possible transposition ulnar nerve right elbow, carpal tunnel release right wrist as an outpatient under choice of  anesthesia.  KUZMA,GARY R 06/12/2014, 7:41 AM

## 2014-06-12 NOTE — Discharge Instructions (Addendum)

## 2014-06-13 ENCOUNTER — Encounter (HOSPITAL_BASED_OUTPATIENT_CLINIC_OR_DEPARTMENT_OTHER): Payer: Self-pay | Admitting: Orthopedic Surgery

## 2014-12-18 IMAGING — CT CT CHEST W/ CM
2 of 3 series · 15 of 36 positions shown, 18 images · IV contrast (Omnipaque 300)
Comparison: Chest 06/30/2013.

CLINICAL DATA: Left hilar soft tissue fullness demonstrated on
previous chest radiograph.  Further evaluation.

CT CHEST WITH CONTRAST
TECHNIQUE: Multidetector CT imaging of the chest was performed
following the standard protocol during bolus administration of
intravenous contrast.
Contrast: 100mL OMNIPAQUE IOHEXOL 300 MG/ML  SOLN

[Series 2: chestroutine 5.0 b40f · axial · 0.66mm/px · z∈[+877,+1137]mm · 12 of 62 slices shown, 15 images]
[im 5/62  mediastinal]
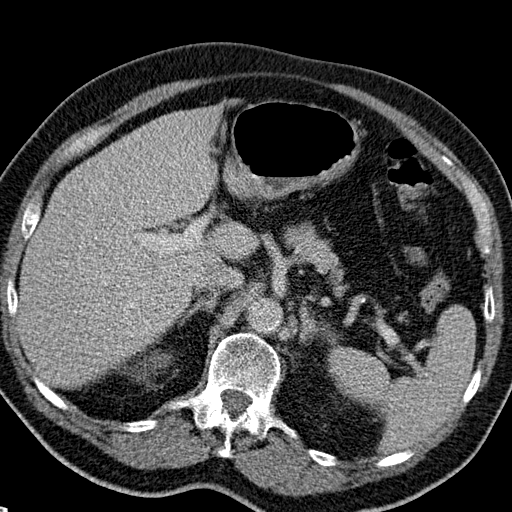
[im 5/62  lung]
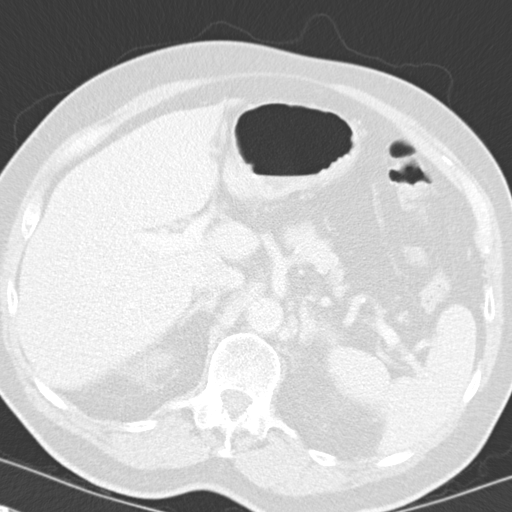
[im 10/62  lung]
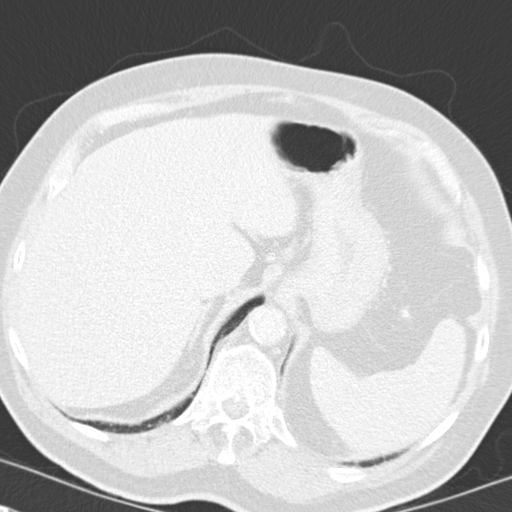
[im 14/62  lung]
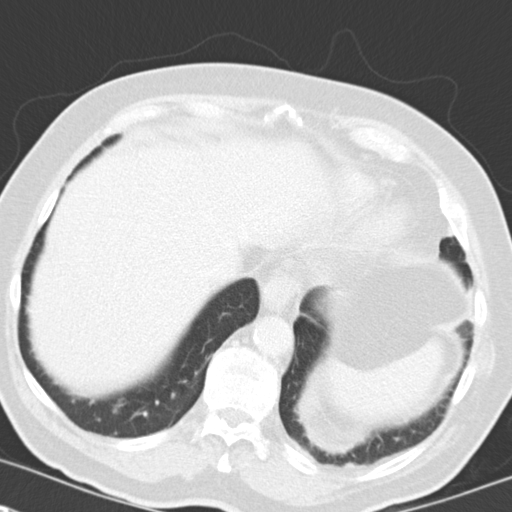
[im 19/62  lung]
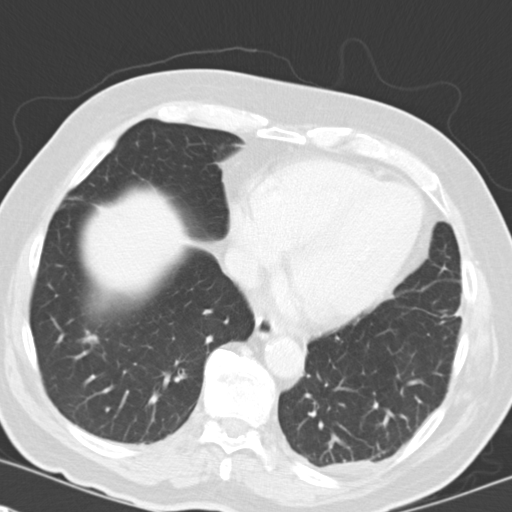
[im 23/62  mediastinal]
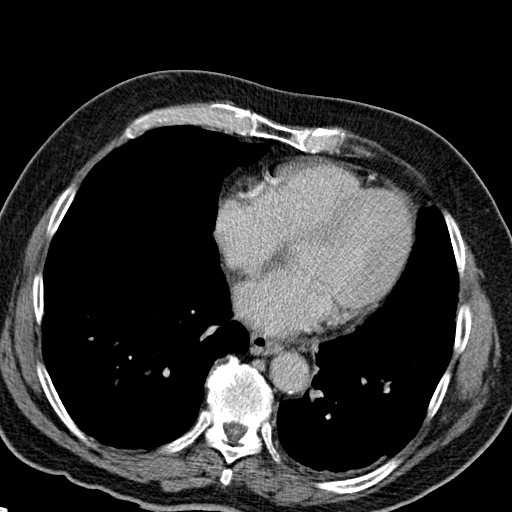
[im 23/62  lung]
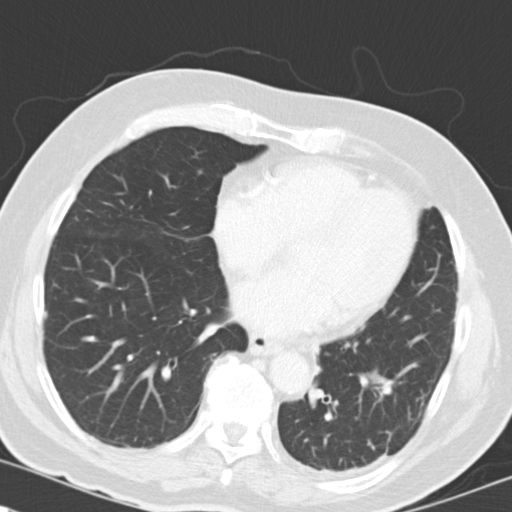
[im 28/62  lung]
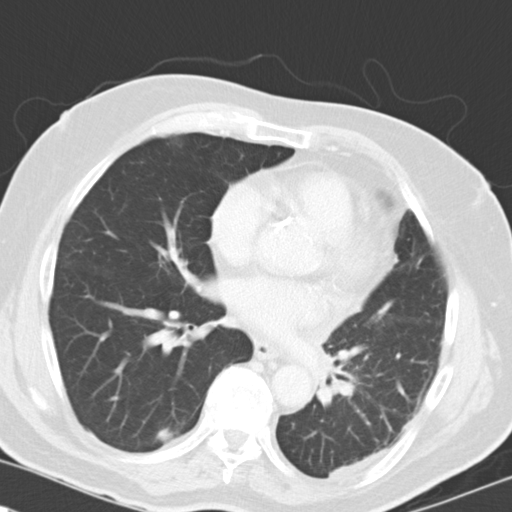
[im 34/62  lung]
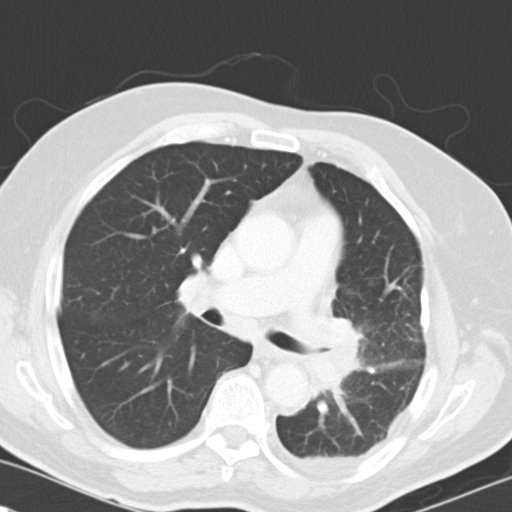
[im 39/62  lung]
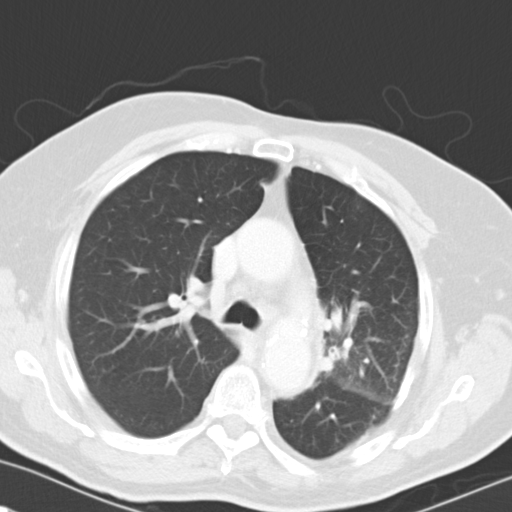
[im 43/62  mediastinal]
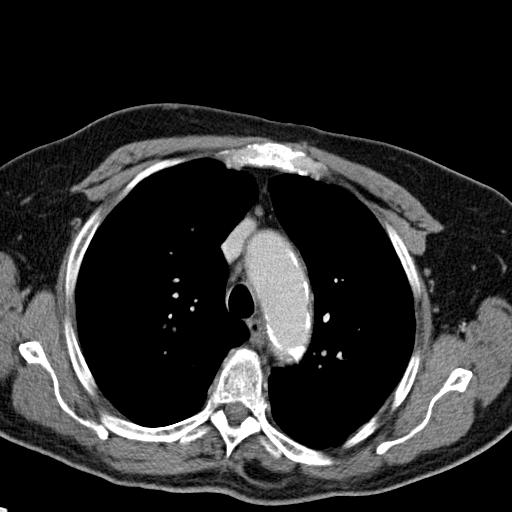
[im 43/62  lung]
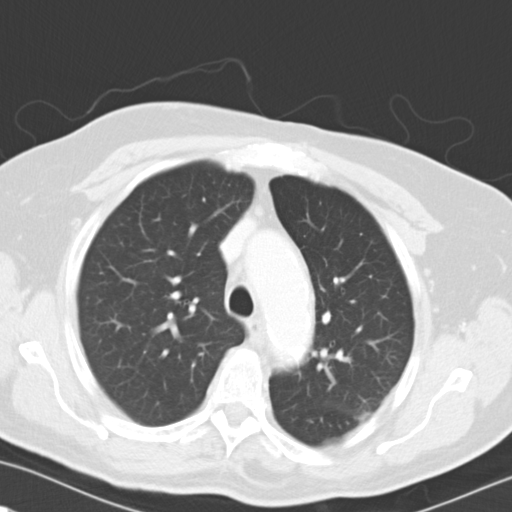
[im 48/62  lung]
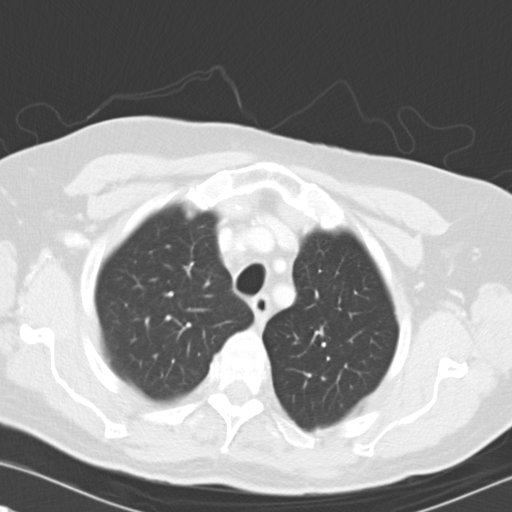
[im 52/62  lung]
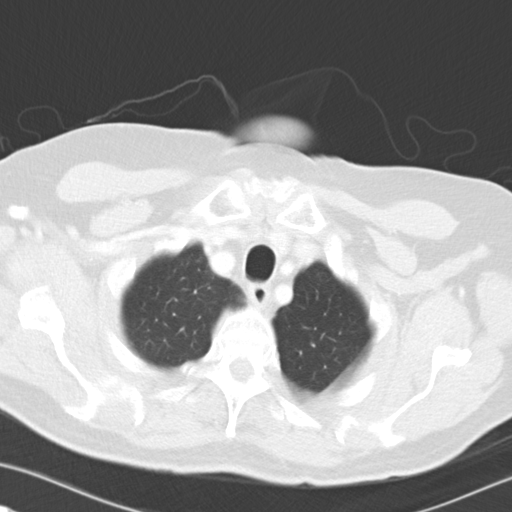
[im 57/62  lung]
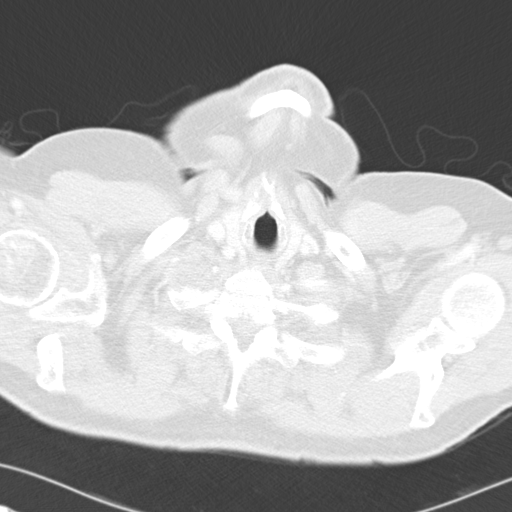

[Series 4: mpr coronal chest 3mm · coronal · 0.62mm/px · 3 of 113 slices shown]
[im 23/113  lung]
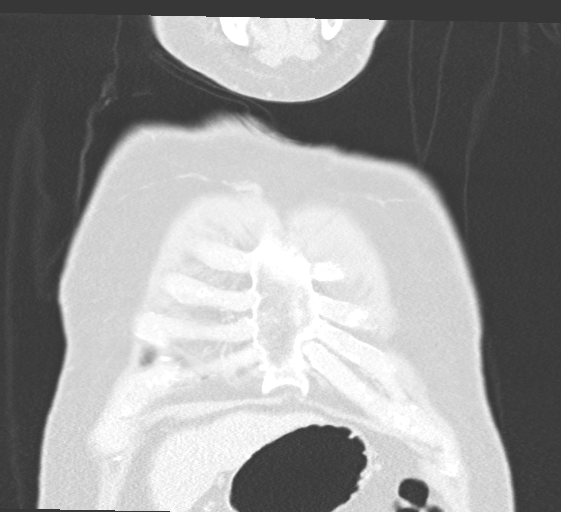
[im 45/113  lung]
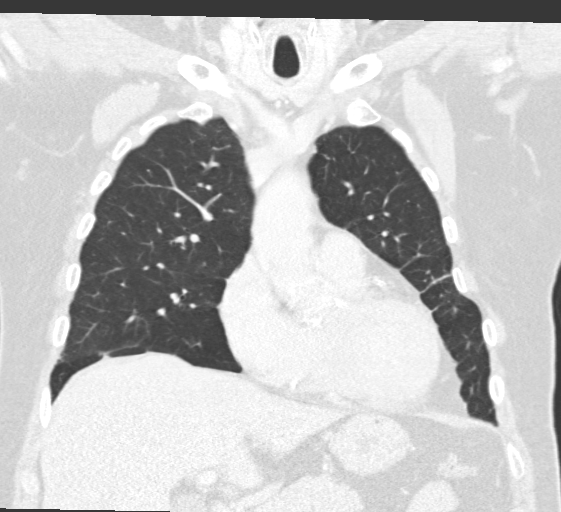
[im 68/113  lung]
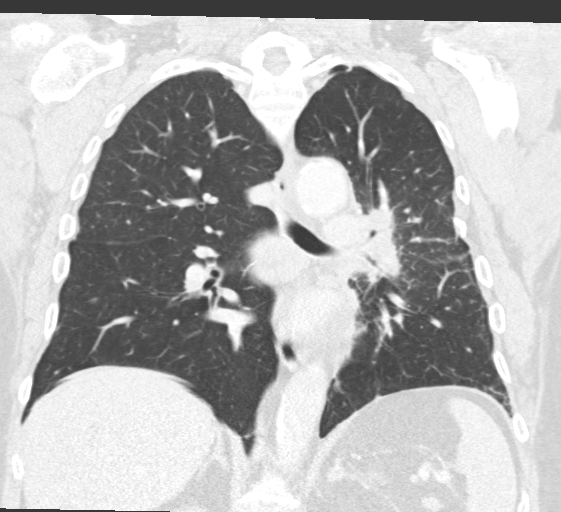

[15 of 36 positions shown; findings below may reference images not displayed]

FINDINGS: Left hilar mass causing some narrowing of the left upper
lung bronchus with mild postobstructive change in the lingula.
This may represent a focal mass or confluent lymphadenopathy.  The
structure is measured at about 2.2 x 2.7 cm.  There is additional
lymphadenopathy in the subcarinal, pretracheal, and left
parabronchial regions.  Subcarinal lymph nodes measure 1.3 x
cm.  Changes could be due to lymphoma or metastasis.  No
parenchymal nodules demonstrated in the left lung.  There is a
focal ground-glass nodule in the right lower lung posteriorly
measuring about 1.2 cm diameter.  This is indeterminate and could
represent inflammatory nodule or neoplastic change.  There is focal
pleural thickening along the left posterior lower chest wall.  This
also could represent inflammatory or neoplastic change.

Normal heart size.  Coronary artery calcification.  Normal caliber
thoracic aorta with calcification.  The esophagus is decompressed.
No pleural effusions.  Visualized portions of the upper abdominal
organs are grossly unremarkable.  Suggestion of sub centimeter cyst
in the liver, incompletely evaluated.  Small esophageal hiatal
hernia.  Emphysematous changes in the lungs.  No pneumothorax.  The
diffuse degenerative changes throughout the thoracic spine.  No
destructive bone lesions appreciated.  Deformity of the left fifth
rib posteriorly is likely postoperative.
IMPRESSION: Left hilar mass/lymphadenopathy causing narrowing of the left upper
lobe bronchus.  Mild postobstructive change in the left lingula.
Additional left peribronchial and mediastinal lymphadenopathy is
also identified.  Left-sided pleural thickening.  Focal ground-
glass nodule in the right lung.  Findings may represent lymphoma,
primary neoplasm, or metastasis.

## 2015-01-17 IMAGING — CR DG HIP (WITH OR WITHOUT PELVIS) 2-3V*L*
3 series · 3 of 3 positions shown · non-contrast
Comparison: Pelvic CT 06/28/2010.

CLINICAL DATA: Left hip pain.  History of gout and falls.

LEFT HIP - COMPLETE 2+ VIEW

[view not recorded (1 of 3)]
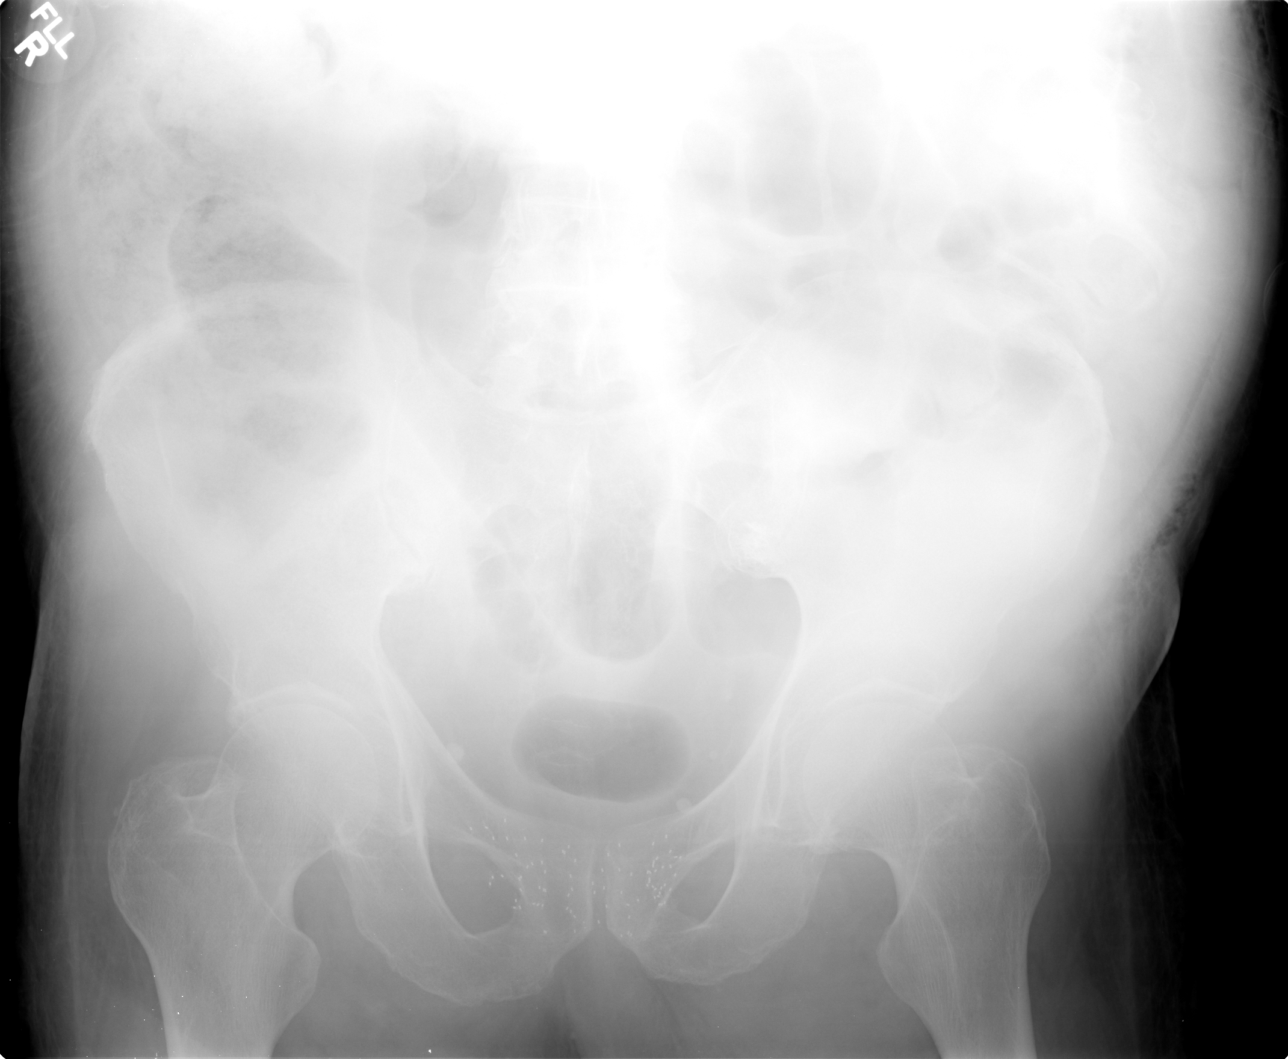

[view not recorded (2 of 3)]
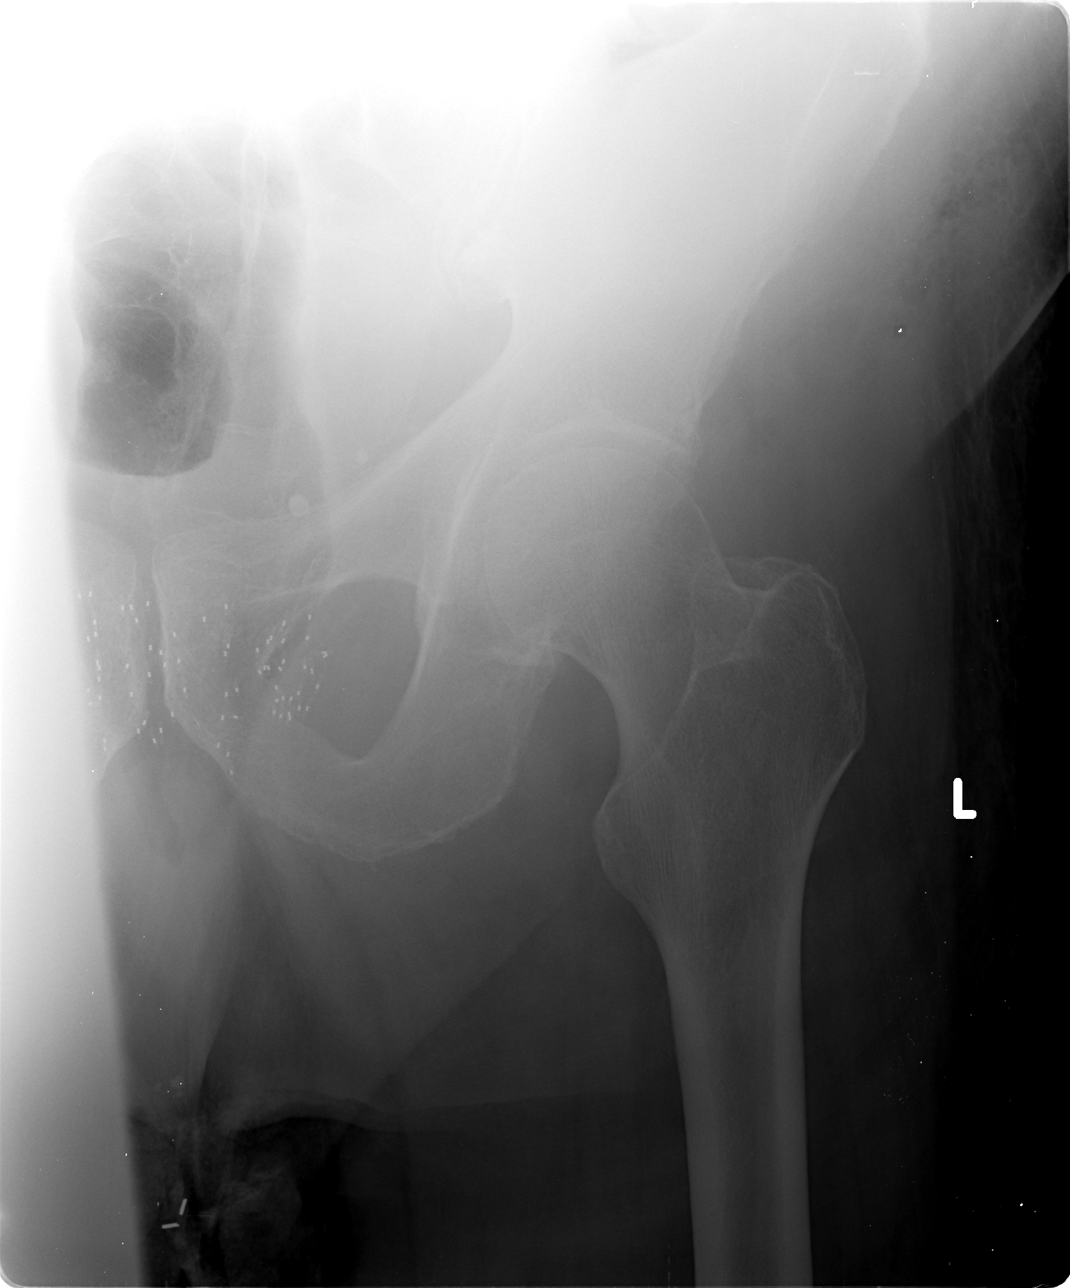

[view not recorded (3 of 3)]
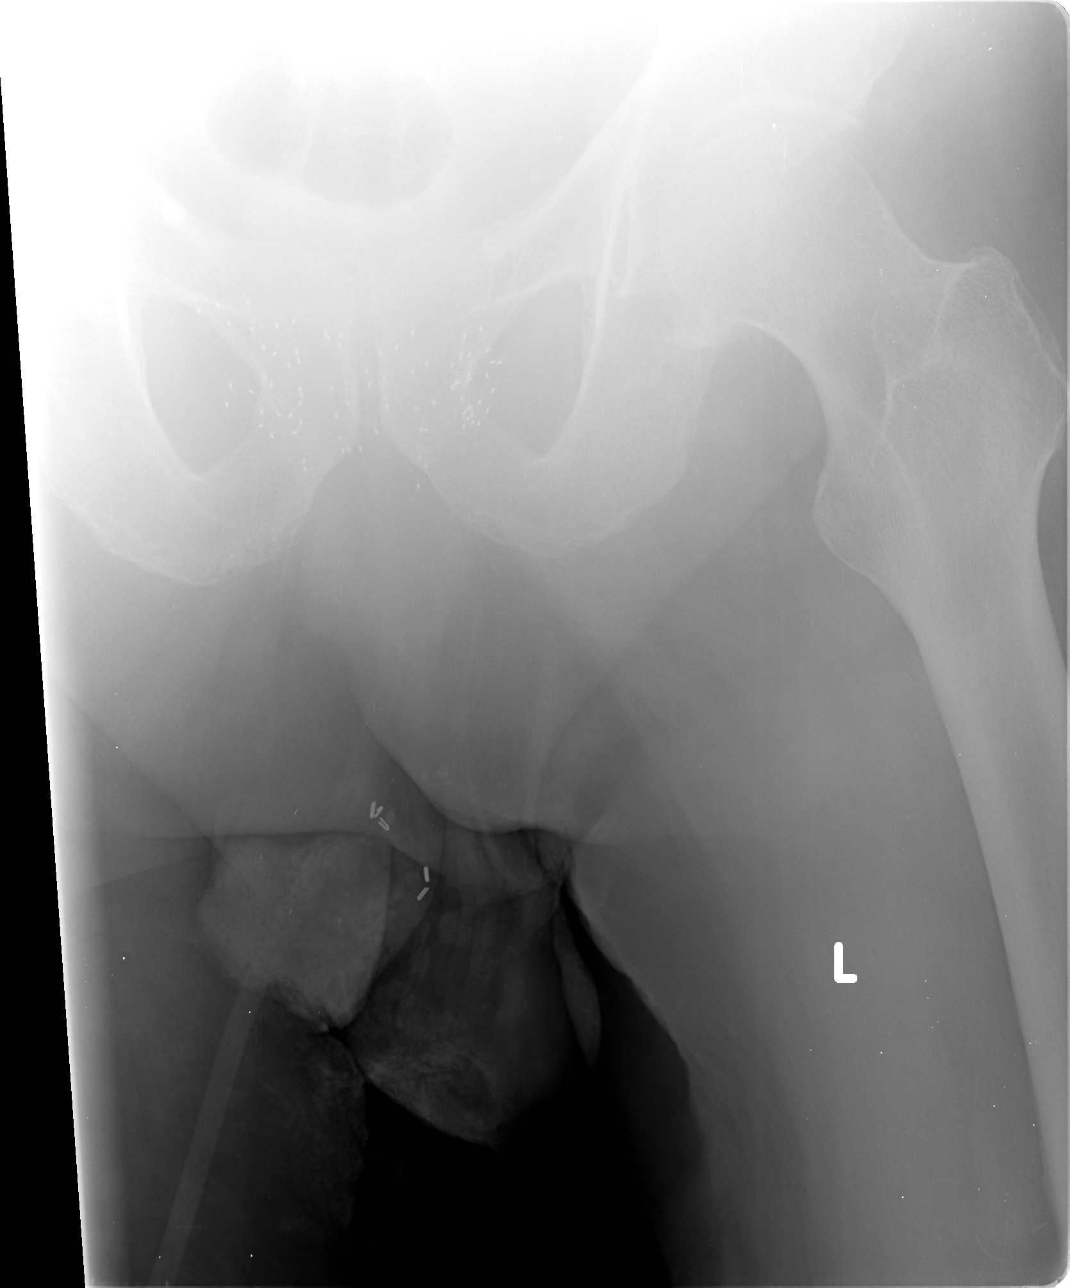

[3 of 3 positions shown; findings below may reference images not displayed]

FINDINGS: Detail is limited by body habitus.  The mineralization
and alignment are normal.  There is no evidence of acute fracture,
dislocation or avascular necrosis.  The hip joint spaces appear
preserved.  Prostate brachytherapy seeds are noted.  There appear
to be some asymmetric lucencies within the subcutaneous fat of the
left lower abdominal wall. These are suspicious for soft tissue
emphysema. The bowel in the pelvis is mildly distended without
evidence of obstruction.
IMPRESSION: 1.  No acute osseous findings or evidence of metastatic disease.
2.  Nonspecific lucencies within the left anterior abdominal wall
subcutaneous fat, suggesting possible soft tissue emphysema.  This
could be related to subcutaneous injections.

Results were discussed by telephone with Dr. Roberto.

## 2015-09-17 ENCOUNTER — Encounter (INDEPENDENT_AMBULATORY_CARE_PROVIDER_SITE_OTHER): Payer: Self-pay | Admitting: *Deleted

## 2016-10-07 NOTE — Patient Instructions (Signed)
[name]  10/07/2016     @PREFPERIOPPHARMACY @   Your procedure is scheduled on 10/13/2016.  Report to [facility] at 8:30 A.M.  Call this number if you have problems the morning of surgery:  [phone]   Remember:  Do not eat food or drink liquids after midnight.  Take these medicines the morning of surgery with A SIP OF WATER : COREG, [place]   Do not wear jewelry, make-up or nail polish.  Do not wear lotions, powders, or perfumes, or deoderant.  Do not shave 48 hours prior to surgery.  Men may shave face and neck.  Do not bring valuables to the hospital.  [facility] is not responsible for any belongings or valuables.  Contacts, dentures or bridgework may not be worn into surgery.  Leave your suitcase in the car.  After surgery it may be brought to your room.  For patients admitted to the hospital, discharge time will be determined by your treatment team.  Patients discharged the day of surgery will not be allowed to drive home.   Name and phone number of your driver:   FAMILY Special instructions:  N/A  Please read over the following fact sheets that you were given. Care and Recovery After Surgery      A cataract is a clouding of the lens of the eye. When a lens becomes cloudy, vision is reduced based on the degree and nature of the clouding. Surgery may be needed to improve vision. Surgery removes the cloudy lens and usually replaces it with a substitute lens (intraocular lens, IOL). LET YOUR EYE DOCTOR KNOW ABOUT:  Allergies to food or medicine.  Medicines taken including herbs, eye drops, over-the-counter medicines, and creams.  Use of steroids (by mouth or creams).  Previous problems with anesthetics or numbing medicine.  History of bleeding problems or blood clots.  Previous surgery.  Other health problems, including diabetes and kidney problems.  Possibility of pregnancy, if this applies. RISKS AND  COMPLICATIONS  Infection.  Inflammation of the eyeball (endophthalmitis) that can spread to both eyes (sympathetic ophthalmia).  Poor wound healing.  If an IOL is inserted, it can later fall out of proper position. This is very uncommon.  Clouding of the part of your eye that holds an IOL in place. This is called an "after-cataract." These are uncommon but easily treated. BEFORE THE PROCEDURE  Do not eat or drink anything except small amounts of water for 8 to 12 before your surgery, or as directed by your caregiver.  Unless you are told otherwise, continue any eye drops you have been prescribed.  Talk to your primary caregiver about all other medicines that you take (both prescription and nonprescription). In some cases, you may need to stop or change medicines near the time of your surgery. This is most important if you are taking blood-thinning medicine.Do not stop medicines unless you are told to do so.  Arrange for someone to drive you to and from the procedure.  Do not put contact lenses in either eye on the day of your surgery. PROCEDURE There is more than one method for safely removing a cataract. Your doctor can explain the differences and help determine which is best for you. Phacoemulsification surgery is the most common form of cataract surgery.  An injection is given behind the eye or eye drops are given to make this a painless procedure.  A small cut (incision) is made on the edge of the clear, dome-shaped  surface that covers the front of the eye (cornea).  A tiny probe is painlessly inserted into the eye. This device gives off ultrasound waves that soften and break up the cloudy center of the lens. This makes it easier for the cloudy lens to be removed by suction.  An IOL may be implanted.  The normal lens of the eye is covered by a clear capsule. Part of that capsule is intentionally left in the eye to support the IOL.  Your surgeon may or may not use stitches to  close the incision. There are other forms of cataract surgery that require a larger incision and stitches to close the eye. This approach is taken in cases where the doctor feels that the cataract cannot be easily removed using phacoemulsification. AFTER THE PROCEDURE  When an IOL is implanted, it does not need care. It becomes a permanent part of your eye and cannot be seen or felt.  Your doctor will schedule follow-up exams to check on your progress.  Review your other medicines with your doctor to see which can be resumed after surgery.  Use eye drops or take medicine as prescribed by your doctor.   This information is not intended to replace advice given to you by your health care provider. Make sure you discuss any questions you have with your health care provider.   Document Released: 11/26/2011 Document Revised: 12/28/2014 Document Reviewed: 11/26/2011 Elsevier Interactive Patient Education [employer].

## 2016-10-08 ENCOUNTER — Encounter (HOSPITAL_COMMUNITY): Payer: Self-pay

## 2016-10-08 ENCOUNTER — Encounter (HOSPITAL_COMMUNITY)
Admission: RE | Admit: 2016-10-08 | Discharge: 2016-10-08 | Disposition: A | Discharge status: Home / Self Care | Payer: Medicare Other | Source: Ambulatory Visit | Attending: Ophthalmology | Admitting: Ophthalmology

## 2016-10-08 ENCOUNTER — Other Ambulatory Visit: Payer: Self-pay

## 2016-10-08 DIAGNOSIS — H269 Unspecified cataract: Secondary | ICD-10-CM | POA: Diagnosis not present

## 2016-10-08 DIAGNOSIS — Z01812 Encounter for preprocedural laboratory examination: Secondary | ICD-10-CM | POA: Diagnosis not present

## 2016-10-08 HISTORY — DX: Unspecified glaucoma: H40.9

## 2016-10-08 LAB — CBC WITH DIFFERENTIAL/PLATELET
Basophils Absolute: 0.1 10*3/uL (ref 0.0–0.1)
Basophils Relative: 1 %
Eosinophils Absolute: 0.3 10*3/uL (ref 0.0–0.7)
Eosinophils Relative: 3 %
HEMATOCRIT: 36.9 % — AB (ref 39.0–52.0)
HEMOGLOBIN: 12.1 g/dL — AB (ref 13.0–17.0)
LYMPHS ABS: 2.6 10*3/uL (ref 0.7–4.0)
Lymphocytes Relative: 31 %
MCH: 29.4 pg (ref 26.0–34.0)
MCHC: 32.8 g/dL (ref 30.0–36.0)
MCV: 89.8 fL (ref 78.0–100.0)
Monocytes Absolute: 0.7 10*3/uL (ref 0.1–1.0)
Monocytes Relative: 9 %
NEUTROS ABS: 4.8 10*3/uL (ref 1.7–7.7)
NEUTROS PCT: 56 %
Platelets: 253 10*3/uL (ref 150–400)
RBC: 4.11 MIL/uL — AB (ref 4.22–5.81)
RDW: 13 % (ref 11.5–15.5)
WBC: 8.4 10*3/uL (ref 4.0–10.5)

## 2016-10-08 LAB — BASIC METABOLIC PANEL
Anion gap: 9 (ref 5–15)
BUN: 17 mg/dL (ref 6–20)
CHLORIDE: 104 mmol/L (ref 101–111)
CO2: 24 mmol/L (ref 22–32)
Calcium: 8.8 mg/dL — ABNORMAL LOW (ref 8.9–10.3)
Creatinine, Ser: 1.13 mg/dL (ref 0.61–1.24)
GFR calc Af Amer: >60 mL/min (ref >60)
GFR calc non Af Amer: 58 mL/min — ABNORMAL LOW (ref >60)
GLUCOSE: 169 mg/dL — AB (ref 65–99)
POTASSIUM: 4.2 mmol/L (ref 3.5–5.1)
Sodium: 137 mmol/L (ref 135–145)

## 2016-10-13 ENCOUNTER — Encounter (HOSPITAL_COMMUNITY): Payer: Self-pay

## 2016-10-13 ENCOUNTER — Ambulatory Visit (HOSPITAL_COMMUNITY): Payer: Medicare Other | Admitting: Anesthesiology

## 2016-10-13 ENCOUNTER — Ambulatory Visit (HOSPITAL_COMMUNITY)
Admission: RE | Admit: 2016-10-13 | Discharge: 2016-10-13 | Disposition: A | Discharge status: Home / Self Care | Payer: Medicare Other | Source: Ambulatory Visit | Attending: Ophthalmology | Admitting: Ophthalmology

## 2016-10-13 ENCOUNTER — Encounter (HOSPITAL_COMMUNITY)
Admission: RE | Disposition: A | Discharge status: Home / Self Care | Payer: Self-pay | Source: Ambulatory Visit | Attending: Ophthalmology

## 2016-10-13 DIAGNOSIS — Z79899 Other long term (current) drug therapy: Secondary | ICD-10-CM | POA: Insufficient documentation

## 2016-10-13 DIAGNOSIS — E1136 Type 2 diabetes mellitus with diabetic cataract: Secondary | ICD-10-CM | POA: Insufficient documentation

## 2016-10-13 DIAGNOSIS — I2510 Atherosclerotic heart disease of native coronary artery without angina pectoris: Secondary | ICD-10-CM | POA: Insufficient documentation

## 2016-10-13 DIAGNOSIS — I10 Essential (primary) hypertension: Secondary | ICD-10-CM | POA: Diagnosis not present

## 2016-10-13 DIAGNOSIS — Z7982 Long term (current) use of aspirin: Secondary | ICD-10-CM | POA: Insufficient documentation

## 2016-10-13 DIAGNOSIS — Z7984 Long term (current) use of oral hypoglycemic drugs: Secondary | ICD-10-CM | POA: Insufficient documentation

## 2016-10-13 HISTORY — PX: CATARACT EXTRACTION W/PHACO: SHX586

## 2016-10-13 LAB — GLUCOSE, CAPILLARY: Glucose-Capillary: 110 mg/dL — ABNORMAL HIGH (ref 65–99)

## 2016-10-13 SURGERY — PHACOEMULSIFICATION, CATARACT, WITH IOL INSERTION
Anesthesia: Monitor Anesthesia Care | Laterality: Left

## 2016-10-13 MED ORDER — TETRACAINE 0.5 % OP SOLN OPTIME - NO CHARGE
OPHTHALMIC | Status: DC | PRN
  Administered 2016-10-13: 2 [drp] via OPHTHALMIC

## 2016-10-13 MED ORDER — EPINEPHRINE PF 1 MG/ML IJ SOLN
INTRAMUSCULAR | Status: AC
  Filled 2016-10-13: qty 1

## 2016-10-13 MED ORDER — KETOROLAC TROMETHAMINE 0.5 % OP SOLN
1.0000 [drp] | OPHTHALMIC | Status: AC
  Administered 2016-10-13 (×3): 1 [drp] via OPHTHALMIC

## 2016-10-13 MED ORDER — PHENYLEPHRINE HCL 2.5 % OP SOLN
1.0000 [drp] | OPHTHALMIC | Status: AC
  Administered 2016-10-13 (×3): 1 [drp] via OPHTHALMIC

## 2016-10-13 MED ORDER — CYCLOPENTOLATE-PHENYLEPHRINE 0.2-1 % OP SOLN
1.0000 [drp] | OPHTHALMIC | Status: AC
  Administered 2016-10-13 (×3): 1 [drp] via OPHTHALMIC

## 2016-10-13 MED ORDER — MIDAZOLAM HCL 2 MG/2ML IJ SOLN
1.0000 mg | INTRAMUSCULAR | Status: DC | PRN
  Administered 2016-10-13 (×2): 1 mg via INTRAVENOUS

## 2016-10-13 MED ORDER — LACTATED RINGERS IV SOLN
INTRAVENOUS | Status: DC
  Administered 2016-10-13: 09:00:00 via INTRAVENOUS

## 2016-10-13 MED ORDER — FENTANYL CITRATE (PF) 100 MCG/2ML IJ SOLN
INTRAMUSCULAR | Status: AC
  Filled 2016-10-13: qty 2

## 2016-10-13 MED ORDER — BSS IO SOLN
INTRAOCULAR | Status: DC | PRN
  Administered 2016-10-13: 15 mL

## 2016-10-13 MED ORDER — TETRACAINE HCL 0.5 % OP SOLN
1.0000 [drp] | OPHTHALMIC | Status: AC
  Administered 2016-10-13 (×3): 1 [drp] via OPHTHALMIC

## 2016-10-13 MED ORDER — EPINEPHRINE PF 1 MG/ML IJ SOLN
INTRAOCULAR | Status: DC | PRN
  Administered 2016-10-13: 500 mL

## 2016-10-13 MED ORDER — PROVISC 10 MG/ML IO SOLN
INTRAOCULAR | Status: DC | PRN
  Administered 2016-10-13: 0.85 mL via INTRAOCULAR

## 2016-10-13 MED ORDER — MIDAZOLAM HCL 2 MG/2ML IJ SOLN
INTRAMUSCULAR | Status: AC
  Filled 2016-10-13: qty 2

## 2016-10-13 MED ORDER — FENTANYL CITRATE (PF) 100 MCG/2ML IJ SOLN
25.0000 ug | INTRAMUSCULAR | Status: DC | PRN
Start: 2016-10-13 — End: 2016-10-13
  Administered 2016-10-13: 25 ug via INTRAVENOUS

## 2016-10-13 SURGICAL SUPPLY — 9 items
CLOTH BEACON ORANGE TIMEOUT ST (SAFETY) ×1 IMPLANT
EYE SHIELD UNIVERSAL CLEAR (GAUZE/BANDAGES/DRESSINGS) ×1 IMPLANT
GLOVE BIO SURGEON STRL SZ 6.5 (GLOVE) ×1 IMPLANT
GLOVE EXAM NITRILE MD LF STRL (GLOVE) ×1 IMPLANT
PAD ARMBOARD 7.5X6 YLW CONV (MISCELLANEOUS) ×1 IMPLANT
SIGHTPATH CAT PROC W REG LENS (Ophthalmic Related) ×2 IMPLANT
TAPE SURG TRANSPORE 1 IN (GAUZE/BANDAGES/DRESSINGS) IMPLANT
TAPE SURGICAL TRANSPORE 1 IN (GAUZE/BANDAGES/DRESSINGS) ×1
WATER STERILE IRR 250ML POUR (IV SOLUTION) ×1 IMPLANT

## 2016-10-13 NOTE — Transfer of Care (Signed)
Immediate Anesthesia Transfer of Care Note  Patient: [name]  Procedure(s) Performed: Procedure(s) with comments: CATARACT EXTRACTION PHACO AND INTRAOCULAR LENS PLACEMENT LEFT EYE CDE=15.04 (Left) - left  Patient Location: Short Stay  Anesthesia Type:MAC  Level of Consciousness: awake, alert , oriented and patient cooperative  Airway & Oxygen Therapy: Patient Spontanous Breathing  Post-op Assessment: Report given to RN, Post -op Vital signs reviewed and stable and Patient moving all extremities  Post vital signs: Reviewed and stable  Last Vitals:  Vitals:   10/13/16 1000 10/13/16 1005  BP:    Pulse:    Resp: (!) 24 (!) 24  Temp:      Last Pain:  Vitals:   10/13/16 0857  TempSrc: Oral      Patients Stated Pain Goal: 0 ([id number])  Complications: No apparent anesthesia complications

## 2016-10-13 NOTE — Discharge Instructions (Signed)
°  °          [facility] Instructions °[address]- [place] [address] °    ° °1. Avoid closing eyes tightly. One often closes the eye tightly when laughing, talking, sneezing, coughing or if they feel irritated. At these times, you should be careful not to close your eyes tightly. ° °2. Instill eye drops as instructed. To instill drops in your eye, open it, look up and have someone gently pull the lower lid down and instill a couple of drops inside the lower lid. ° °3. Do not touch upper lid. ° °4. Take Advil or Tylenol for pain. ° °5. You may use either eye for near work, such as reading or sewing and you may watch television. ° °6. You may have your hair done at the beauty parlor at any time. ° °7. Wear dark glasses with or without your own glasses if you are in bright light. ° °8. Call our office at [phone] or [phone] if you have sharp pain in your eye or unusual symptoms. ° °9.  FOLLOW UP WITH DR. SHAPIRO TODAY IN HIS [place] OFFICE AT 2:45pm. ° °  °I have received a copy of the above instructions and will follow them.  ° ° ° °IF YOU ARE IN IMMEDIATE DANGER CALL 911! ° °It is important for you to keep your follow-up appointment with your physician after discharge, OR, for you /your caregiver to make a follow-up appointment with your physician / medical provider after discharge. ° °Show these instructions to the next healthcare provider you see. °PATIENT INSTRUCTIONS °POST-ANESTHESIA ° °IMMEDIATELY FOLLOWING SURGERY:  Do not drive or operate machinery for the first twenty four hours after surgery.  Do not make any important decisions for twenty four hours after surgery or while taking narcotic pain medications or sedatives.  If you develop intractable nausea and vomiting or a severe headache please notify your doctor immediately. ° °FOLLOW-UP:  Please make an appointment with your surgeon as instructed. You do not need to follow up with anesthesia unless  specifically instructed to do so. ° °WOUND CARE INSTRUCTIONS (if applicable):  Keep a dry clean dressing on the anesthesia/puncture wound site if there is drainage.  Once the wound has quit draining you may leave it open to air.  Generally you should leave the bandage intact for twenty four hours unless there is drainage.  If the epidural site drains for more than 36-48 hours please call the anesthesia department. ° °QUESTIONS?:  Please feel free to call your physician or the hospital operator if you have any questions, and they will be happy to assist you.    ° ° ° °

## 2016-10-13 NOTE — H&P (Signed)
The patient was re examined and there is no change in the patients condition since the original H and P. 

## 2016-10-13 NOTE — Anesthesia Preprocedure Evaluation (Signed)
Anesthesia Evaluation  Patient identified by MRN, date of birth, ID band Patient awake    Reviewed: Allergy & Precautions, H&P , NPO status , Patient's Chart, lab work & pertinent test results, reviewed documented beta blocker date and time   Airway Mallampati: II  TM Distance: >3 FB Neck ROM: Full    Dental no notable dental hx. (+) Teeth Intact, Dental Advisory Given   Pulmonary neg pulmonary ROS,    Pulmonary exam normal breath sounds clear to auscultation       Cardiovascular hypertension, Pt. on medications and Pt. on home beta blockers + CAD  negative cardio ROS   Rhythm:Regular Rate:Normal     Neuro/Psych  Neuromuscular disease negative neurological ROS  negative psych ROS   GI/Hepatic negative GI ROS, Neg liver ROS,   Endo/Other  diabetes, Type 2, Oral Hypoglycemic Agents  Renal/GU negative Renal ROS  negative genitourinary   Musculoskeletal   Abdominal   Peds  Hematology negative hematology ROS (+)   Anesthesia Other Findings   Reproductive/Obstetrics negative OB ROS                             Anesthesia Physical Anesthesia Plan  ASA: III  Anesthesia Plan: MAC   Post-op Pain Management:    Induction: Intravenous  Airway Management Planned: Nasal Cannula  Additional Equipment:   Intra-op Plan:   Post-operative Plan: Extubation in OR  Informed Consent: I have reviewed the patients History and Physical, chart, labs and discussed the procedure including the risks, benefits and alternatives for the proposed anesthesia with the patient or authorized representative who has indicated his/her understanding and acceptance.     Plan Discussed with:   Anesthesia Plan Comments:         Anesthesia Quick Evaluation

## 2016-10-13 NOTE — Op Note (Signed)
Patient brought to the operating room and prepped and draped in the usual manner.  Lid speculum inserted in left eye.  Stab incision made at the twelve o'clock position.  Provisc instilled in the anterior chamber.   A 2.4 mm. Stab incision was made temporally.  An anterior capsulotomy was done with a bent 25 gauge needle.  The nucleus was hydrodissected.  The Phaco tip was inserted in the anterior chamber and the nucleus was emulsified.  CDE was 15.04.  The cortical material was then removed with the I and A tip.  Posterior capsule was the polished.  The anterior chamber was deepened with Provisc.  A 21.0 Diopter Alcon AU00T0 IOL was then inserted in the capsular bag.  Provisc was then removed with the I and A tip.  The wound was then hydrated.  Patient sent to the Recovery Room in good condition with follow up in my office.  Preoperative Diagnosis:  Nuclear Cataract OS Postoperative Diagnosis:  Same Procedure name: Kelman Phacoemulsification OS with IOL

## 2016-10-13 NOTE — Anesthesia Postprocedure Evaluation (Signed)
Anesthesia Post Note  Patient: [name]  Procedure(s) Performed: Procedure(s) (LRB): CATARACT EXTRACTION PHACO AND INTRAOCULAR LENS PLACEMENT LEFT EYE CDE=15.04 (Left)  Patient location during evaluation: Short Stay Anesthesia Type: MAC Level of consciousness: awake and alert, oriented and patient cooperative Pain management: pain level controlled Vital Signs Assessment: post-procedure vital signs reviewed and stable Respiratory status: spontaneous breathing, nonlabored ventilation and respiratory function stable Cardiovascular status: blood pressure returned to baseline and stable Postop Assessment: no signs of nausea or vomiting Anesthetic complications: no    Last Vitals:  Vitals:   10/13/16 1000 10/13/16 1005  BP:    Pulse:    Resp: (!) 24 (!) 24  Temp:      Last Pain:  Vitals:   10/13/16 0857  TempSrc: Oral                 Batya Citron J

## 2016-10-15 ENCOUNTER — Encounter (HOSPITAL_COMMUNITY): Payer: Self-pay | Admitting: Ophthalmology

## 2016-10-29 ENCOUNTER — Encounter (HOSPITAL_COMMUNITY)
Admission: RE | Admit: 2016-10-29 | Discharge: 2016-10-29 | Disposition: A | Discharge status: Home / Self Care | Payer: Medicare Other | Source: Ambulatory Visit | Attending: Ophthalmology | Admitting: Ophthalmology

## 2016-10-29 ENCOUNTER — Encounter (HOSPITAL_COMMUNITY): Payer: Self-pay

## 2016-11-03 ENCOUNTER — Encounter (HOSPITAL_COMMUNITY): Payer: Self-pay | Admitting: *Deleted

## 2016-11-03 ENCOUNTER — Encounter (HOSPITAL_COMMUNITY)
Admission: RE | Disposition: A | Discharge status: Home / Self Care | Payer: Self-pay | Source: Ambulatory Visit | Attending: Ophthalmology

## 2016-11-03 ENCOUNTER — Ambulatory Visit (HOSPITAL_COMMUNITY): Payer: Medicare Other | Admitting: Anesthesiology

## 2016-11-03 ENCOUNTER — Ambulatory Visit (HOSPITAL_COMMUNITY)
Admission: RE | Admit: 2016-11-03 | Discharge: 2016-11-03 | Disposition: A | Discharge status: Home / Self Care | Payer: Medicare Other | Source: Ambulatory Visit | Attending: Ophthalmology | Admitting: Ophthalmology

## 2016-11-03 DIAGNOSIS — G709 Myoneural disorder, unspecified: Secondary | ICD-10-CM | POA: Insufficient documentation

## 2016-11-03 DIAGNOSIS — Z79899 Other long term (current) drug therapy: Secondary | ICD-10-CM | POA: Insufficient documentation

## 2016-11-03 DIAGNOSIS — I4891 Unspecified atrial fibrillation: Secondary | ICD-10-CM | POA: Insufficient documentation

## 2016-11-03 DIAGNOSIS — Z7984 Long term (current) use of oral hypoglycemic drugs: Secondary | ICD-10-CM | POA: Insufficient documentation

## 2016-11-03 DIAGNOSIS — I10 Essential (primary) hypertension: Secondary | ICD-10-CM | POA: Insufficient documentation

## 2016-11-03 DIAGNOSIS — E1136 Type 2 diabetes mellitus with diabetic cataract: Secondary | ICD-10-CM | POA: Insufficient documentation

## 2016-11-03 HISTORY — PX: CATARACT EXTRACTION W/PHACO: SHX586

## 2016-11-03 LAB — GLUCOSE, CAPILLARY: GLUCOSE-CAPILLARY: 134 mg/dL — AB (ref 65–99)

## 2016-11-03 SURGERY — PHACOEMULSIFICATION, CATARACT, WITH IOL INSERTION
Anesthesia: Monitor Anesthesia Care | Site: Eye | Laterality: Right

## 2016-11-03 MED ORDER — TETRACAINE 0.5 % OP SOLN OPTIME - NO CHARGE
OPHTHALMIC | Status: DC | PRN
  Administered 2016-11-03: 1 [drp] via OPHTHALMIC

## 2016-11-03 MED ORDER — PHENYLEPHRINE HCL 2.5 % OP SOLN
1.0000 [drp] | OPHTHALMIC | Status: AC
  Administered 2016-11-03 (×3): 1 [drp] via OPHTHALMIC

## 2016-11-03 MED ORDER — MIDAZOLAM HCL 2 MG/2ML IJ SOLN
INTRAMUSCULAR | Status: AC
  Filled 2016-11-03: qty 2

## 2016-11-03 MED ORDER — TETRACAINE HCL 0.5 % OP SOLN
1.0000 [drp] | OPHTHALMIC | Status: AC
  Administered 2016-11-03 (×3): 1 [drp] via OPHTHALMIC

## 2016-11-03 MED ORDER — BSS IO SOLN
INTRAOCULAR | Status: DC | PRN
  Administered 2016-11-03: 500 mL

## 2016-11-03 MED ORDER — CYCLOPENTOLATE-PHENYLEPHRINE 0.2-1 % OP SOLN
1.0000 [drp] | OPHTHALMIC | Status: AC
  Administered 2016-11-03 (×3): 1 [drp] via OPHTHALMIC

## 2016-11-03 MED ORDER — KETOROLAC TROMETHAMINE 0.5 % OP SOLN
1.0000 [drp] | OPHTHALMIC | Status: AC
  Administered 2016-11-03 (×3): 1 [drp] via OPHTHALMIC

## 2016-11-03 MED ORDER — FENTANYL CITRATE (PF) 100 MCG/2ML IJ SOLN
INTRAMUSCULAR | Status: AC
  Filled 2016-11-03: qty 2

## 2016-11-03 MED ORDER — MIDAZOLAM HCL 2 MG/2ML IJ SOLN
1.0000 mg | INTRAMUSCULAR | Status: DC | PRN
  Administered 2016-11-03: 2 mg via INTRAVENOUS

## 2016-11-03 MED ORDER — EPINEPHRINE PF 1 MG/ML IJ SOLN
INTRAMUSCULAR | Status: AC
  Filled 2016-11-03: qty 1

## 2016-11-03 MED ORDER — PROVISC 10 MG/ML IO SOLN
INTRAOCULAR | Status: DC | PRN
  Administered 2016-11-03: 0.85 mL via INTRAOCULAR

## 2016-11-03 MED ORDER — FENTANYL CITRATE (PF) 100 MCG/2ML IJ SOLN
25.0000 ug | INTRAMUSCULAR | Status: DC | PRN
  Administered 2016-11-03: 25 ug via INTRAVENOUS

## 2016-11-03 MED ORDER — LACTATED RINGERS IV SOLN
INTRAVENOUS | Status: DC
  Administered 2016-11-03: 1000 mL via INTRAVENOUS

## 2016-11-03 MED ORDER — BSS IO SOLN
INTRAOCULAR | Status: DC | PRN
  Administered 2016-11-03: 15 mL

## 2016-11-03 SURGICAL SUPPLY — 9 items
CLOTH BEACON ORANGE TIMEOUT ST (SAFETY) ×1 IMPLANT
EYE SHIELD UNIVERSAL CLEAR (GAUZE/BANDAGES/DRESSINGS) ×2 IMPLANT
GLOVE BIO SURGEON STRL SZ 6.5 (GLOVE) ×1 IMPLANT
GLOVE EXAM NITRILE MD LF STRL (GLOVE) ×1 IMPLANT
LENS ALC ACRYL/TECN (Ophthalmic Related) ×2 IMPLANT
PAD ARMBOARD 7.5X6 YLW CONV (MISCELLANEOUS) ×1 IMPLANT
TAPE SURG TRANSPORE 1 IN (GAUZE/BANDAGES/DRESSINGS) IMPLANT
TAPE SURGICAL TRANSPORE 1 IN (GAUZE/BANDAGES/DRESSINGS) ×1
WATER STERILE IRR 250ML POUR (IV SOLUTION) ×1 IMPLANT

## 2016-11-03 NOTE — Anesthesia Procedure Notes (Signed)
Procedure Name: MAC Date/Time: 11/03/2016 9:52 AM Performed by: Vista Deck Pre-anesthesia Checklist: Patient identified, Emergency Drugs available, Suction available, Timeout performed and Patient being monitored Patient Re-evaluated:Patient Re-evaluated prior to inductionOxygen Delivery Method: Nasal Cannula

## 2016-11-03 NOTE — Anesthesia Preprocedure Evaluation (Signed)
Anesthesia Evaluation  Patient identified by MRN, date of birth, ID band Patient awake    Reviewed: Allergy & Precautions, H&P , NPO status , Patient's Chart, lab work & pertinent test results, reviewed documented beta blocker date and time   Airway Mallampati: II  TM Distance: >3 FB Neck ROM: Full    Dental no notable dental hx. (+) Teeth Intact, Dental Advisory Given   Pulmonary neg pulmonary ROS,    Pulmonary exam normal breath sounds clear to auscultation       Cardiovascular hypertension, Pt. on medications and Pt. on home beta blockers + CAD  negative cardio ROS   Rhythm:Regular Rate:Normal     Neuro/Psych  Neuromuscular disease negative neurological ROS  negative psych ROS   GI/Hepatic negative GI ROS, Neg liver ROS,   Endo/Other  diabetes, Type 2, Oral Hypoglycemic Agents  Renal/GU negative Renal ROS  negative genitourinary   Musculoskeletal   Abdominal   Peds  Hematology negative hematology ROS (+)   Anesthesia Other Findings   Reproductive/Obstetrics negative OB ROS                             Anesthesia Physical Anesthesia Plan  ASA: III  Anesthesia Plan: MAC   Post-op Pain Management:    Induction: Intravenous  Airway Management Planned: Nasal Cannula  Additional Equipment:   Intra-op Plan:   Post-operative Plan: Extubation in OR  Informed Consent: I have reviewed the patients History and Physical, chart, labs and discussed the procedure including the risks, benefits and alternatives for the proposed anesthesia with the patient or authorized representative who has indicated his/her understanding and acceptance.     Plan Discussed with:   Anesthesia Plan Comments:         Anesthesia Quick Evaluation

## 2016-11-03 NOTE — Op Note (Signed)
Patient brought to the operating room and prepped and draped in the usual manner.  Lid speculum inserted in right eye.  Stab incision made at the twelve o'clock position.  Provisc instilled in the anterior chamber.   A 2.4 mm. Stab incision was made temporally.  An anterior capsulotomy was done with a bent 25 gauge needle.  The nucleus was hydrodissected.  The Phaco tip was inserted in the anterior chamber and the nucleus was emulsified.  CDE was 15.10.  The cortical material was then removed with the I and A tip.  Posterior capsule was the polished.  The anterior chamber was deepened with Provisc.  A 19.5 Diopter Alcon AU00T0 IOL was then inserted in the capsular bag.  Provisc was then removed with the I and A tip.  The wound was then hydrated.  Patient sent to the Recovery Room in good condition with follow up in my office.  Preoperative Diagnosis:  Nuclear Cataract OD Postoperative Diagnosis:  Same Procedure name: Kelman Phacoemulsification OD with IOL

## 2016-11-03 NOTE — Discharge Instructions (Signed)
°          [facility] Instructions [place] [id number] [address]      1. Avoid closing eyes tightly. One often closes the eye tightly when laughing, talking, sneezing, coughing or if they feel irritated. At these times, you should be careful not to close your eyes tightly.  2. Instill eye drops as instructed. To instill drops in your eye, open it, look up and have someone gently pull the lower lid down and instill a couple of drops inside the lower lid.  3. Do not touch upper lid.  4. Take Advil or Tylenol for pain.  5. You may use either eye for near work, such as reading or sewing and you may watch television.  6. You may have your hair done at the beauty parlor at any time.  7. Wear dark glasses with or without your own glasses if you are in bright light.  8. Call our office at [phone] or [phone] if you have sharp pain in your eye or unusual symptoms.  9.  FOLLOW UP WITH DR. SHAPIRO TODAY IN HIS [facility] OFFICE AT 3:30 pm.    I have received a copy of the above instructions and will follow them.     IF YOU ARE IN IMMEDIATE DANGER CALL 911!  It is important for you to keep your follow-up appointment with your physician after discharge, OR, for you /your caregiver to make a follow-up appointment with your physician / medical provider after discharge.  Show these instructions to the next healthcare provider you see.   PATIENT INSTRUCTIONS POST-ANESTHESIA  IMMEDIATELY FOLLOWING SURGERY:  Do not drive or operate machinery for the first twenty four hours after surgery.  Do not make any important decisions for twenty four hours after surgery or while taking narcotic pain medications or sedatives.  If you develop intractable nausea and vomiting or a severe headache please notify your doctor immediately.  FOLLOW-UP:  Please make an appointment with your surgeon as instructed. You do not need to follow up with anesthesia unless  specifically instructed to do so.  WOUND CARE INSTRUCTIONS (if applicable):  Keep a dry clean dressing on the anesthesia/puncture wound site if there is drainage.  Once the wound has quit draining you may leave it open to air.  Generally you should leave the bandage intact for twenty four hours unless there is drainage.  If the epidural site drains for more than 36-48 hours please call the anesthesia department.  QUESTIONS?:  Please feel free to call your physician or the hospital operator if you have any questions, and they will be happy to assist you.

## 2016-11-03 NOTE — Transfer of Care (Signed)
Immediate Anesthesia Transfer of Care Note  Patient: [name]  Procedure(s) Performed: Procedure(s) (LRB): CATARACT EXTRACTION PHACO AND INTRAOCULAR LENS PLACEMENT (IOC) (Right)  Patient Location: Shortstay  Anesthesia Type: MAC  Level of Consciousness: awake  Airway & Oxygen Therapy: Patient Spontanous Breathing   Post-op Assessment: Report given to PACU RN, Post -op Vital signs reviewed and stable and Patient moving all extremities  Post vital signs: Reviewed and stable  Complications: No apparent anesthesia complications

## 2016-11-03 NOTE — H&P (Signed)
The patient was re examined and there is no change in the patients condition since the original H and P. 

## 2016-11-03 NOTE — Anesthesia Postprocedure Evaluation (Signed)
  Anesthesia Post-op Note  Patient: [name]  Procedure(s) Performed: Procedure(s) (LRB): CATARACT EXTRACTION PHACO AND INTRAOCULAR LENS PLACEMENT (IOC) (Right)  Patient Location:  Short Stay  Anesthesia Type: MAC  Level of Consciousness: awake  Airway and Oxygen Therapy: Patient Spontanous Breathing  Post-op Pain: none  Post-op Assessment: Post-op Vital signs reviewed, Patient's Cardiovascular Status Stable, Respiratory Function Stable, Patent Airway, No signs of Nausea or vomiting and Pain level controlled  Post-op Vital Signs: Reviewed and stable  Complications: No apparent anesthesia complications Anesthesia Post Note  Patient: [name]  Procedure(s) Performed: Procedure(s) (LRB): CATARACT EXTRACTION PHACO AND INTRAOCULAR LENS PLACEMENT (IOC) (Right)  Anesthesia Post Evaluation  Last Vitals:  Vitals:   11/03/16 0945 11/03/16 0950  BP: 127/74   Pulse:    Resp: 14 14  Temp:      Last Pain:  Vitals:   11/03/16 0904  TempSrc: Oral  PainSc: 0-No pain                 Nature Vogelsang

## 2016-11-04 ENCOUNTER — Encounter (HOSPITAL_COMMUNITY): Payer: Self-pay | Admitting: Ophthalmology

## 2016-12-18 ENCOUNTER — Other Ambulatory Visit (HOSPITAL_COMMUNITY): Payer: Self-pay | Admitting: Internal Medicine

## 2016-12-18 DIAGNOSIS — I350 Nonrheumatic aortic (valve) stenosis: Secondary | ICD-10-CM

## 2016-12-23 ENCOUNTER — Ambulatory Visit (HOSPITAL_COMMUNITY)
Admission: RE | Admit: 2016-12-23 | Discharge: 2016-12-23 | Disposition: A | Discharge status: Home / Self Care | Payer: Medicare Other | Source: Ambulatory Visit | Attending: Internal Medicine | Admitting: Internal Medicine

## 2016-12-23 DIAGNOSIS — I350 Nonrheumatic aortic (valve) stenosis: Secondary | ICD-10-CM

## 2016-12-23 DIAGNOSIS — Z8774 Personal history of (corrected) congenital malformations of heart and circulatory system: Secondary | ICD-10-CM | POA: Insufficient documentation

## 2016-12-23 NOTE — Progress Notes (Signed)
*  PRELIMINARY RESULTS* Echocardiogram 2D Echocardiogram has been performed.  Leavy Cella 12/23/2016, 1:16 PM

## 2017-01-08 ENCOUNTER — Ambulatory Visit: Payer: Medicare Other | Admitting: Cardiovascular Disease

## 2017-01-20 ENCOUNTER — Encounter: Payer: Self-pay | Admitting: Internal Medicine

## 2017-02-01 ENCOUNTER — Encounter: Payer: Self-pay | Admitting: Cardiology

## 2017-02-01 ENCOUNTER — Ambulatory Visit (INDEPENDENT_AMBULATORY_CARE_PROVIDER_SITE_OTHER): Payer: Medicare Other | Admitting: Cardiology

## 2017-02-01 VITALS — BP 166/70 | HR 86 | Ht 69.0 in | Wt 200.0 lb

## 2017-02-01 DIAGNOSIS — Z9889 Other specified postprocedural states: Secondary | ICD-10-CM

## 2017-02-01 DIAGNOSIS — I350 Nonrheumatic aortic (valve) stenosis: Secondary | ICD-10-CM

## 2017-02-01 DIAGNOSIS — E782 Mixed hyperlipidemia: Secondary | ICD-10-CM | POA: Diagnosis not present

## 2017-02-01 DIAGNOSIS — Z8774 Personal history of (corrected) congenital malformations of heart and circulatory system: Secondary | ICD-10-CM

## 2017-02-01 DIAGNOSIS — I10 Essential (primary) hypertension: Secondary | ICD-10-CM

## 2017-02-01 NOTE — Patient Instructions (Signed)
Your physician wants you to follow-up in: 1 year with Dr Ferne Reus will receive a reminder letter in the mail two months in advance. If you don't receive a letter, please call our office to schedule the follow-up appointment.    Your physician has requested that you have an echocardiogram JUST BEFORE VISIT IN ONE YEAR. Echocardiography is a painless test that uses sound waves to create images of your heart. It provides your doctor with information about the size and shape of your heart and how well your heart's chambers and valves are working. This procedure takes approximately one hour. There are no restrictions for this procedure.    Your physician recommends that you continue on your current medications as directed. Please refer to the Current Medication list given to you today.     If you need a refill on your cardiac medications before your next appointment, please call your pharmacy.      Thank you for choosing [facility] !

## 2017-02-01 NOTE — Progress Notes (Signed)
Cardiology Office Note  Date: 02/01/2017   ID: [name], DOB [birth year], MRN [id number]  PCP: Asencion Noble, MD  Consulting Cardiologist: Rozann Lesches, MD   Chief Complaint  Patient presents with  . Aortic Stenosis    History of Present Illness: [name] is an 83 y.o. male referred for cardiology consultation by Dr. Willey Blade to arrange follow-up of aortic stenosis. He tells that overall he has not noticed any major decline in stamina over the last year or so. He has chronic leg weakness which he attributes to prior history of Guillain-Barr syndrome. He does not report any angina symptoms and has NYHA class II dyspnea with typical activities. 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  He has a known history of aortic stenosis that has been followed over the years. Recent follow-up echocardiogram obtained in January as outlined below. Described as having moderately severe aortic stenosis at that time with mean gradient 36 mmHg. LVEF 60-65%. I discussed the results with him today. We went over the progressive nature of aortic stenosis, possible avenues for treatment including TAVR. He would probably not be an optimal candidate for open valve replacement.  At this point he is comfortable with observation and less symptoms become an issue. I have recommended that he get an echocardiogram within the next 6-12 months.  We went over his current medications which are outlined below. He follows regularly with Dr. Willey Blade.  Past Medical History:  Diagnosis Date  . Aortic stenosis   . Essential hypertension   . Glaucoma   . Guillain-Barre syndrome ([place]) 06/2013   Treated at [facility]  . History of nephrolithiasis   . History of pneumonia   . Hyperlipidemia   . Patent ductus arteriosus    Surgically repaired at age 51  . Prostate cancer ([place])   . Type 2 diabetes mellitus ([place])     Past Surgical  History:  Procedure Laterality Date  . CARPAL TUNNEL RELEASE  2012   left  . CARPAL TUNNEL RELEASE Right 06/12/2014   Procedure: RIGHT CARPAL TUNNEL RELEASE ;  Surgeon: Wynonia Sours, MD;  Location: [facility];  Service: Orthopedics;  Laterality: Right;  . CATARACT EXTRACTION W/PHACO Left 10/13/2016   Procedure: CATARACT EXTRACTION PHACO AND INTRAOCULAR LENS PLACEMENT LEFT EYE CDE=15.04;  Surgeon: Rutherford Guys, MD;  Location: [facility];  Service: Ophthalmology;  Laterality: Left;  left  . CATARACT EXTRACTION W/PHACO Right 11/03/2016   Procedure: CATARACT EXTRACTION PHACO AND INTRAOCULAR LENS PLACEMENT (IOC);  Surgeon: Rutherford Guys, MD;  Location: [facility];  Service: Ophthalmology;  Laterality: Right;  CDE: 15.10  . COLONOSCOPY    . KNEE ARTHROSCOPY  1962   left  . Patent ductus arteriosus repair     57 - age 45  . ULNAR NERVE TRANSPOSITION Right 06/12/2014   Procedure: DECOMPRESSION ULNAR NERVE RIGHT ELBOW;  Surgeon: Wynonia Sours, MD;  Location: [facility];  Service: Orthopedics;  Laterality: Right;  Marland Kitchen VIDEO ASSISTED THORACOSCOPY (VATS)/THOROCOTOMY  8/14   left-guillian-barre    Current Outpatient Prescriptions  Medication Sig Dispense Refill  . aspirin 81 MG tablet Take 81 mg by mouth every evening.     . Chlorphen-PE-Acetaminophen (CORICIDIN D COLD/FLU/SINUS PO) Take 1 tablet by mouth.    Marland Kitchen ibuprofen (ADVIL,MOTRIN) 200 MG tablet Take 400 mg by mouth every 6 (six) hours as needed for moderate pain.    Marland Kitchen latanoprost (XALATAN)  0.005 % ophthalmic solution Place 1 drop into both eyes at bedtime.    . metFORMIN (GLUMETZA) 500 MG (MOD) 24 hr tablet Take 500 mg by mouth 2 (two) times daily with a meal.    . pyridOXINE (VITAMIN B-6) 100 MG tablet Take 100 mg by mouth daily.    . ramipril (ALTACE) 10 MG tablet Take 10 mg by mouth daily.    . simvastatin (ZOCOR) 20 MG tablet Take 20 mg by mouth at bedtime.    . vitamin B-12 (CYANOCOBALAMIN) 1000 MCG tablet Take 1,000 mcg  by mouth daily.     No current facility-administered medications for this visit.    Allergies:  Ativan [lorazepam]   Social History: The patient  reports that he has never smoked. He has never used smokeless tobacco. He reports that he drinks alcohol. He reports that he does not use drugs.   Family History: The patient's family history includes Breast cancer in his mother; Heart attack in his father; Liver cancer in his brother.   ROS:  Please see the history of present illness. Otherwise, complete review of systems is positive for arthritic pain and stiffness.  All other systems are reviewed and negative.   Physical Exam: VS:  BP (!) 166/70 (BP Location: Right Arm)   Pulse 86   Ht 5\' 9"  (1.753 m)   Wt 200 lb (90.7 kg)   SpO2 98%   BMI 29.53 kg/m , BMI Body mass index is 29.53 kg/m.  Wt Readings from Last 3 Encounters:  02/01/17 200 lb (90.7 kg)  11/03/16 203 lb (92.1 kg)  10/13/16 203 lb (92.1 kg)    General: Elderly male, appears comfortable at rest. HEENT: Conjunctiva and lids normal, oropharynx clear. Neck: Supple, no elevated JVP or carotid bruits, no thyromegaly. Lungs: Clear to auscultation, nonlabored breathing at rest. Cardiac: Regular rate and rhythm, no S3, 3/6 systolic murmur radiates towards apex, no pericardial rub. Abdomen: Soft, nontender, bowel sounds present, no guarding or rebound. Extremities: No pitting edema, distal pulses 2+. Skin: Warm and dry. Musculoskeletal: Mild kyphosis. Neuropsychiatric: Alert and oriented x3, affect grossly appropriate.  ECG: I personally reviewed the tracing from 10/08/2016 which shows sinus rhythm with prolonged PR interval, repolarization abnormalities in the inferolateral leads.  Recent Labwork: 10/08/2016: BUN 17; Creatinine, Ser 1.13; Hemoglobin 12.1; Platelets 253; Potassium 4.2; Sodium 137   Other Studies Reviewed Today:  Echocardiogram 12/23/2016: Study Conclusions  - Left ventricle: The cavity size was normal.  Wall thickness was   increased in a pattern of moderate LVH. Systolic function was   normal. The estimated ejection fraction was in the range of 60%   to 65%. Doppler parameters are consistent with abnormal left   ventricular relaxation (grade 1 diastolic dysfunction). - Aortic valve: AV is thickened, calcified with restricted motion   Peak and mean gradients through the valve are 57 and 36 mm Hg   respectively consistent with moderately severe AS. Valve area   (VTI): 0.65 cm^2. Valve area (Vmax): 0.69 cm^2. Valve area   (Vmean): 0.69 cm^2. - Mitral valve: Calcified annulus. Mildly thickened leaflets .  Assessment and Plan:  1. Moderately severe calcific aortic stenosis by recent follow-up echocardiogram. We discussed the natural history of aortic stenosis and a plan for closer follow-up. He states that he would like to continue to see Dr. Willey Blade on a regular basis over the next year, we will get an echocardiogram in no longer than one year's time and see him back. We can certainly see  him sooner if symptoms intervene.  2. Essential hypertension, currently on Altace. He follows regularly with Dr. Willey Blade.  3. Hyperlipidemia, on Zocor.  4. History of surgical PDA repair in childhood.  Current medicines were reviewed with the patient today.   Orders Placed This Encounter  Procedures  . ECHOCARDIOGRAM COMPLETE    Disposition: Follow-up in one year, sooner if needed.  Signed, Satira Sark, MD, [facility] 02/01/2017 8:40 AM    [facility] at [facility] [address], [place], [place] [address] Phone: [phone]; Fax: [phone]

## 2017-04-06 DIAGNOSIS — H401132 Primary open-angle glaucoma, bilateral, moderate stage: Secondary | ICD-10-CM | POA: Insufficient documentation

## 2017-04-06 DIAGNOSIS — Z961 Presence of intraocular lens: Secondary | ICD-10-CM | POA: Insufficient documentation

## 2017-04-06 DIAGNOSIS — E119 Type 2 diabetes mellitus without complications: Secondary | ICD-10-CM | POA: Insufficient documentation

## 2018-03-04 ENCOUNTER — Ambulatory Visit: Payer: Medicare Other | Admitting: Cardiology

## 2018-03-04 ENCOUNTER — Ambulatory Visit (HOSPITAL_COMMUNITY)
Admission: RE | Admit: 2018-03-04 | Discharge: 2018-03-04 | Disposition: A | Discharge status: Home / Self Care | Payer: Medicare Other | Source: Ambulatory Visit | Attending: Cardiology | Admitting: Cardiology

## 2018-03-04 ENCOUNTER — Encounter: Payer: Self-pay | Admitting: Cardiology

## 2018-03-04 VITALS — BP 140/72 | HR 83 | Ht 68.0 in | Wt 205.0 lb

## 2018-03-04 DIAGNOSIS — Z8774 Personal history of (corrected) congenital malformations of heart and circulatory system: Secondary | ICD-10-CM | POA: Diagnosis not present

## 2018-03-04 DIAGNOSIS — I350 Nonrheumatic aortic (valve) stenosis: Secondary | ICD-10-CM

## 2018-03-04 DIAGNOSIS — I083 Combined rheumatic disorders of mitral, aortic and tricuspid valves: Secondary | ICD-10-CM | POA: Diagnosis not present

## 2018-03-04 DIAGNOSIS — Q250 Patent ductus arteriosus: Secondary | ICD-10-CM | POA: Diagnosis not present

## 2018-03-04 DIAGNOSIS — I10 Essential (primary) hypertension: Secondary | ICD-10-CM | POA: Diagnosis not present

## 2018-03-04 DIAGNOSIS — E119 Type 2 diabetes mellitus without complications: Secondary | ICD-10-CM | POA: Diagnosis not present

## 2018-03-04 DIAGNOSIS — R531 Weakness: Secondary | ICD-10-CM | POA: Insufficient documentation

## 2018-03-04 DIAGNOSIS — E782 Mixed hyperlipidemia: Secondary | ICD-10-CM | POA: Diagnosis not present

## 2018-03-04 NOTE — Patient Instructions (Addendum)
Your physician wants you to follow-up in: to be determined after echo in 6 months     Your physician has requested that you have an echocardiogram( IN 6 MONTHS ). Echocardiography is a painless test that uses sound waves to create images of your heart. It provides your doctor with information about the size and shape of your heart and how well your heart's chambers and valves are working. This procedure takes approximately one hour. There are no restrictions for this procedure.    Your physician recommends that you continue on your current medications as directed. Please refer to the Current Medication list given to you today.    No lab work today      Thank you for [facility] !

## 2018-03-04 NOTE — Progress Notes (Addendum)
Cardiology Office Note  Date: 03/04/2018   ID: [name], DOB [birth year], MRN [id number]  PCP: Asencion Noble, MD  Primary Cardiologist: Rozann Lesches, MD   Chief Complaint  Patient presents with  . Aortic Stenosis    History of Present Illness: [name] is an 84 y.o. male that I met in February 2018.  He presents today for a routine follow-up visit.  He tells me that he had gotten away from regular exercise in the first part of last year, but decided to start back when he was feeling so poorly during the summer months.  He had lost a lot of stamina at that time.  He is now going to the [employer] 6 days a week, walks on the treadmill slowly and does other leg exercises.  He is starting to feel better.  He describes what sounds like NYHA class II-III dyspnea depending on level of activity.  No palpitations or syncope.  I personally reviewed his echocardiogram today which is outlined below.  He has severe calcific aortic stenosis, gradients are fairly similar although peak is now up to 66 mmHg.  LVEF remains normal, 60-65% with mild diastolic dysfunction.  I discussed these results with him today.  I have still offered him TAVR evaluation, at least to understand treatment options while he is relatively stable.  For now he wants to hold off but is in agreement to reassessment in 6 months instead of a year.  I personally reviewed his ECG today which shows sinus rhythm with prolonged PR interval, IVCD, and diffuse repolarization abnormalities.  Past Medical History:  Diagnosis Date  . Aortic stenosis   . Essential hypertension   . Glaucoma   . Guillain-Barre syndrome ([place]) 06/2013   Treated at [facility]  . History of nephrolithiasis   . History of pneumonia   . Hyperlipidemia   . Patent ductus arteriosus    Surgically repaired at age 25  . Prostate cancer ([place])   . Type 2 diabetes mellitus ([place])     Past Surgical History:  Procedure Laterality Date  . CARPAL TUNNEL  RELEASE  2012   left  . CARPAL TUNNEL RELEASE Right 06/12/2014   Procedure: RIGHT CARPAL TUNNEL RELEASE ;  Surgeon: Wynonia Sours, MD;  Location: [facility];  Service: Orthopedics;  Laterality: Right;  . CATARACT EXTRACTION W/PHACO Left 10/13/2016   Procedure: CATARACT EXTRACTION PHACO AND INTRAOCULAR LENS PLACEMENT LEFT EYE CDE=15.04;  Surgeon: Rutherford Guys, MD;  Location: [facility];  Service: Ophthalmology;  Laterality: Left;  left  . CATARACT EXTRACTION W/PHACO Right 11/03/2016   Procedure: CATARACT EXTRACTION PHACO AND INTRAOCULAR LENS PLACEMENT (IOC);  Surgeon: Rutherford Guys, MD;  Location: [facility];  Service: Ophthalmology;  Laterality: Right;  CDE: 15.10  . COLONOSCOPY    . KNEE ARTHROSCOPY  1962   left  . Patent ductus arteriosus repair     56 - age 77  . ULNAR NERVE TRANSPOSITION Right 06/12/2014   Procedure: DECOMPRESSION ULNAR NERVE RIGHT ELBOW;  Surgeon: Wynonia Sours, MD;  Location: [facility] [facility];  Service: Orthopedics;  Laterality: Right;  Marland Kitchen VIDEO ASSISTED THORACOSCOPY (VATS)/THOROCOTOMY  8/14   left-guillian-barre    Current Outpatient Medications  Medication Sig Dispense Refill  . aspirin 81 MG tablet Take 81 mg by mouth every evening.     Marland Kitchen ibuprofen (ADVIL,MOTRIN) 200 MG tablet Take 400 mg by mouth every 6 (six) hours as needed for moderate pain.    Marland Kitchen latanoprost (XALATAN)  0.005 % ophthalmic solution Place 1 drop into both eyes at bedtime.    . metFORMIN (GLUMETZA) 500 MG (MOD) 24 hr tablet Take 500 mg by mouth 2 (two) times daily with a meal.    . pyridOXINE (VITAMIN B-6) 100 MG tablet Take 100 mg by mouth daily.    . ramipril (ALTACE) 10 MG tablet Take 10 mg by mouth daily.    . simvastatin (ZOCOR) 20 MG tablet Take 20 mg by mouth at bedtime.    . vitamin B-12 (CYANOCOBALAMIN) 1000 MCG tablet Take 1,000 mcg by mouth daily.     No current facility-administered medications for this visit.    Allergies:  Ativan [lorazepam]   Social History: The  patient  reports that  has never smoked. he has never used smokeless tobacco. He reports that he drinks alcohol. He reports that he does not use drugs.   ROS:  Please see the history of present illness. Otherwise, complete review of systems is positive for chronic leg weakness and stiffness.  All other systems are reviewed and negative.   Physical Exam: VS:  BP 140/72 (BP Location: Right Arm)   Pulse 83   Ht 5' 8"  (1.727 m)   Wt 205 lb (93 kg)   SpO2 97%   BMI 31.17 kg/m , BMI Body mass index is 31.17 kg/m.  Wt Readings from Last 3 Encounters:  03/04/18 205 lb (93 kg)  02/01/17 200 lb (90.7 kg)  11/03/16 203 lb (92.1 kg)    General: Elderly male, appears comfortable at rest. HEENT: Conjunctiva and lids normal, oropharynx clear. Neck: Supple, no elevated JVP or carotid bruits, no thyromegaly. Lungs: Clear to auscultation, nonlabored breathing at rest. Cardiac: Regular rate and rhythm, no S3, 3/6 systolic murmur. Abdomen: Soft, nontender, bowel sounds present, no guarding or rebound. Extremities: No pitting edema, distal pulses 2+. Skin: Warm and dry. Musculoskeletal: No kyphosis. Neuropsychiatric: Alert and oriented x3, affect grossly appropriate.  ECG: I personally reviewed the tracing from 10/08/2016 which shows sinus rhythm with prolonged PR interval, repolarization abnormalities in the inferolateral leads.  Recent Labwork:  10/08/2016: BUN 17; Creatinine, Ser 1.13; Hemoglobin 12.1; Platelets 253; Potassium 4.2; Sodium 137   Other Studies Reviewed Today:  Echocardiogram 12/23/2016: Study Conclusions  - Left ventricle: The cavity size was normal. Wall thickness was   increased in a pattern of moderate LVH. Systolic function was   normal. The estimated ejection fraction was in the range of 60%   to 65%. Doppler parameters are consistent with abnormal left   ventricular relaxation (grade 1 diastolic dysfunction). - Aortic valve: AV is thickened, calcified with restricted  motion   Peak and mean gradients through the valve are 57 and 36 mm Hg   respectively consistent with moderately severe AS. Valve area   (VTI): 0.65 cm^2. Valve area (Vmax): 0.69 cm^2. Valve area   (Vmean): 0.69 cm^2. - Mitral valve: Calcified annulus. Mildly thickened leaflets .  Assessment and Plan:  1.  Severe calcific aortic stenosis by today's echocardiogram.  He does not report major functional decline over the last year now that he has been back to regular exercise, but suspect that this is still limiting him.  I talked with him about referral for TAVR consultation.  For now he prefers to hold off, but is in agreement to reassessment in the next 6 months.  I have asked him to contact our office if he experiences worsening symptoms in the interim.  2.  Essential hypertension, no changes made to  Altace.  3.  Hyperlipidemia on Zocor.  Keep follow-up with Dr. Willey Blade.  4.  History of surgical PDA repair in childhood.  Current medicines were reviewed with the patient today.   Orders Placed This Encounter  Procedures  . EKG 12-Lead  . ECHOCARDIOGRAM COMPLETE    Disposition: Follow-up echocardiogram in 6 months.  Signed, Satira Sark, MD, [facility] 03/04/2018 2:32 PM    [facility] [facility] [address] [place], [place] [address] Phone: [phone]; Fax: [phone]

## 2018-03-04 NOTE — Progress Notes (Signed)
*  PRELIMINARY RESULTS* Echocardiogram 2D Echocardiogram has been performed.  Leavy Cella 03/04/2018, 1:52 PM

## 2018-09-05 ENCOUNTER — Other Ambulatory Visit (HOSPITAL_COMMUNITY): Payer: Medicare Other

## 2018-10-31 DIAGNOSIS — I5032 Chronic diastolic (congestive) heart failure: Secondary | ICD-10-CM | POA: Insufficient documentation

## 2018-11-01 DIAGNOSIS — I739 Peripheral vascular disease, unspecified: Secondary | ICD-10-CM | POA: Insufficient documentation

## 2018-11-02 DIAGNOSIS — Z952 Presence of prosthetic heart valve: Secondary | ICD-10-CM | POA: Insufficient documentation

## 2019-01-02 DIAGNOSIS — Z0000 Encounter for general adult medical examination without abnormal findings: Secondary | ICD-10-CM | POA: Diagnosis not present

## 2019-01-02 DIAGNOSIS — E119 Type 2 diabetes mellitus without complications: Secondary | ICD-10-CM | POA: Diagnosis not present

## 2019-01-05 DIAGNOSIS — M25461 Effusion, right knee: Secondary | ICD-10-CM | POA: Diagnosis not present

## 2019-01-13 ENCOUNTER — Encounter (HOSPITAL_COMMUNITY): Payer: Self-pay

## 2019-01-13 ENCOUNTER — Encounter (HOSPITAL_COMMUNITY)
Admission: RE | Admit: 2019-01-13 | Discharge: 2019-01-13 | Disposition: A | Discharge status: Home / Self Care | Payer: Medicare Other | Source: Ambulatory Visit | Attending: Cardiothoracic Surgery | Admitting: Cardiothoracic Surgery

## 2019-01-13 VITALS — BP 150/66 | HR 80 | Ht 69.0 in | Wt 195.6 lb

## 2019-01-13 DIAGNOSIS — Z952 Presence of prosthetic heart valve: Secondary | ICD-10-CM | POA: Diagnosis not present

## 2019-01-13 NOTE — Progress Notes (Signed)
Daily Session Note  Patient Details  Name: [name] MRN: [id number] Date of Birth: 1929/01/03 Referring Provider:    Encounter Date: 01/13/2019  Check In: Session Check In - 01/13/19 0800      Check-In   Supervising physician immediately available to respond to emergencies  See telemetry face sheet for immediately available ER MD    Location  [facility]    Staff Present  Russella Dar, MS, EP, [facility], Exercise Physiologist;Nastacia Raybuck Wynetta Emery, RN, Cory Munch, Exercise Physiologist    Medication changes reported      No    Fall or balance concerns reported     Yes    Comments  Patient has a h/o falls. 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    Tobacco Cessation  --   Patient has never smoked.    Warm-up and Cool-down  Performed as group-led Higher education careers adviser Performed  Yes    VAD Patient?  No    PAD/SET Patient?  No      Pain Assessment   Currently in Pain?  No/denies    Multiple Pain Sites  No       Capillary Blood Glucose: No results found for this or any previous visit (from the past 24 hour(s)).    Social History   Tobacco Use  Smoking Status Never Smoker  Smokeless Tobacco Never Used    Goals Met:  Exercise tolerated well Personal goals reviewed No report of cardiac concerns or symptoms Strength training completed today Goals Unmet:  Not Applicable  Comments: This was patient's orientation visit. He checked out at 1015.   Dr. Kate Sable is Medical Director for [facility].

## 2019-01-13 NOTE — Progress Notes (Signed)
Cardiac Individual Treatment Plan  Patient Details  Name: [name] MRN: [id number] Date of Birth: [birth year] Referring Provider:     CARDIAC REHAB [place] from 01/13/2019 in [place]  Referring Provider  [place]      Initial Encounter Date:    CARDIAC REHAB PHASE II ORIENTATION from 01/13/2019 in [place]  Date  01/13/19      Visit Diagnosis: S/P TAVR (transcatheter aortic valve replacement)  Patient's Home Medications on Admission:  Current Outpatient Medications:  .  aspirin 81 MG tablet, Take 81 mg by mouth every evening. , Disp: , Rfl:  .  clopidogrel (PLAVIX) 75 MG tablet, Take 75 mg by mouth daily., Disp: , Rfl:  .  latanoprost (XALATAN) 0.005 % ophthalmic solution, Place 1 drop into both eyes at bedtime., Disp: , Rfl:  .  metFORMIN (GLUCOPHAGE) 500 MG tablet, Take 500 mg by mouth 2 (two) times daily with a meal., Disp: , Rfl:  .  pyridOXINE (VITAMIN B-6) 100 MG tablet, Take 100 mg by mouth daily., Disp: , Rfl:  .  simvastatin (ZOCOR) 20 MG tablet, Take 20 mg by mouth at bedtime., Disp: , Rfl:  .  vitamin B-12 (CYANOCOBALAMIN) 1000 MCG tablet, Take 1,000 mcg by mouth daily., Disp: , Rfl:  .  ibuprofen (ADVIL,MOTRIN) 200 MG tablet, Take 400 mg by mouth every 6 (six) hours as needed for moderate pain., Disp: , Rfl:  .  ramipril (ALTACE) 10 MG tablet, Take 10 mg by mouth daily., Disp: , Rfl:   Past Medical History: Past Medical History:  Diagnosis Date  . Aortic stenosis   . Essential hypertension   . Glaucoma   . Guillain-Barre syndrome ([place]) 06/2013   Treated at [facility]  . History of nephrolithiasis   . History of pneumonia   . Hyperlipidemia   . Patent ductus arteriosus    Surgically repaired at age 85  . Prostate cancer ([place])   . Type 2 diabetes mellitus (H[facility])     Tobacco Use: Social History   Tobacco Use  Smoking Status Never Smoker  Smokeless Tobacco Never Used    Labs: Recent Review  Flowsheet Data    There is no flowsheet data to display.      Capillary Blood Glucose: Lab Results  Component Value Date   GLUCAP 134 (H) 11/03/2016   GLUCAP 110 (H) 10/13/2016   GLUCAP 134 (H) 06/12/2014   GLUCAP 135 (H) 09/11/2013   GLUCAP 124 (H) 09/08/2013     Exercise Target Goals: Exercise Program Goal: Individual exercise prescription set using results from initial 6 min walk test and THRR while considering  patient's activity barriers and safety.   Exercise Prescription Goal: Starting with aerobic activity 30 plus minutes a day, 3 days per week for initial exercise prescription. Provide home exercise prescription and guidelines that participant acknowledges understanding prior to discharge.  Activity Barriers & Risk Stratification: Activity Barriers & Cardiac Risk Stratification - 01/13/19 1035      Activity Barriers & Cardiac Risk Stratification   Activity Barriers  Balance Concerns;Muscular Weakness;Joint Problems;Other (comment)    Comments  knee pain     Cardiac Risk Stratification  High       6 Minute Walk: 6 Minute Walk    Row Name 01/13/19 1034         6 Minute Walk   Phase  Initial     Distance  770 feet     Walk Time  6 minutes     #  of Rest Breaks  0     MPH  1.46     METS  2.11     RPE  9     Perceived Dyspnea   8     VO2 Peak  3.78     Symptoms  No     Resting HR  74 bpm     Resting BP  150/66     Resting Oxygen Saturation   94 %     Exercise Oxygen Saturation  during 6 min walk  96 %     Max Ex. HR  97 bpm     Max Ex. BP  146/62     2 Minute Post BP  122/64        Oxygen Initial Assessment:   Oxygen Re-Evaluation:   Oxygen Discharge (Final Oxygen Re-Evaluation):   Initial Exercise Prescription: Initial Exercise Prescription - 01/13/19 1000      Date of Initial Exercise RX and Referring Provider   Date  01/13/19    Referring Provider  [name]    Expected Discharge Date  04/14/19      NuStep   Level  1    SPM  58     Minutes  17    METs  1.8      Arm Ergometer   Level  1    Watts  11    RPM  51    Minutes  17    METs  1.8      Prescription Details   Frequency (times per week)  3    Duration  Progress to 30 minutes of continuous aerobic without signs/symptoms of physical distress      Intensity   THRR 40-80% of Max Heartrate  [phone]    Ratings of Perceived Exertion  11-13    Perceived Dyspnea  0-4      Progression   Progression  Continue to progress workloads to maintain intensity without signs/symptoms of physical distress.      Resistance Training   Training Prescription  Yes    Weight  1    Reps  10-15       Perform Capillary Blood Glucose checks as needed.  Exercise Prescription Changes:   Exercise Comments:   Exercise Goals and Review:  Exercise Goals    Row Name 01/13/19 1038             Exercise Goals   Increase Physical Activity  Yes       Intervention  Provide advice, education, support and counseling about physical activity/exercise needs.;Develop an individualized exercise prescription for aerobic and resistive training based on initial evaluation findings, risk stratification, comorbidities and participant's personal goals.       Expected Outcomes  Short Term: Attend rehab on a regular basis to increase amount of physical activity.       Increase Strength and Stamina  Yes       Intervention  Provide advice, education, support and counseling about physical activity/exercise needs.       Expected Outcomes  Short Term: Increase workloads from initial exercise prescription for resistance, speed, and METs.       Able to understand and use rate of perceived exertion (RPE) scale  Yes       Intervention  Provide education and explanation on how to use RPE scale       Expected Outcomes  Short Term: Able to use RPE daily in rehab to express subjective intensity level;Long Term:  Able to use  RPE to guide intensity level when exercising independently       Able to  understand and use Dyspnea scale  Yes       Intervention  Provide education and explanation on how to use Dyspnea scale       Expected Outcomes  Short Term: Able to use Dyspnea scale daily in rehab to express subjective sense of shortness of breath during exertion;Long Term: Able to use Dyspnea scale to guide intensity level when exercising independently       Knowledge and understanding of Target Heart Rate Range (THRR)  Yes       Intervention  Provide education and explanation of THRR including how the numbers were predicted and where they are located for reference       Expected Outcomes  Short Term: Able to state/look up THRR       Able to check pulse independently  Yes       Intervention  Provide education and demonstration on how to check pulse in carotid and radial arteries.;Review the importance of being able to check your own pulse for safety during independent exercise       Expected Outcomes  Short Term: Able to explain why pulse checking is important during independent exercise;Long Term: Able to check pulse independently and accurately       Understanding of Exercise Prescription  Yes       Intervention  Provide education, explanation, and written materials on patient's individual exercise prescription       Expected Outcomes  Short Term: Able to explain program exercise prescription;Long Term: Able to explain home exercise prescription to exercise independently          Exercise Goals Re-Evaluation :    Discharge Exercise Prescription (Final Exercise Prescription Changes):   Nutrition:  Target Goals: Understanding of nutrition guidelines, daily intake of sodium 1500mg , cholesterol 200mg , calories 30% from fat and 7% or less from saturated fats, daily to have 5 or more servings of fruits and vegetables.  Biometrics: Pre Biometrics - 01/13/19 1038      Pre Biometrics   Height  5\' 9"  (1.753 m)    Weight  88.7 kg    Waist Circumference  43 inches    Hip Circumference  41.5  inches    Waist to Hip Ratio  1.04 %    BMI (Calculated)  28.87    Triceps Skinfold  10 mm    % Body Fat  23.3 %    Grip Strength  8 kg        Nutrition Therapy Plan and Nutrition Goals: Nutrition Therapy & Goals - 01/13/19 1020      Personal Nutrition Goals   Comments  Patient says he is not interested in changing his diet at this point in his life. He does say he wants to attend the RD appointment.        Nutrition Assessments: Nutrition Assessments - 01/13/19 1011      MEDFICTS Scores   Pre Score  72       Nutrition Goals Re-Evaluation:   Nutrition Goals Discharge (Final Nutrition Goals Re-Evaluation):   Psychosocial: Target Goals: Acknowledge presence or absence of significant depression and/or stress, maximize coping skills, provide positive support system. Participant is able to verbalize types and ability to use techniques and skills needed for reducing stress and depression.  Initial Review & Psychosocial Screening: Initial Psych Review & Screening - 01/13/19 1022      Initial Review   Current issues  with  None Identified      Family Dynamics   Good Support System?  Yes      Barriers   Psychosocial barriers to participate in program  There are no identifiable barriers or psychosocial needs.      Screening Interventions   Interventions  Encouraged to exercise       Quality of Life Scores: Quality of Life - 01/13/19 1039      Quality of Life   Select  Quality of Life      Quality of Life Scores   Health/Function Pre  18.47 %    Socioeconomic Pre  24.83 %    Psych/Spiritual Pre  25.29 %    Family Pre  19.5 %    GLOBAL Pre  21.28 %      Scores of 19 and below usually indicate a poorer quality of life in these areas.  A difference of  2-3 points is a clinically meaningful difference.  A difference of 2-3 points in the total score of the Quality of Life Index has been associated with significant improvement in overall quality of life, self-image,  physical symptoms, and general health in studies assessing change in quality of life.  PHQ-9: Recent Review Flowsheet Data    Depression screen [facility] 2/9 01/13/2019   Decreased Interest 0   Down, Depressed, Hopeless 0   PHQ - 2 Score 0   Altered sleeping 0   Tired, decreased energy 2   Change in appetite 0   Feeling bad or failure about yourself  0   Trouble concentrating 0   Moving slowly or fidgety/restless 0   Suicidal thoughts 0   PHQ-9 Score 2   Difficult doing work/chores Not difficult at all     Interpretation of Total Score  Total Score Depression Severity:  1-4 = Minimal depression, 5-9 = Mild depression, 10-14 = Moderate depression, 15-19 = Moderately severe depression, 20-27 = Severe depression   Psychosocial Evaluation and Intervention: Psychosocial Evaluation - 01/13/19 1041      Psychosocial Evaluation & Interventions   Interventions  Encouraged to exercise with the program and follow exercise prescription;Stress management education;Relaxation education    Comments  Patient has no psychosocial issues identified at orientation. His initial QOL score was 21.28 and his PHQ-9 score was 2.     Expected Outcomes  Patient will have no psychosocial issues identified at discharge.     Continue Psychosocial Services   No Follow up required       Psychosocial Re-Evaluation:   Psychosocial Discharge (Final Psychosocial Re-Evaluation):   Vocational Rehabilitation: Provide vocational rehab assistance to qualifying candidates.   Vocational Rehab Evaluation & Intervention: Vocational Rehab - 01/13/19 1018      Initial Vocational Rehab Evaluation & Intervention   Assessment shows need for Vocational Rehabilitation  No      Vocational Rehab Re-Evaulation   Comments  Patient is retired and not interested in returning to employment.        Education: Education Goals: Education classes will be provided on a weekly basis, covering required topics. Participant will state  understanding/return demonstration of topics presented.  Learning Barriers/Preferences: Learning Barriers/Preferences - 01/13/19 1025      Learning Barriers/Preferences   Learning Barriers  None    Learning Preferences  Skilled Demonstration       Education Topics: Hypertension, Hypertension Reduction -Define heart disease and high blood pressure. Discus how high blood pressure affects the body and ways to reduce high blood pressure.  Exercise and Your Heart -Discuss why it is important to exercise, the FITT principles of exercise, normal and abnormal responses to exercise, and how to exercise safely.   Angina -Discuss definition of angina, causes of angina, treatment of angina, and how to decrease risk of having angina.   Cardiac Medications -Review what the following cardiac medications are used for, how they affect the body, and side effects that may occur when taking the medications.  Medications include Aspirin, Beta blockers, calcium channel blockers, ACE Inhibitors, angiotensin receptor blockers, diuretics, digoxin, and antihyperlipidemics.   Congestive Heart Failure -Discuss the definition of CHF, how to live with CHF, the signs and symptoms of CHF, and how keep track of weight and sodium intake.   Heart Disease and Intimacy -Discus the effect sexual activity has on the heart, how changes occur during intimacy as we age, and safety during sexual activity.   Smoking Cessation / COPD -Discuss different methods to quit smoking, the health benefits of quitting smoking, and the definition of COPD.   Nutrition I: Fats -Discuss the types of cholesterol, what cholesterol does to the heart, and how cholesterol levels can be controlled.   Nutrition II: Labels -Discuss the different components of food labels and how to read food label   Heart Parts/Heart Disease and PAD -Discuss the anatomy of the heart, the pathway of blood circulation through the heart, and these are  affected by heart disease.   Stress I: Signs and Symptoms -Discuss the causes of stress, how stress may lead to anxiety and depression, and ways to limit stress.   Stress II: Relaxation -Discuss different types of relaxation techniques to limit stress.   Warning Signs of Stroke / TIA -Discuss definition of a stroke, what the signs and symptoms are of a stroke, and how to identify when someone is having stroke.   Knowledge Questionnaire Score: Knowledge Questionnaire Score - 01/13/19 1011      Knowledge Questionnaire Score   Pre Score  19/24       Core Components/Risk Factors/Patient Goals at Admission: Personal Goals and Risk Factors at Admission - 01/13/19 1020      Core Components/Risk Factors/Patient Goals on Admission    Weight Management  Weight Maintenance    Diabetes  Yes    Intervention  Provide education about signs/symptoms and action to take for hypo/hyperglycemia.;Provide education about proper nutrition, including hydration, and aerobic/resistive exercise prescription along with prescribed medications to achieve blood glucose in normal ranges: Fasting glucose 65-99 mg/dL    Expected Outcomes  Long Term: Attainment of HbA1C < 7%.;Short Term: Participant verbalizes understanding of the signs/symptoms and immediate care of hyper/hypoglycemia, proper foot care and importance of medication, aerobic/resistive exercise and nutrition plan for blood glucose control.    Hypertension  Yes    Intervention  Provide education on lifestyle modifcations including regular physical activity/exercise, weight management, moderate sodium restriction and increased consumption of fresh fruit, vegetables, and low fat dairy, alcohol moderation, and smoking cessation.    Expected Outcomes  Short Term: Continued assessment and intervention until BP is < 140/11mm HG in hypertensive participants. < 130/31mm HG in hypertensive participants with diabetes, heart failure or chronic kidney disease.     Personal Goal Other  Yes    Personal Goal  Improve mobility; stay active.     Intervention  Patient will attend CR 3 days/week and will supplement with exercise 2 days/week.    Expected Outcomes  Patient will meet his personal goals.  Core Components/Risk Factors/Patient Goals Review:    Core Components/Risk Factors/Patient Goals at Discharge (Final Review):    ITP Comments: ITP Comments    Row Name 01/13/19 1035           ITP Comments  Patient is an 85 year old male referred to CR for TVAR. He has a h/o guillain barre causing an unsteady gait and a h/o falls.           Comments: Patient arrived for 1st visit/orientation/education at 0800. Patient was referred to CR by Durward Mallard due to [facility] (Z95.2). During orientation advised patient on arrival and appointment times what to wear, what to do before, during and after exercise. Reviewed attendance and class policy. Talked about inclement weather and class consultation policy. Pt is scheduled to return Cardiac Rehab on 01/16/19 at 0815. Pt was advised to come to class 15 minutes before class starts. Patient was also given instructions on meeting with the dietician and attending the Family Structure classes. Discussed RPE/Dpysnea scales. Discussed initial THR and how to find their radial and/or carotid pulse. Discussed the initial exercise prescription and how this effects their progress. Pt is eager to get started. Patient participated in warm up stretches followed by light weights and resistance bands. Patient was able to complete 6 minute walk test.  Patient was measured for the equipment. Discussed equipment safety with patient. Took patient pre-anthropometric measurements. Patient finished visit at 1015.

## 2019-01-16 ENCOUNTER — Encounter (HOSPITAL_COMMUNITY)
Admission: RE | Admit: 2019-01-16 | Discharge: 2019-01-16 | Disposition: A | Discharge status: Home / Self Care | Payer: Medicare Other | Source: Ambulatory Visit | Attending: Cardiothoracic Surgery | Admitting: Cardiothoracic Surgery

## 2019-01-16 DIAGNOSIS — Z952 Presence of prosthetic heart valve: Secondary | ICD-10-CM

## 2019-01-16 NOTE — Progress Notes (Signed)
Daily Session Note  Patient Details  Name: [name] MRN: [id number] Date of Birth: 1929-12-03 Referring Provider:     CARDIAC REHAB PHASE II ORIENTATION from 01/13/2019 in [place]  Referring Provider  [name]      Encounter Date: 01/16/2019  Check In: Session Check In - 01/16/19 0815      Check-In   Supervising physician immediately available to respond to emergencies  See telemetry face sheet for immediately available ER MD    Location  [facility]    Staff Present  Russella Dar, MS, EP, [facility], Exercise Physiologist;Debra Wynetta Emery, RN, Cory Munch, Exercise Physiologist    Medication changes reported      No    Fall or balance concerns reported     Yes    Comments  Patient has a h/o falls. 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    Warm-up and Cool-down  Performed as group-led Higher education careers adviser Performed  Yes    VAD Patient?  No    PAD/SET Patient?  No      Pain Assessment   Currently in Pain?  No/denies    Pain Score  0-No pain    Multiple Pain Sites  No       Capillary Blood Glucose: No results found for this or any previous visit (from the past 24 hour(s)).    Social History   Tobacco Use  Smoking Status Never Smoker  Smokeless Tobacco Never Used    Goals Met:  Independence with exercise equipment Exercise tolerated well No report of cardiac concerns or symptoms Strength training completed today  Goals Unmet:  Not Applicable  Comments: Pt able to follow exercise prescription today without complaint.  Will continue to monitor for progression. Check out 0915.   Dr. Kate Sable is Medical Director for [facility] Cardiac and Pulmonary Rehab.

## 2019-01-18 ENCOUNTER — Encounter (HOSPITAL_COMMUNITY)
Admission: RE | Admit: 2019-01-18 | Discharge: 2019-01-18 | Disposition: A | Discharge status: Home / Self Care | Payer: Medicare Other | Source: Ambulatory Visit | Attending: Cardiothoracic Surgery | Admitting: Cardiothoracic Surgery

## 2019-01-18 DIAGNOSIS — Z952 Presence of prosthetic heart valve: Secondary | ICD-10-CM

## 2019-01-18 NOTE — Progress Notes (Signed)
Daily Session Note  Patient Details  Name: [name] MRN: [id number] Date of Birth: Feb 08, 1929 Referring Provider:     CARDIAC REHAB PHASE II ORIENTATION from 01/13/2019 in [place]  Referring Provider  [name]      Encounter Date: 01/18/2019  Check In: Session Check In - 01/18/19 0815      Check-In   Supervising physician immediately available to respond to emergencies  See telemetry face sheet for immediately available ER MD    Location  [facility]    Staff Present  Russella Dar, MS, EP, [facility], Exercise Physiologist;Debra Wynetta Emery, RN, Cory Munch, Exercise Physiologist    Medication changes reported      No    Fall or balance concerns reported     Yes    Comments  Patient has a h/o falls. 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    Warm-up and Cool-down  Performed as group-led Higher education careers adviser Performed  Yes    VAD Patient?  No    PAD/SET Patient?  No      Pain Assessment   Currently in Pain?  No/denies    Pain Score  0-No pain    Multiple Pain Sites  No       Capillary Blood Glucose: No results found for this or any previous visit (from the past 24 hour(s)).    Social History   Tobacco Use  Smoking Status Never Smoker  Smokeless Tobacco Never Used    Goals Met:  Proper associated with RPD/PD & O2 Sat Independence with exercise equipment Exercise tolerated well No report of cardiac concerns or symptoms Strength training completed today  Goals Unmet:  Not Applicable  Comments: Pt able to follow exercise prescription today without complaint.  Will continue to monitor for progression. Check out 0915.   Dr. Kate Sable is Medical Director for [facility] Cardiac and Pulmonary Rehab.

## 2019-01-20 ENCOUNTER — Encounter (HOSPITAL_COMMUNITY)
Admission: RE | Admit: 2019-01-20 | Discharge: 2019-01-20 | Disposition: A | Discharge status: Home / Self Care | Payer: Medicare Other | Source: Ambulatory Visit | Attending: Cardiothoracic Surgery | Admitting: Cardiothoracic Surgery

## 2019-01-20 DIAGNOSIS — Z952 Presence of prosthetic heart valve: Secondary | ICD-10-CM

## 2019-01-20 NOTE — Progress Notes (Signed)
Daily Session Note  Patient Details  Name: RUMI TARAS MRN: [id number] Date of Birth: Apr 30, 1929 Referring Provider:     CARDIAC REHAB [place] from 01/13/2019 in [place]  Referring Provider  [name]      Encounter Date: 01/20/2019  Check In: Session Check In - 01/20/19 0815      Check-In   Supervising physician immediately available to respond to emergencies  See telemetry face sheet for immediately available ER MD    Location  [facility]    Staff Present  Russella Dar, MS, EP, [facility], Exercise Physiologist;Alle Difabio Zachery Conch, Exercise Physiologist;Other    Medication changes reported      No    Fall or balance concerns reported     Yes    Comments  Patient has a h/o falls. 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    Warm-up and Cool-down  Performed as group-led Higher education careers adviser Performed  Yes    VAD Patient?  No    PAD/SET Patient?  No      Pain Assessment   Currently in Pain?  No/denies    Pain Score  0-No pain    Multiple Pain Sites  No       Capillary Blood Glucose: No results found for this or any previous visit (from the past 24 hour(s)).    Social History   Tobacco Use  Smoking Status Never Smoker  Smokeless Tobacco Never Used    Goals Met:  Proper associated with RPD/PD & O2 Sat Independence with exercise equipment Exercise tolerated well No report of cardiac concerns or symptoms Strength training completed today  Goals Unmet:  Not Applicable  Comments: Pt able to follow exercise prescription today without complaint.  Will continue to monitor for progression. Check out 0915.   Dr. Kate Sable is Medical Director for [facility] Cardiac and Pulmonary Rehab.

## 2019-01-23 ENCOUNTER — Encounter (HOSPITAL_COMMUNITY)
Admission: RE | Admit: 2019-01-23 | Discharge: 2019-01-23 | Disposition: A | Discharge status: Home / Self Care | Payer: Medicare Other | Source: Ambulatory Visit | Attending: Cardiothoracic Surgery | Admitting: Cardiothoracic Surgery

## 2019-01-23 DIAGNOSIS — Z952 Presence of prosthetic heart valve: Secondary | ICD-10-CM | POA: Insufficient documentation

## 2019-01-23 NOTE — Progress Notes (Signed)
Daily Session Note  Patient Details  Name: [name] MRN: [id number] Date of Birth: May 19, 1929 Referring Provider:     CARDIAC REHAB PHASE II ORIENTATION from 01/13/2019 in [place]  Referring Provider  [name]      Encounter Date: 01/23/2019  Check In: Session Check In - 01/23/19 0815      Check-In   Supervising physician immediately available to respond to emergencies  See telemetry face sheet for immediately available ER MD    Location  [facility]    Staff Present  Benay Pike, Exercise Physiologist;Debra Wynetta Emery, RN, BSN    Medication changes reported      No    Comments  Patient has a h/o falls. 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    Warm-up and Cool-down  Performed as group-led Higher education careers adviser Performed  Yes    VAD Patient?  No    PAD/SET Patient?  No      Pain Assessment   Currently in Pain?  No/denies    Pain Score  0-No pain    Multiple Pain Sites  No       Capillary Blood Glucose: No results found for this or any previous visit (from the past 24 hour(s)).    Social History   Tobacco Use  Smoking Status Never Smoker  Smokeless Tobacco Never Used    Goals Met:  Proper associated with RPD/PD & O2 Sat Independence with exercise equipment Exercise tolerated well No report of cardiac concerns or symptoms Strength training completed today  Goals Unmet:  Not Applicable  Comments: Pt able to follow exercise prescription today without complaint.  Will continue to monitor for progression. Check out 0915.   Dr. Kate Sable is Medical Director for [facility] Cardiac and Pulmonary Rehab.

## 2019-01-25 ENCOUNTER — Encounter (HOSPITAL_COMMUNITY)
Admission: RE | Admit: 2019-01-25 | Discharge: 2019-01-25 | Disposition: A | Discharge status: Home / Self Care | Payer: Medicare Other | Source: Ambulatory Visit | Attending: Cardiothoracic Surgery | Admitting: Cardiothoracic Surgery

## 2019-01-25 DIAGNOSIS — Z952 Presence of prosthetic heart valve: Secondary | ICD-10-CM | POA: Diagnosis not present

## 2019-01-25 NOTE — Progress Notes (Signed)
Daily Session Note  Patient Details  Name: [name] MRN: [id number] Date of Birth: 1929/07/01 Referring Provider:     [facility] from 01/13/2019 in [place]  Referring Provider  [name]      Encounter Date: 01/25/2019  Check In: Session Check In - 01/25/19 0815      Check-In   Supervising physician immediately available to respond to emergencies  See telemetry face sheet for immediately available ER MD    Location  [facility]    Staff Present  Benay Pike, Exercise Physiologist;Debra Wynetta Emery, RN, BSN;Diane Coad, MS, EP, [facility], Exercise Physiologist    Medication changes reported      No    Fall or balance concerns reported     Yes    Comments  Patient has a h/o falls. 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    Warm-up and Cool-down  Performed as group-led Higher education careers adviser Performed  Yes    VAD Patient?  No    PAD/SET Patient?  No      Pain Assessment   Currently in Pain?  No/denies    Pain Score  0-No pain    Multiple Pain Sites  No       Capillary Blood Glucose: No results found for this or any previous visit (from the past 24 hour(s)).    Social History   Tobacco Use  Smoking Status Never Smoker  Smokeless Tobacco Never Used    Goals Met:  Independence with exercise equipment Exercise tolerated well No report of cardiac concerns or symptoms Strength training completed today  Goals Unmet:  Not Applicable  Comments: Pt able to follow exercise prescription today without complaint.  Will continue to monitor for progression. Check out 0915.   Dr. Kate Sable is Medical Director for [facility].

## 2019-01-26 DIAGNOSIS — I10 Essential (primary) hypertension: Secondary | ICD-10-CM | POA: Diagnosis not present

## 2019-01-26 DIAGNOSIS — E1140 Type 2 diabetes mellitus with diabetic neuropathy, unspecified: Secondary | ICD-10-CM | POA: Diagnosis not present

## 2019-01-26 DIAGNOSIS — I350 Nonrheumatic aortic (valve) stenosis: Secondary | ICD-10-CM | POA: Diagnosis not present

## 2019-01-26 NOTE — Progress Notes (Signed)
Cardiac Individual Treatment Plan  Patient Details  Name: [name] MRN: [id number] Date of Birth: [birth year] Referring Provider:     CARDIAC REHAB [place] from 01/13/2019 in [place]  Referring Provider  [place]      Initial Encounter Date:    CARDIAC REHAB PHASE II ORIENTATION from 01/13/2019 in [place]  Date  01/13/19      Visit Diagnosis: S/P TAVR (transcatheter aortic valve replacement)  Patient's Home Medications on Admission:  Current Outpatient Medications:  .  aspirin 81 MG tablet, Take 81 mg by mouth every evening. , Disp: , Rfl:  .  clopidogrel (PLAVIX) 75 MG tablet, Take 75 mg by mouth daily., Disp: , Rfl:  .  ibuprofen (ADVIL,MOTRIN) 200 MG tablet, Take 400 mg by mouth every 6 (six) hours as needed for moderate pain., Disp: , Rfl:  .  latanoprost (XALATAN) 0.005 % ophthalmic solution, Place 1 drop into both eyes at bedtime., Disp: , Rfl:  .  metFORMIN (GLUCOPHAGE) 500 MG tablet, Take 500 mg by mouth 2 (two) times daily with a meal., Disp: , Rfl:  .  pyridOXINE (VITAMIN B-6) 100 MG tablet, Take 100 mg by mouth daily., Disp: , Rfl:  .  ramipril (ALTACE) 10 MG tablet, Take 10 mg by mouth daily., Disp: , Rfl:  .  simvastatin (ZOCOR) 20 MG tablet, Take 20 mg by mouth at bedtime., Disp: , Rfl:  .  vitamin B-12 (CYANOCOBALAMIN) 1000 MCG tablet, Take 1,000 mcg by mouth daily., Disp: , Rfl:   Past Medical History: Past Medical History:  Diagnosis Date  . Aortic stenosis   . Essential hypertension   . Glaucoma   . Guillain-Barre syndrome ([place]) 06/2013   Treated at [facility]  . History of nephrolithiasis   . History of pneumonia   . Hyperlipidemia   . Patent ductus arteriosus    Surgically repaired at age 67  . Prostate cancer ([place])   . Type 2 diabetes mellitus (H[facility])     Tobacco Use: Social History   Tobacco Use  Smoking Status Never Smoker  Smokeless Tobacco Never Used    Labs: Recent Review  Flowsheet Data    There is no flowsheet data to display.      Capillary Blood Glucose: Lab Results  Component Value Date   GLUCAP 134 (H) 11/03/2016   GLUCAP 110 (H) 10/13/2016   GLUCAP 134 (H) 06/12/2014   GLUCAP 135 (H) 09/11/2013   GLUCAP 124 (H) 09/08/2013     Exercise Target Goals: Exercise Program Goal: Individual exercise prescription set using results from initial 6 min walk test and THRR while considering  patient's activity barriers and safety.   Exercise Prescription Goal: Starting with aerobic activity 30 plus minutes a day, 3 days per week for initial exercise prescription. Provide home exercise prescription and guidelines that participant acknowledges understanding prior to discharge.  Activity Barriers & Risk Stratification: Activity Barriers & Cardiac Risk Stratification - 01/13/19 1035      Activity Barriers & Cardiac Risk Stratification   Activity Barriers  Balance Concerns;Muscular Weakness;Joint Problems;Other (comment)    Comments  knee pain     Cardiac Risk Stratification  High       6 Minute Walk: 6 Minute Walk    Row Name 01/13/19 1034         6 Minute Walk   Phase  Initial     Distance  770 feet     Walk Time  6 minutes     #  of Rest Breaks  0     MPH  1.46     METS  2.11     RPE  9     Perceived Dyspnea   8     VO2 Peak  3.78     Symptoms  No     Resting HR  74 bpm     Resting BP  150/66     Resting Oxygen Saturation   94 %     Exercise Oxygen Saturation  during 6 min walk  96 %     Max Ex. HR  97 bpm     Max Ex. BP  146/62     2 Minute Post BP  122/64        Oxygen Initial Assessment:   Oxygen Re-Evaluation:   Oxygen Discharge (Final Oxygen Re-Evaluation):   Initial Exercise Prescription: Initial Exercise Prescription - 01/13/19 1000      Date of Initial Exercise RX and Referring Provider   Date  01/13/19    Referring Provider  [name]    Expected Discharge Date  04/14/19      NuStep   Level  1    SPM  58     Minutes  17    METs  1.8      Arm Ergometer   Level  1    Watts  11    RPM  51    Minutes  17    METs  1.8      Prescription Details   Frequency (times per week)  3    Duration  Progress to 30 minutes of continuous aerobic without signs/symptoms of physical distress      Intensity   THRR 40-80% of Max Heartrate  [phone]    Ratings of Perceived Exertion  11-13    Perceived Dyspnea  0-4      Progression   Progression  Continue to progress workloads to maintain intensity without signs/symptoms of physical distress.      Resistance Training   Training Prescription  Yes    Weight  1    Reps  10-15       Perform Capillary Blood Glucose checks as needed.  Exercise Prescription Changes:  Exercise Prescription Changes    Row Name 01/17/19 1000             Response to Exercise   Blood Pressure (Admit)  132/64       Blood Pressure (Exercise)  180/62       Blood Pressure (Exit)  144/72       Heart Rate (Admit)  83 bpm       Heart Rate (Exercise)  115 bpm       Heart Rate (Exit)  92 bpm       Rating of Perceived Exertion (Exercise)  11       Comments  First day of exercise!        Duration  Continue with 30 min of aerobic exercise without signs/symptoms of physical distress.       Intensity  THRR New [phone]         Progression   Progression  Continue to progress workloads to maintain intensity without signs/symptoms of physical distress.       Average METs  2.05         Resistance Training   Training Prescription  Yes       Weight  1       Reps  10-15  NuStep   Level  1       SPM  81       Minutes  17       METs  2.2         Arm Ergometer   Level  1       Watts  12       RPM  54       Minutes  22       METs  1.9         Home Exercise Plan   Plans to continue exercise at  Home (comment)       Frequency  Add 2 additional days to program exercise sessions.       Initial Home Exercises Provided  01/13/19          Exercise  Comments:  Exercise Comments    Row Name 01/24/19 0801           Exercise Comments  Pt. is new to CR. He has attended 5 sessions so far. He has tolerated the NuStep and Arm Ergometer well. We will continue to monitor and progress as he increases strength.           Exercise Goals and Review:  Exercise Goals    Row Name 01/13/19 1038             Exercise Goals   Increase Physical Activity  Yes       Intervention  Provide advice, education, support and counseling about physical activity/exercise needs.;Develop an individualized exercise prescription for aerobic and resistive training based on initial evaluation findings, risk stratification, comorbidities and participant's personal goals.       Expected Outcomes  Short Term: Attend rehab on a regular basis to increase amount of physical activity.       Increase Strength and Stamina  Yes       Intervention  Provide advice, education, support and counseling about physical activity/exercise needs.       Expected Outcomes  Short Term: Increase workloads from initial exercise prescription for resistance, speed, and METs.       Able to understand and use rate of perceived exertion (RPE) scale  Yes       Intervention  Provide education and explanation on how to use RPE scale       Expected Outcomes  Short Term: Able to use RPE daily in rehab to express subjective intensity level;Long Term:  Able to use RPE to guide intensity level when exercising independently       Able to understand and use Dyspnea scale  Yes       Intervention  Provide education and explanation on how to use Dyspnea scale       Expected Outcomes  Short Term: Able to use Dyspnea scale daily in rehab to express subjective sense of shortness of breath during exertion;Long Term: Able to use Dyspnea scale to guide intensity level when exercising independently       Knowledge and understanding of Target Heart Rate Range (THRR)  Yes       Intervention  Provide education and  explanation of THRR including how the numbers were predicted and where they are located for reference       Expected Outcomes  Short Term: Able to state/look up THRR       Able to check pulse independently  Yes       Intervention  Provide education and demonstration on how to check pulse in carotid  and radial arteries.;Review the importance of being able to check your own pulse for safety during independent exercise       Expected Outcomes  Short Term: Able to explain why pulse checking is important during independent exercise;Long Term: Able to check pulse independently and accurately       Understanding of Exercise Prescription  Yes       Intervention  Provide education, explanation, and written materials on patient's individual exercise prescription       Expected Outcomes  Short Term: Able to explain program exercise prescription;Long Term: Able to explain home exercise prescription to exercise independently          Exercise Goals Re-Evaluation : Exercise Goals Re-Evaluation    Row Name 01/24/19 0759             Exercise Goal Re-Evaluation   Exercise Goals Review  Increase Physical Activity;Increase Strength and Stamina;Able to understand and use rate of perceived exertion (RPE) scale;Knowledge and understanding of Target Heart Rate Range (THRR);Able to check pulse independently;Understanding of Exercise Prescription       Comments  Pt. is still fairly new to the program. He has attended 5 sessions so far including his orientation. He has been able to tolerate the exercise well. We will monitor and progress as needed.       Expected Outcomes  improve mobility to stay active.            Discharge Exercise Prescription (Final Exercise Prescription Changes): Exercise Prescription Changes - 01/17/19 1000      Response to Exercise   Blood Pressure (Admit)  132/64    Blood Pressure (Exercise)  180/62    Blood Pressure (Exit)  144/72    Heart Rate (Admit)  83 bpm    Heart Rate  (Exercise)  115 bpm    Heart Rate (Exit)  92 bpm    Rating of Perceived Exertion (Exercise)  11    Comments  First day of exercise!     Duration  Continue with 30 min of aerobic exercise without signs/symptoms of physical distress.    Intensity  THRR New   [phone]     Progression   Progression  Continue to progress workloads to maintain intensity without signs/symptoms of physical distress.    Average METs  2.05      Resistance Training   Training Prescription  Yes    Weight  1    Reps  10-15      NuStep   Level  1    SPM  81    Minutes  17    METs  2.2      Arm Ergometer   Level  1    Watts  12    RPM  54    Minutes  22    METs  1.9      Home Exercise Plan   Plans to continue exercise at  Home (comment)    Frequency  Add 2 additional days to program exercise sessions.    Initial Home Exercises Provided  01/13/19       Nutrition:  Target Goals: Understanding of nutrition guidelines, daily intake of sodium 1500mg , cholesterol 200mg , calories 30% from fat and 7% or less from saturated fats, daily to have 5 or more servings of fruits and vegetables.  Biometrics: Pre Biometrics - 01/13/19 1038      Pre Biometrics   Height  5\' 9"  (1.753 m)    Weight  88.7 kg  Waist Circumference  43 inches    Hip Circumference  41.5 inches    Waist to Hip Ratio  1.04 %    BMI (Calculated)  28.87    Triceps Skinfold  10 mm    % Body Fat  23.3 %    Grip Strength  8 kg        Nutrition Therapy Plan and Nutrition Goals: Nutrition Therapy & Goals - 01/26/19 0805      Nutrition Therapy   RD appointment deferred  Yes      Personal Nutrition Goals   Comments  Patient was reminded about the RD appointment tomorrow. He continues to say he is not interested in changing his diet.       Intervention Plan   Intervention  Nutrition handout(s) given to patient.       Nutrition Assessments: Nutrition Assessments - 01/13/19 1011      MEDFICTS Scores   Pre Score  72        Nutrition Goals Re-Evaluation:   Nutrition Goals Discharge (Final Nutrition Goals Re-Evaluation):   Psychosocial: Target Goals: Acknowledge presence or absence of significant depression and/or stress, maximize coping skills, provide positive support system. Participant is able to verbalize types and ability to use techniques and skills needed for reducing stress and depression.  Initial Review & Psychosocial Screening: Initial Psych Review & Screening - 01/13/19 1022      Initial Review   Current issues with  None Identified      Family Dynamics   Good Support System?  Yes      Barriers   Psychosocial barriers to participate in program  There are no identifiable barriers or psychosocial needs.      Screening Interventions   Interventions  Encouraged to exercise       Quality of Life Scores: Quality of Life - 01/13/19 1039      Quality of Life   Select  Quality of Life      Quality of Life Scores   Health/Function Pre  18.47 %    Socioeconomic Pre  24.83 %    Psych/Spiritual Pre  25.29 %    Family Pre  19.5 %    GLOBAL Pre  21.28 %      Scores of 19 and below usually indicate a poorer quality of life in these areas.  A difference of  2-3 points is a clinically meaningful difference.  A difference of 2-3 points in the total score of the Quality of Life Index has been associated with significant improvement in overall quality of life, self-image, physical symptoms, and general health in studies assessing change in quality of life.  PHQ-9: Recent Review Flowsheet Data    Depression screen [facility] 2/9 01/13/2019   Decreased Interest 0   Down, Depressed, Hopeless 0   PHQ - 2 Score 0   Altered sleeping 0   Tired, decreased energy 2   Change in appetite 0   Feeling bad or failure about yourself  0   Trouble concentrating 0   Moving slowly or fidgety/restless 0   Suicidal thoughts 0   PHQ-9 Score 2   Difficult doing work/chores Not difficult at all      Interpretation of Total Score  Total Score Depression Severity:  1-4 = Minimal depression, 5-9 = Mild depression, 10-14 = Moderate depression, 15-19 = Moderately severe depression, 20-27 = Severe depression   Psychosocial Evaluation and Intervention: Psychosocial Evaluation - 01/13/19 1041      Psychosocial Evaluation &  Interventions   Interventions  Encouraged to exercise with the program and follow exercise prescription;Stress management education;Relaxation education    Comments  Patient has no psychosocial issues identified at orientation. His initial QOL score was 21.28 and his PHQ-9 score was 2.     Expected Outcomes  Patient will have no psychosocial issues identified at discharge.     Continue Psychosocial Services   No Follow up required       Psychosocial Re-Evaluation: Psychosocial Re-Evaluation    [address] Name 01/26/19 0807             Psychosocial Re-Evaluation   Current issues with  None Identified       Comments  Patient's initial QOL score was 21.28 and his PHQ_9 score was 1 with no pyschosocial issues identified. Will continue to monitor for progress.        Expected Outcomes  Patient will have no psychosocial issues identified at discharge.        Interventions  Stress management education;Relaxation education;Encouraged to attend Cardiac Rehabilitation for the exercise       Continue Psychosocial Services   No Follow up required          Psychosocial Discharge (Final Psychosocial Re-Evaluation): Psychosocial Re-Evaluation - 01/26/19 0807      Psychosocial Re-Evaluation   Current issues with  None Identified    Comments  Patient's initial QOL score was 21.28 and his PHQ_9 score was 1 with no pyschosocial issues identified. Will continue to monitor for progress.     Expected Outcomes  Patient will have no psychosocial issues identified at discharge.     Interventions  Stress management education;Relaxation education;Encouraged to attend Cardiac Rehabilitation  for the exercise    Continue Psychosocial Services   No Follow up required       Vocational Rehabilitation: Provide vocational rehab assistance to qualifying candidates.   Vocational Rehab Evaluation & Intervention: Vocational Rehab - 01/13/19 1018      Initial Vocational Rehab Evaluation & Intervention   Assessment shows need for Vocational Rehabilitation  No      Vocational Rehab Re-Evaulation   Comments  Patient is retired and not interested in returning to employment.        Education: Education Goals: Education classes will be provided on a weekly basis, covering required topics. Participant will state understanding/return demonstration of topics presented.  Learning Barriers/Preferences: Learning Barriers/Preferences - 01/13/19 1025      Learning Barriers/Preferences   Learning Barriers  None    Learning Preferences  Skilled Demonstration       Education Topics: Hypertension, Hypertension Reduction -Define heart disease and high blood pressure. Discus how high blood pressure affects the body and ways to reduce high blood pressure.   CARDIAC REHAB PHASE II EXERCISE from 01/25/2019 in [place]  Date  01/25/19  Educator  DJ  Instruction Review Code  2- Demonstrated Understanding      Exercise and Your Heart -Discuss why it is important to exercise, the FITT principles of exercise, normal and abnormal responses to exercise, and how to exercise safely.   Angina -Discuss definition of angina, causes of angina, treatment of angina, and how to decrease risk of having angina.   Cardiac Medications -Review what the following cardiac medications are used for, how they affect the body, and side effects that may occur when taking the medications.  Medications include Aspirin, Beta blockers, calcium channel blockers, ACE Inhibitors, angiotensin receptor blockers, diuretics, digoxin, and antihyperlipidemics.   Congestive Heart Failure -  Discuss the  definition of CHF, how to live with CHF, the signs and symptoms of CHF, and how keep track of weight and sodium intake.   Heart Disease and Intimacy -Discus the effect sexual activity has on the heart, how changes occur during intimacy as we age, and safety during sexual activity.   Smoking Cessation / COPD -Discuss different methods to quit smoking, the health benefits of quitting smoking, and the definition of COPD.   Nutrition I: Fats -Discuss the types of cholesterol, what cholesterol does to the heart, and how cholesterol levels can be controlled.   Nutrition II: Labels -Discuss the different components of food labels and how to read food label   Heart Parts/Heart Disease and PAD -Discuss the anatomy of the heart, the pathway of blood circulation through the heart, and these are affected by heart disease.   Stress I: Signs and Symptoms -Discuss the causes of stress, how stress may lead to anxiety and depression, and ways to limit stress.   Stress II: Relaxation -Discuss different types of relaxation techniques to limit stress.   Warning Signs of Stroke / TIA -Discuss definition of a stroke, what the signs and symptoms are of a stroke, and how to identify when someone is having stroke.   CARDIAC REHAB PHASE II EXERCISE from 01/25/2019 in [place]  Date  01/18/19  Educator  DJ  Instruction Review Code  2- Demonstrated Understanding      Knowledge Questionnaire Score: Knowledge Questionnaire Score - 01/13/19 1011      Knowledge Questionnaire Score   Pre Score  19/24       Core Components/Risk Factors/Patient Goals at Admission: Personal Goals and Risk Factors at Admission - 01/13/19 1020      Core Components/Risk Factors/Patient Goals on Admission    Weight Management  Weight Maintenance    Diabetes  Yes    Intervention  Provide education about signs/symptoms and action to take for hypo/hyperglycemia.;Provide education about proper  nutrition, including hydration, and aerobic/resistive exercise prescription along with prescribed medications to achieve blood glucose in normal ranges: Fasting glucose 65-99 mg/dL    Expected Outcomes  Long Term: Attainment of HbA1C < 7%.;Short Term: Participant verbalizes understanding of the signs/symptoms and immediate care of hyper/hypoglycemia, proper foot care and importance of medication, aerobic/resistive exercise and nutrition plan for blood glucose control.    Hypertension  Yes    Intervention  Provide education on lifestyle modifcations including regular physical activity/exercise, weight management, moderate sodium restriction and increased consumption of fresh fruit, vegetables, and low fat dairy, alcohol moderation, and smoking cessation.    Expected Outcomes  Short Term: Continued assessment and intervention until BP is < 140/109mm HG in hypertensive participants. < 130/57mm HG in hypertensive participants with diabetes, heart failure or chronic kidney disease.    Personal Goal Other  Yes    Personal Goal  Improve mobility; stay active.     Intervention  Patient will attend [facility] 3 days/week and will supplement with exercise 2 days/week.    Expected Outcomes  Patient will meet his personal goals.        Core Components/Risk Factors/Patient Goals Review:  Goals and Risk Factor Review    Row Name 01/26/19 0806             Core Components/Risk Factors/Patient Goals Review   Personal Goals Review  Hypertension;Diabetes;Weight Management/Obesity Improve mobility; stay active.        Review  Patient has completed 6 sessions maintaining his weight since  last 30 day review. He is doing well in the program with some progression. He is new to the program. Will continue to monitor for progress.        Expected Outcomes  Patient will continue to attend sessions and complete the program meeting his personal goals.           Core Components/Risk Factors/Patient Goals at Discharge (Final  Review):  Goals and Risk Factor Review - 01/26/19 0806      Core Components/Risk Factors/Patient Goals Review   Personal Goals Review  Hypertension;Diabetes;Weight Management/Obesity   Improve mobility; stay active.    Review  Patient has completed 6 sessions maintaining his weight since last 30 day review. He is doing well in the program with some progression. He is new to the program. Will continue to monitor for progress.     Expected Outcomes  Patient will continue to attend sessions and complete the program meeting his personal goals.        ITP Comments: ITP Comments    Row Name 01/13/19 1035           ITP Comments  Patient is an 85 year old male referred to [facility] for TVAR. He has a h/o guillain barre causing an unsteady gait and a h/o falls.           Comments: ITP REVIEW Patient doing well in the program. Will continue to monitor for progress.

## 2019-01-27 ENCOUNTER — Encounter (HOSPITAL_COMMUNITY)
Admission: RE | Admit: 2019-01-27 | Discharge: 2019-01-27 | Disposition: A | Discharge status: Home / Self Care | Payer: Medicare Other | Source: Ambulatory Visit | Attending: Cardiothoracic Surgery | Admitting: Cardiothoracic Surgery

## 2019-01-27 DIAGNOSIS — Z952 Presence of prosthetic heart valve: Secondary | ICD-10-CM

## 2019-01-27 NOTE — Progress Notes (Signed)
Daily Session Note  Patient Details  Name: BRETT DARKO MRN: [id number] Date of Birth: March 09, 1929 Referring Provider:     [place] from 01/13/2019 in [place]  Referring Provider  [name]      Encounter Date: 01/27/2019  Check In: Session Check In - 01/27/19 0815      Check-In   Supervising physician immediately available to respond to emergencies  See telemetry face sheet for immediately available ER MD    Location  [facility]    Staff Present  Benay Pike, Exercise Physiologist;Debra Wynetta Emery, RN, BSN;Diane Coad, MS, EP, [facility], Exercise Physiologist    Medication changes reported      No    Fall or balance concerns reported     Yes    Comments  Patient has a h/o falls. 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    Warm-up and Cool-down  Performed as group-led Higher education careers adviser Performed  Yes    VAD Patient?  No    PAD/SET Patient?  No      Pain Assessment   Currently in Pain?  No/denies    Pain Score  0-No pain    Multiple Pain Sites  No       Capillary Blood Glucose: No results found for this or any previous visit (from the past 24 hour(s)).    Social History   Tobacco Use  Smoking Status Never Smoker  Smokeless Tobacco Never Used    Goals Met:  Proper associated with RPD/PD & O2 Sat Independence with exercise equipment Exercise tolerated well No report of cardiac concerns or symptoms Strength training completed today  Goals Unmet:  Not Applicable  Comments: Pt able to follow exercise prescription today without complaint.  Will continue to monitor for progression. Check out 0915.   Dr. Kate Sable is Medical Director for [facility] Cardiac and Pulmonary Rehab.

## 2019-01-30 ENCOUNTER — Encounter (HOSPITAL_COMMUNITY)
Admission: RE | Admit: 2019-01-30 | Discharge: 2019-01-30 | Disposition: A | Discharge status: Home / Self Care | Payer: Medicare Other | Source: Ambulatory Visit | Attending: Cardiothoracic Surgery | Admitting: Cardiothoracic Surgery

## 2019-01-30 DIAGNOSIS — Z952 Presence of prosthetic heart valve: Secondary | ICD-10-CM | POA: Diagnosis not present

## 2019-01-30 NOTE — Progress Notes (Signed)
Daily Session Note  Patient Details  Name: CESARE SUMLIN MRN: [id number] Date of Birth: 07-29-1929 Referring Provider:     CARDIAC REHAB PHASE II ORIENTATION from 01/13/2019 in [place]  Referring Provider  [name]      Encounter Date: 01/30/2019  Check In: Session Check In - 01/30/19 0815      Check-In   Supervising physician immediately available to respond to emergencies  See telemetry face sheet for immediately available ER MD    Location  [facility]    Staff Present  Benay Pike, Exercise Physiologist;Diane Coad, MS, EP, [facility], Exercise Physiologist    Medication changes reported      No    Fall or balance concerns reported     Yes    Comments  Patient has a h/o falls. 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    Warm-up and Cool-down  Performed as group-led Higher education careers adviser Performed  Yes    VAD Patient?  No    PAD/SET Patient?  No      Pain Assessment   Currently in Pain?  No/denies    Pain Score  0-No pain    Multiple Pain Sites  No       Capillary Blood Glucose: No results found for this or any previous visit (from the past 24 hour(s)).    Social History   Tobacco Use  Smoking Status Never Smoker  Smokeless Tobacco Never Used    Goals Met:  Proper associated with RPD/PD & O2 Sat Independence with exercise equipment Exercise tolerated well No report of cardiac concerns or symptoms Strength training completed today  Goals Unmet:  Not Applicable  Comments: Pt able to follow exercise prescription today without complaint.  Will continue to monitor for progression. Check out 0915.   Dr. Kate Sable is Medical Director for [facility] Cardiac and Pulmonary Rehab.

## 2019-02-01 ENCOUNTER — Encounter (HOSPITAL_COMMUNITY)
Admission: RE | Admit: 2019-02-01 | Discharge: 2019-02-01 | Disposition: A | Discharge status: Home / Self Care | Payer: Medicare Other | Source: Ambulatory Visit | Attending: Cardiothoracic Surgery | Admitting: Cardiothoracic Surgery

## 2019-02-01 DIAGNOSIS — Z952 Presence of prosthetic heart valve: Secondary | ICD-10-CM | POA: Diagnosis not present

## 2019-02-01 NOTE — Progress Notes (Signed)
Daily Session Note  Patient Details  Name: [name] MRN: [id number] Date of Birth: 1929-12-21 Referring Provider:     CARDIAC REHAB PHASE II ORIENTATION from 01/13/2019 in [place]  Referring Provider  [name]      Encounter Date: 02/01/2019  Check In: Session Check In - 02/01/19 0815      Check-In   Supervising physician immediately available to respond to emergencies  See telemetry face sheet for immediately available ER MD    Location  [facility]    Staff Present  Benay Pike, Exercise Physiologist;Diane Coad, MS, EP, [facility], Exercise Physiologist;Francessca Friis Wynetta Emery, RN, BSN    Medication changes reported      No    Fall or balance concerns reported     Yes    Comments  Patient has a h/o falls. 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    Warm-up and Cool-down  Performed as group-led Higher education careers adviser Performed  Yes    VAD Patient?  No    PAD/SET Patient?  No      Pain Assessment   Currently in Pain?  No/denies    Pain Score  0-No pain    Multiple Pain Sites  No       Capillary Blood Glucose: No results found for this or any previous visit (from the past 24 hour(s)).    Social History   Tobacco Use  Smoking Status Never Smoker  Smokeless Tobacco Never Used    Goals Met:  Independence with exercise equipment Exercise tolerated well No report of cardiac concerns or symptoms Strength training completed today  Goals Unmet:  Not Applicable  Comments: Pt able to follow exercise prescription today without complaint.  Will continue to monitor for progression. Check out 915.   Dr. Kate Sable is Medical Director for [facility] Cardiac and Pulmonary Rehab.

## 2019-02-03 ENCOUNTER — Encounter (HOSPITAL_COMMUNITY)
Admission: RE | Admit: 2019-02-03 | Discharge: 2019-02-03 | Disposition: A | Discharge status: Home / Self Care | Payer: Medicare Other | Source: Ambulatory Visit | Attending: Cardiothoracic Surgery | Admitting: Cardiothoracic Surgery

## 2019-02-03 DIAGNOSIS — Z952 Presence of prosthetic heart valve: Secondary | ICD-10-CM

## 2019-02-03 NOTE — Progress Notes (Signed)
Daily Session Note  Patient Details  Name: [name] MRN: [id number] Date of Birth: June 07, 1929 Referring Provider:     [place] from 01/13/2019 in [place]  Referring Provider  [name]      Encounter Date: 02/03/2019  Check In: Session Check In - 02/03/19 0815      Check-In   Supervising physician immediately available to respond to emergencies  See telemetry face sheet for immediately available ER MD    Location  [facility]    Staff Present  Benay Pike, Exercise Physiologist;Diane Coad, MS, EP, [facility], Exercise Physiologist;Debra Wynetta Emery, RN, BSN    Medication changes reported      No    Fall or balance concerns reported     Yes    Comments  Patient has a h/o falls. 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    Warm-up and Cool-down  Performed as group-led Higher education careers adviser Performed  Yes    VAD Patient?  No    PAD/SET Patient?  No      Pain Assessment   Currently in Pain?  No/denies    Pain Score  0-No pain    Multiple Pain Sites  No       Capillary Blood Glucose: No results found for this or any previous visit (from the past 24 hour(s)).    Social History   Tobacco Use  Smoking Status Never Smoker  Smokeless Tobacco Never Used    Goals Met:  Independence with exercise equipment Exercise tolerated well No report of cardiac concerns or symptoms Strength training completed today  Goals Unmet:  Not Applicable  Comments: Pt able to follow exercise prescription today without complaint.  Will continue to monitor for progression. Check out 915.   Dr. Kate Sable is Medical Director for [facility] Cardiac and Pulmonary Rehab.

## 2019-02-06 ENCOUNTER — Encounter (HOSPITAL_COMMUNITY)
Admission: RE | Admit: 2019-02-06 | Discharge: 2019-02-06 | Disposition: A | Discharge status: Home / Self Care | Payer: Medicare Other | Source: Ambulatory Visit | Attending: Cardiothoracic Surgery | Admitting: Cardiothoracic Surgery

## 2019-02-06 DIAGNOSIS — Z952 Presence of prosthetic heart valve: Secondary | ICD-10-CM | POA: Diagnosis not present

## 2019-02-06 NOTE — Progress Notes (Signed)
Daily Session Note  Patient Details  Name: [name] MRN: [id number] Date of Birth: 08/17/1929 Referring Provider:     [place] from 01/13/2019 in [place]  Referring Provider  [name]      Encounter Date: 02/06/2019  Check In: Session Check In - 02/06/19 0815      Check-In   Supervising physician immediately available to respond to emergencies  See telemetry face sheet for immediately available ER MD    Location  [facility]    Staff Present  Benay Pike, Exercise Physiologist;Diane Coad, MS, EP, [facility], Exercise Physiologist;Debra Wynetta Emery, RN, BSN    Medication changes reported      No    Fall or balance concerns reported     Yes    Comments  Patient has a h/o falls. 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    Warm-up and Cool-down  Performed as group-led Higher education careers adviser Performed  Yes    VAD Patient?  No    PAD/SET Patient?  No      Pain Assessment   Currently in Pain?  No/denies    Pain Score  0-No pain    Multiple Pain Sites  No       Capillary Blood Glucose: No results found for this or any previous visit (from the past 24 hour(s)).    Social History   Tobacco Use  Smoking Status Never Smoker  Smokeless Tobacco Never Used    Goals Met:  Proper associated with RPD/PD & O2 Sat Independence with exercise equipment Exercise tolerated well No report of cardiac concerns or symptoms Strength training completed today  Goals Unmet:  Not Applicable  Comments: Pt able to follow exercise prescription today without complaint.  Will continue to monitor for progression. Check out 0915.   Dr. Kate Sable is Medical Director for [facility] Cardiac and Pulmonary Rehab.

## 2019-02-08 ENCOUNTER — Encounter (HOSPITAL_COMMUNITY)
Admission: RE | Admit: 2019-02-08 | Discharge: 2019-02-08 | Disposition: A | Discharge status: Home / Self Care | Payer: Medicare Other | Source: Ambulatory Visit | Attending: Cardiothoracic Surgery | Admitting: Cardiothoracic Surgery

## 2019-02-08 DIAGNOSIS — Z952 Presence of prosthetic heart valve: Secondary | ICD-10-CM | POA: Diagnosis not present

## 2019-02-08 NOTE — Progress Notes (Signed)
Daily Session Note  Patient Details  Name: [name] MRN: [id number] Date of Birth: [birth year] Referring Provider:     CARDIAC REHAB PHASE II ORIENTATION from 01/13/2019 in [place]  Referring Provider  [name]      Encounter Date: 02/08/2019  Check In: Session Check In - 02/08/19 0815      Check-In   Supervising physician immediately available to respond to emergencies  See telemetry face sheet for immediately available ER MD    Location  [facility]    Staff Present  Benay Pike, Exercise Physiologist;Debra Wynetta Emery, RN, BSN    Medication changes reported      No    Fall or balance concerns reported     Yes    Comments  Patient has a h/o falls. 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    Warm-up and Cool-down  Performed as group-led Higher education careers adviser Performed  Yes    VAD Patient?  No    PAD/SET Patient?  No      Pain Assessment   Currently in Pain?  No/denies    Pain Score  0-No pain    Multiple Pain Sites  No       Capillary Blood Glucose: No results found for this or any previous visit (from the past 24 hour(s)).    Social History   Tobacco Use  Smoking Status Never Smoker  Smokeless Tobacco Never Used    Goals Met:  Proper associated with RPD/PD & O2 Sat Independence with exercise equipment Exercise tolerated well No report of cardiac concerns or symptoms Strength training completed today  Goals Unmet:  Not Applicable  Comments: Pt able to follow exercise prescription today without complaint.  Will continue to monitor for progression. Check out 0915.   Dr. Kate Sable is Medical Director for [facility] Cardiac and Pulmonary Rehab.

## 2019-02-10 ENCOUNTER — Encounter (HOSPITAL_COMMUNITY)
Admission: RE | Admit: 2019-02-10 | Discharge: 2019-02-10 | Disposition: A | Discharge status: Home / Self Care | Payer: Medicare Other | Source: Ambulatory Visit | Attending: Cardiothoracic Surgery | Admitting: Cardiothoracic Surgery

## 2019-02-10 DIAGNOSIS — Z952 Presence of prosthetic heart valve: Secondary | ICD-10-CM

## 2019-02-10 NOTE — Progress Notes (Signed)
Daily Session Note  Patient Details  Name: SHREYAN HINZ MRN: [id number] Date of Birth: 05/03/1929 Referring Provider:     CARDIAC REHAB PHASE II ORIENTATION from 01/13/2019 in [place]  Referring Provider  [name]      Encounter Date: 02/10/2019  Check In: Session Check In - 02/10/19 1100      Check-In   Supervising physician immediately available to respond to emergencies  See telemetry face sheet for immediately available ER MD    Location  [facility]    Staff Present  Benay Pike, Exercise Physiologist;Shemeka Wardle Wynetta Emery, RN, BSN    Medication changes reported      No    Fall or balance concerns reported     Yes    Comments  Patient has a h/o falls. 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    Warm-up and Cool-down  Performed as group-led Higher education careers adviser Performed  Yes    VAD Patient?  No    PAD/SET Patient?  No      Pain Assessment   Currently in Pain?  No/denies    Pain Score  0-No pain    Multiple Pain Sites  No       Capillary Blood Glucose: No results found for this or any previous visit (from the past 24 hour(s)).    Social History   Tobacco Use  Smoking Status Never Smoker  Smokeless Tobacco Never Used    Goals Met:  Independence with exercise equipment Exercise tolerated well No report of cardiac concerns or symptoms Strength training completed today  Goals Unmet:  Not Applicable  Comments: Pt able to follow exercise prescription today without complaint.  Will continue to monitor for progression. Check out 1200.   Dr. Kate Sable is Medical Director for [facility] Cardiac and Pulmonary Rehab.

## 2019-02-13 ENCOUNTER — Encounter (HOSPITAL_COMMUNITY)
Admission: RE | Admit: 2019-02-13 | Discharge: 2019-02-13 | Disposition: A | Discharge status: Home / Self Care | Payer: Medicare Other | Source: Ambulatory Visit | Attending: Cardiothoracic Surgery | Admitting: Cardiothoracic Surgery

## 2019-02-13 DIAGNOSIS — Z952 Presence of prosthetic heart valve: Secondary | ICD-10-CM | POA: Diagnosis not present

## 2019-02-13 NOTE — Progress Notes (Signed)
Daily Session Note  Patient Details  Name: MAXIME BECKNER MRN: [id number] Date of Birth: [birth year] Referring Provider:     CARDIAC REHAB PHASE II ORIENTATION from 01/13/2019 in [place]  Referring Provider  [name]      Encounter Date: 02/13/2019  Check In: Session Check In - 02/13/19 0815      Check-In   Supervising physician immediately available to respond to emergencies  See telemetry face sheet for immediately available ER MD    Location  [facility]    Staff Present  Benay Pike, Exercise Physiologist;Debra Wynetta Emery, RN, BSN    Medication changes reported      No    Fall or balance concerns reported     Yes    Comments  Patient has a h/o falls. 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    Warm-up and Cool-down  Performed as group-led Higher education careers adviser Performed  Yes    VAD Patient?  No    PAD/SET Patient?  No      Pain Assessment   Currently in Pain?  No/denies    Pain Score  0-No pain    Multiple Pain Sites  No       Capillary Blood Glucose: No results found for this or any previous visit (from the past 24 hour(s)).    Social History   Tobacco Use  Smoking Status Never Smoker  Smokeless Tobacco Never Used    Goals Met:  Proper associated with RPD/PD & O2 Sat Independence with exercise equipment Exercise tolerated well No report of cardiac concerns or symptoms Strength training completed today  Goals Unmet:  Not Applicable  Comments: Pt able to follow exercise prescription today without complaint.  Will continue to monitor for progression. Check out 0915.   Dr. Kate Sable is Medical Director for [facility] Cardiac and Pulmonary Rehab.

## 2019-02-15 ENCOUNTER — Encounter (HOSPITAL_COMMUNITY)
Admission: RE | Admit: 2019-02-15 | Discharge: 2019-02-15 | Disposition: A | Discharge status: Home / Self Care | Payer: Medicare Other | Source: Ambulatory Visit | Attending: Cardiothoracic Surgery | Admitting: Cardiothoracic Surgery

## 2019-02-15 DIAGNOSIS — Z952 Presence of prosthetic heart valve: Secondary | ICD-10-CM | POA: Diagnosis not present

## 2019-02-15 NOTE — Progress Notes (Signed)
Daily Session Note  Patient Details  Name: [name] MRN: [id number] Date of Birth: November 27, 1929 Referring Provider:     CARDIAC REHAB PHASE II ORIENTATION from 01/13/2019 in [place]  Referring Provider  [name]      Encounter Date: 02/15/2019  Check In: Session Check In - 02/15/19 0815      Check-In   Supervising physician immediately available to respond to emergencies  See telemetry face sheet for immediately available ER MD    Location  [facility]    Staff Present  Benay Pike, Exercise Physiologist;Debra Wynetta Emery, RN, BSN    Medication changes reported      No    Fall or balance concerns reported     Yes    Comments  Patient has a h/o falls. 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    Warm-up and Cool-down  Performed as group-led Higher education careers adviser Performed  Yes    VAD Patient?  No    PAD/SET Patient?  No      Pain Assessment   Currently in Pain?  No/denies    Pain Score  0-No pain    Multiple Pain Sites  No       Capillary Blood Glucose: No results found for this or any previous visit (from the past 24 hour(s)).    Social History   Tobacco Use  Smoking Status Never Smoker  Smokeless Tobacco Never Used    Goals Met:  Proper associated with RPD/PD & O2 Sat Independence with exercise equipment Exercise tolerated well No report of cardiac concerns or symptoms Strength training completed today  Goals Unmet:  Not Applicable  Comments: Pt able to follow exercise prescription today without complaint.  Will continue to monitor for progression. Check out 0915.   Dr. Kate Sable is Medical Director for [facility] Cardiac and Pulmonary Rehab.

## 2019-02-17 ENCOUNTER — Encounter (HOSPITAL_COMMUNITY)
Admission: RE | Admit: 2019-02-17 | Discharge: 2019-02-17 | Disposition: A | Discharge status: Home / Self Care | Payer: Medicare Other | Source: Ambulatory Visit | Attending: Cardiothoracic Surgery | Admitting: Cardiothoracic Surgery

## 2019-02-17 DIAGNOSIS — Z952 Presence of prosthetic heart valve: Secondary | ICD-10-CM | POA: Diagnosis not present

## 2019-02-17 NOTE — Progress Notes (Signed)
Daily Session Note  Patient Details  Name: VICENT FEBLES MRN: [id number] Date of Birth: 05-07-1929 Referring Provider:     CARDIAC REHAB PHASE II ORIENTATION from 01/13/2019 in [place]  Referring Provider  [name]      Encounter Date: 02/17/2019  Check In: Session Check In - 02/17/19 0815      Check-In   Supervising physician immediately available to respond to emergencies  See telemetry face sheet for immediately available ER MD    Location  [facility]    Staff Present  Aundra Dubin, RN, BSN;Diane Coad, MS, EP, [facility], Exercise Physiologist;Other    Medication changes reported      No    Fall or balance concerns reported     Yes    Comments  Patient has a h/o falls. 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    Warm-up and Cool-down  Performed as group-led Higher education careers adviser Performed  Yes    VAD Patient?  No    PAD/SET Patient?  No      Pain Assessment   Currently in Pain?  No/denies    Pain Score  0-No pain    Multiple Pain Sites  No       Capillary Blood Glucose: No results found for this or any previous visit (from the past 24 hour(s)).    Social History   Tobacco Use  Smoking Status Never Smoker  Smokeless Tobacco Never Used    Goals Met:  Independence with exercise equipment Exercise tolerated well No report of cardiac concerns or symptoms Strength training completed today  Goals Unmet:  Not Applicable  Comments: Pt able to follow exercise prescription today without complaint.  Will continue to monitor for progression. Check out 915.   Dr. Kate Sable is Medical Director for [facility] Cardiac and Pulmonary Rehab.

## 2019-02-20 ENCOUNTER — Encounter (HOSPITAL_COMMUNITY)
Admission: RE | Admit: 2019-02-20 | Discharge: 2019-02-20 | Disposition: A | Discharge status: Home / Self Care | Payer: Medicare Other | Source: Ambulatory Visit | Attending: Cardiothoracic Surgery | Admitting: Cardiothoracic Surgery

## 2019-02-20 DIAGNOSIS — Z952 Presence of prosthetic heart valve: Secondary | ICD-10-CM

## 2019-02-20 NOTE — Progress Notes (Signed)
Daily Session Note  Patient Details  Name: [name] MRN: [id number] Date of Birth: 1929-10-16 Referring Provider:     [place] from 01/13/2019 in [place]  Referring Provider  [place]      Encounter Date: 02/20/2019  Check In: Session Check In - 02/20/19 0815      Check-In   Supervising physician immediately available to respond to emergencies  See telemetry face sheet for immediately available ER MD    Location  [facility]    Staff Present  Aundra Dubin, RN, BSN;Other;Agustus Mane, Exercise Physiologist    Medication changes reported      No    Fall or balance concerns reported     Yes    Comments  Patient has a h/o falls. 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    Warm-up and Cool-down  Performed as group-led Higher education careers adviser Performed  Yes    VAD Patient?  No    PAD/SET Patient?  No      Pain Assessment   Currently in Pain?  No/denies    Pain Score  0-No pain    Multiple Pain Sites  No       Capillary Blood Glucose: No results found for this or any previous visit (from the past 24 hour(s)).    Social History   Tobacco Use  Smoking Status Never Smoker  Smokeless Tobacco Never Used    Goals Met:  Proper associated with RPD/PD & O2 Sat Independence with exercise equipment Exercise tolerated well No report of cardiac concerns or symptoms Strength training completed today  Goals Unmet:  Not Applicable  Comments: Pt able to follow exercise prescription today without complaint.  Will continue to monitor for progression. Check out 0915.   Dr. Kate Sable is Medical Director for [facility] Cardiac and Pulmonary Rehab.

## 2019-02-22 ENCOUNTER — Encounter (HOSPITAL_COMMUNITY)
Admission: RE | Admit: 2019-02-22 | Discharge: 2019-02-22 | Disposition: A | Discharge status: Home / Self Care | Payer: Medicare Other | Source: Ambulatory Visit | Attending: Cardiothoracic Surgery | Admitting: Cardiothoracic Surgery

## 2019-02-22 DIAGNOSIS — Z952 Presence of prosthetic heart valve: Secondary | ICD-10-CM

## 2019-02-22 NOTE — Progress Notes (Signed)
Cardiac Individual Treatment Plan  Patient Details  Name: [name] MRN: [id number] Date of Birth: [birth year] Referring Provider:     CARDIAC REHAB [place] from 01/13/2019 in [place]  Referring Provider  [place]      Initial Encounter Date:    CARDIAC REHAB PHASE II ORIENTATION from 01/13/2019 in [place]  Date  01/13/19      Visit Diagnosis: S/P TAVR (transcatheter aortic valve replacement)  Patient's Home Medications on Admission:  Current Outpatient Medications:  .  aspirin 81 MG tablet, Take 81 mg by mouth every evening. , Disp: , Rfl:  .  clopidogrel (PLAVIX) 75 MG tablet, Take 75 mg by mouth daily., Disp: , Rfl:  .  ibuprofen (ADVIL,MOTRIN) 200 MG tablet, Take 400 mg by mouth every 6 (six) hours as needed for moderate pain., Disp: , Rfl:  .  latanoprost (XALATAN) 0.005 % ophthalmic solution, Place 1 drop into both eyes at bedtime., Disp: , Rfl:  .  metFORMIN (GLUCOPHAGE) 500 MG tablet, Take 500 mg by mouth 2 (two) times daily with a meal., Disp: , Rfl:  .  pyridOXINE (VITAMIN B-6) 100 MG tablet, Take 100 mg by mouth daily., Disp: , Rfl:  .  ramipril (ALTACE) 10 MG tablet, Take 10 mg by mouth daily., Disp: , Rfl:  .  simvastatin (ZOCOR) 20 MG tablet, Take 20 mg by mouth at bedtime., Disp: , Rfl:  .  vitamin B-12 (CYANOCOBALAMIN) 1000 MCG tablet, Take 1,000 mcg by mouth daily., Disp: , Rfl:   Past Medical History: Past Medical History:  Diagnosis Date  . Aortic stenosis   . Essential hypertension   . Glaucoma   . Guillain-Barre syndrome ([place]) 06/2013   Treated at [facility]  . History of nephrolithiasis   . History of pneumonia   . Hyperlipidemia   . Patent ductus arteriosus    Surgically repaired at age 85  . Prostate cancer ([place])   . Type 2 diabetes mellitus (H[facility])     Tobacco Use: Social History   Tobacco Use  Smoking Status Never Smoker  Smokeless Tobacco Never Used    Labs: Recent Review  Flowsheet Data    There is no flowsheet data to display.      Capillary Blood Glucose: Lab Results  Component Value Date   GLUCAP 134 (H) 11/03/2016   GLUCAP 110 (H) 10/13/2016   GLUCAP 134 (H) 06/12/2014   GLUCAP 135 (H) 09/11/2013   GLUCAP 124 (H) 09/08/2013     Exercise Target Goals: Exercise Program Goal: Individual exercise prescription set using results from initial 6 min walk test and THRR while considering  patient's activity barriers and safety.   Exercise Prescription Goal: Starting with aerobic activity 30 plus minutes a day, 3 days per week for initial exercise prescription. Provide home exercise prescription and guidelines that participant acknowledges understanding prior to discharge.  Activity Barriers & Risk Stratification: Activity Barriers & Cardiac Risk Stratification - 01/13/19 1035      Activity Barriers & Cardiac Risk Stratification   Activity Barriers  Balance Concerns;Muscular Weakness;Joint Problems;Other (comment)    Comments  knee pain     Cardiac Risk Stratification  High       6 Minute Walk: 6 Minute Walk    Row Name 01/13/19 1034         6 Minute Walk   Phase  Initial     Distance  770 feet     Walk Time  6 minutes     #  of Rest Breaks  0     MPH  1.46     METS  2.11     RPE  9     Perceived Dyspnea   8     VO2 Peak  3.78     Symptoms  No     Resting HR  74 bpm     Resting BP  150/66     Resting Oxygen Saturation   94 %     Exercise Oxygen Saturation  during 6 min walk  96 %     Max Ex. HR  97 bpm     Max Ex. BP  146/62     2 Minute Post BP  122/64        Oxygen Initial Assessment:   Oxygen Re-Evaluation:   Oxygen Discharge (Final Oxygen Re-Evaluation):   Initial Exercise Prescription: Initial Exercise Prescription - 01/13/19 1000      Date of Initial Exercise RX and Referring Provider   Date  01/13/19    Referring Provider  [name]    Expected Discharge Date  04/14/19      NuStep   Level  1    SPM  58     Minutes  17    METs  1.8      Arm Ergometer   Level  1    Watts  11    RPM  51    Minutes  17    METs  1.8      Prescription Details   Frequency (times per week)  3    Duration  Progress to 30 minutes of continuous aerobic without signs/symptoms of physical distress      Intensity   THRR 40-80% of Max Heartrate  [phone]    Ratings of Perceived Exertion  11-13    Perceived Dyspnea  0-4      Progression   Progression  Continue to progress workloads to maintain intensity without signs/symptoms of physical distress.      Resistance Training   Training Prescription  Yes    Weight  1    Reps  10-15       Perform Capillary Blood Glucose checks as needed.  Exercise Prescription Changes:  Exercise Prescription Changes    Row Name 01/17/19 1000 02/03/19 1400 02/14/19 0800         Response to Exercise   Blood Pressure (Admit)  132/64  140/68  138/60     Blood Pressure (Exercise)  180/62  180/70  168/62     Blood Pressure (Exit)  144/72  160/50  128/52     Heart Rate (Admit)  83 bpm  98 bpm  78 bpm     Heart Rate (Exercise)  115 bpm  113 bpm  119 bpm     Heart Rate (Exit)  92 bpm  92 bpm  87 bpm     Rating of Perceived Exertion (Exercise)  '11  11  11     '$ Comments  First day of exercise!   first two weeks of exercise  -     Duration  Continue with 30 min of aerobic exercise without signs/symptoms of physical distress.  Continue with 30 min of aerobic exercise without signs/symptoms of physical distress.  Continue with 30 min of aerobic exercise without signs/symptoms of physical distress.     Intensity  THRR New 97-109-121  THRR unchanged  THRR unchanged       Progression   Progression  Continue to progress workloads to  maintain intensity without signs/symptoms of physical distress.  Continue to progress workloads to maintain intensity without signs/symptoms of physical distress.  Continue to progress workloads to maintain intensity without signs/symptoms of physical  distress.     Average METs  2.05  2.5  2.55       Resistance Training   Training Prescription  Yes  Yes  Yes     Weight  '1  1  1     '$ Reps  10-15  10-15  10-15       NuStep   Level  '1  1  2     '$ SPM  81  102  98     Minutes  '17  17  17     '$ METs  2.2  3  2.4       Arm Ergometer   Level  '1  1  2     '$ Watts  '12  14  22     '$ RPM  54  62  59     Minutes  '22  22  22     '$ METs  1.9  2  2.7       Home Exercise Plan   Plans to continue exercise at  Home (comment)  Home (comment)  Home (comment)     Frequency  Add 2 additional days to program exercise sessions.  Add 2 additional days to program exercise sessions.  Add 2 additional days to program exercise sessions.     Initial Home Exercises Provided  01/13/19  01/13/19  01/13/19        Exercise Comments:  Exercise Comments    Row Name 01/24/19 0801 02/21/19 0743         Exercise Comments  Pt. is new to CR. He has attended 5 sessions so far. He has tolerated the NuStep and Arm Ergometer well. We will continue to monitor and progress as he increases strength.   Pt. has attended 17 sessions in the  program. He is consistant which has helped him to get in a routine as well as see progress in his strength. He states he feels stronger since beginning the program. He would still like to improve his balance and mobility the best he can.          Exercise Goals and Review:  Exercise Goals    Row Name 01/13/19 1038             Exercise Goals   Increase Physical Activity  Yes       Intervention  Provide advice, education, support and counseling about physical activity/exercise needs.;Develop an individualized exercise prescription for aerobic and resistive training based on initial evaluation findings, risk stratification, comorbidities and participant's personal goals.       Expected Outcomes  Short Term: Attend rehab on a regular basis to increase amount of physical activity.       Increase Strength and Stamina  Yes       Intervention   Provide advice, education, support and counseling about physical activity/exercise needs.       Expected Outcomes  Short Term: Increase workloads from initial exercise prescription for resistance, speed, and METs.       Able to understand and use rate of perceived exertion (RPE) scale  Yes       Intervention  Provide education and explanation on how to use RPE scale       Expected Outcomes  Short Term: Able to use RPE daily in rehab to  express subjective intensity level;Long Term:  Able to use RPE to guide intensity level when exercising independently       Able to understand and use Dyspnea scale  Yes       Intervention  Provide education and explanation on how to use Dyspnea scale       Expected Outcomes  Short Term: Able to use Dyspnea scale daily in rehab to express subjective sense of shortness of breath during exertion;Long Term: Able to use Dyspnea scale to guide intensity level when exercising independently       Knowledge and understanding of Target Heart Rate Range (THRR)  Yes       Intervention  Provide education and explanation of THRR including how the numbers were predicted and where they are located for reference       Expected Outcomes  Short Term: Able to state/look up THRR       Able to check pulse independently  Yes       Intervention  Provide education and demonstration on how to check pulse in carotid and radial arteries.;Review the importance of being able to check your own pulse for safety during independent exercise       Expected Outcomes  Short Term: Able to explain why pulse checking is important during independent exercise;Long Term: Able to check pulse independently and accurately       Understanding of Exercise Prescription  Yes       Intervention  Provide education, explanation, and written materials on patient's individual exercise prescription       Expected Outcomes  Short Term: Able to explain program exercise prescription;Long Term: Able to explain home exercise  prescription to exercise independently          Exercise Goals Re-Evaluation : Exercise Goals Re-Evaluation    Row Name 01/24/19 0759 02/21/19 0741           Exercise Goal Re-Evaluation   Exercise Goals Review  Increase Physical Activity;Increase Strength and Stamina;Able to understand and use rate of perceived exertion (RPE) scale;Knowledge and understanding of Target Heart Rate Range (THRR);Able to check pulse independently;Understanding of Exercise Prescription  Increase Physical Activity;Increase Strength and Stamina;Able to understand and use rate of perceived exertion (RPE) scale;Knowledge and understanding of Target Heart Rate Range (THRR);Able to check pulse independently;Understanding of Exercise Prescription      Comments  Pt. is still fairly new to the program. He has attended 5 sessions so far including his orientation. He has been able to tolerate the exercise well. We will monitor and progress as needed.  Pt. is doing great in the program. He is tolerating all the exercise well and has shown his ability to work hard to increase his overall MET level every session.      Expected Outcomes  improve mobility to stay active.   improve balance and mobility to stay active.           Discharge Exercise Prescription (Final Exercise Prescription Changes): Exercise Prescription Changes - 02/14/19 0800      Response to Exercise   Blood Pressure (Admit)  138/60    Blood Pressure (Exercise)  168/62    Blood Pressure (Exit)  128/52    Heart Rate (Admit)  78 bpm    Heart Rate (Exercise)  119 bpm    Heart Rate (Exit)  87 bpm    Rating of Perceived Exertion (Exercise)  11    Duration  Continue with 30 min of aerobic exercise without signs/symptoms of physical distress.  Intensity  THRR unchanged      Progression   Progression  Continue to progress workloads to maintain intensity without signs/symptoms of physical distress.    Average METs  2.55      Resistance Training   Training  Prescription  Yes    Weight  1    Reps  10-15      NuStep   Level  2    SPM  98    Minutes  17    METs  2.4      Arm Ergometer   Level  2    Watts  22    RPM  59    Minutes  22    METs  2.7      Home Exercise Plan   Plans to continue exercise at  Home (comment)    Frequency  Add 2 additional days to program exercise sessions.    Initial Home Exercises Provided  01/13/19       Nutrition:  Target Goals: Understanding of nutrition guidelines, daily intake of sodium '1500mg'$ , cholesterol '200mg'$ , calories 30% from fat and 7% or less from saturated fats, daily to have 5 or more servings of fruits and vegetables.  Biometrics: Pre Biometrics - 01/13/19 1038      Pre Biometrics   Height  '5\' 9"'$  (1.753 m)    Weight  88.7 kg    Waist Circumference  43 inches    Hip Circumference  41.5 inches    Waist to Hip Ratio  1.04 %    BMI (Calculated)  28.87    Triceps Skinfold  10 mm    % Body Fat  23.3 %    Grip Strength  8 kg        Nutrition Therapy Plan and Nutrition Goals: Nutrition Therapy & Goals - 02/22/19 1447      Nutrition Therapy   RD appointment deferred  Yes      Personal Nutrition Goals   Comments  Patient was reminded about RD appointment tomorrow. He states he is eating fiber ceral instead of ceral with a lot of sugar. Will continue to monitor for progress.       Intervention Plan   Intervention  Nutrition handout(s) given to patient.       Nutrition Assessments: Nutrition Assessments - 01/13/19 1011      MEDFICTS Scores   Pre Score  72       Nutrition Goals Re-Evaluation:   Nutrition Goals Discharge (Final Nutrition Goals Re-Evaluation):   Psychosocial: Target Goals: Acknowledge presence or absence of significant depression and/or stress, maximize coping skills, provide positive support system. Participant is able to verbalize types and ability to use techniques and skills needed for reducing stress and depression.  Initial Review & Psychosocial  Screening: Initial Psych Review & Screening - 01/13/19 1022      Initial Review   Current issues with  None Identified      Family Dynamics   Good Support System?  Yes      Barriers   Psychosocial barriers to participate in program  There are no identifiable barriers or psychosocial needs.      Screening Interventions   Interventions  Encouraged to exercise       Quality of Life Scores: Quality of Life - 01/13/19 1039      Quality of Life   Select  Quality of Life      Quality of Life Scores   Health/Function Pre  18.47 %  Socioeconomic Pre  24.83 %    Psych/Spiritual Pre  25.29 %    Family Pre  19.5 %    GLOBAL Pre  21.28 %      Scores of 19 and below usually indicate a poorer quality of life in these areas.  A difference of  2-3 points is a clinically meaningful difference.  A difference of 2-3 points in the total score of the Quality of Life Index has been associated with significant improvement in overall quality of life, self-image, physical symptoms, and general health in studies assessing change in quality of life.  PHQ-9: Recent Review Flowsheet Data    Depression screen [facility] 2/9 01/13/2019   Decreased Interest 0   Down, Depressed, Hopeless 0   PHQ - 2 Score 0   Altered sleeping 0   Tired, decreased energy 2   Change in appetite 0   Feeling bad or failure about yourself  0   Trouble concentrating 0   Moving slowly or fidgety/restless 0   Suicidal thoughts 0   PHQ-9 Score 2   Difficult doing work/chores Not difficult at all     Interpretation of Total Score  Total Score Depression Severity:  1-4 = Minimal depression, 5-9 = Mild depression, 10-14 = Moderate depression, 15-19 = Moderately severe depression, 20-27 = Severe depression   Psychosocial Evaluation and Intervention: Psychosocial Evaluation - 01/13/19 1041      Psychosocial Evaluation & Interventions   Interventions  Encouraged to exercise with the program and follow exercise prescription;Stress  management education;Relaxation education    Comments  Patient has no psychosocial issues identified at orientation. His initial QOL score was 21.28 and his PHQ-9 score was 2.     Expected Outcomes  Patient will have no psychosocial issues identified at discharge.     Continue Psychosocial Services   No Follow up required       Psychosocial Re-Evaluation: Psychosocial Re-Evaluation    [place] Name 01/26/19 0807 02/22/19 1454           Psychosocial Re-Evaluation   Current issues with  None Identified  None Identified      Comments  Patient's initial QOL score was 21.28 and his PHQ_9 score was 1 with no pyschosocial issues identified. Will continue to monitor for progress.   Patient's initial QOL score was 21.28 and his PHQ_9 score was 1 with no pyschosocial issues identified. Will continue to monitor for progress.       Expected Outcomes  Patient will have no psychosocial issues identified at discharge.   Patient will have no psychosocial issues identified at discharge.       Interventions  Stress management education;Relaxation education;Encouraged to attend Cardiac Rehabilitation for the exercise  Stress management education;Relaxation education;Encouraged to attend Cardiac Rehabilitation for the exercise      Continue Psychosocial Services   No Follow up required  No Follow up required         Psychosocial Discharge (Final Psychosocial Re-Evaluation): Psychosocial Re-Evaluation - 02/22/19 1454      Psychosocial Re-Evaluation   Current issues with  None Identified    Comments  Patient's initial QOL score was 21.28 and his PHQ_9 score was 1 with no pyschosocial issues identified. Will continue to monitor for progress.     Expected Outcomes  Patient will have no psychosocial issues identified at discharge.     Interventions  Stress management education;Relaxation education;Encouraged to attend Cardiac Rehabilitation for the exercise    Continue Psychosocial Services   No Follow up  required        Vocational Rehabilitation: Provide vocational rehab assistance to qualifying candidates.   Vocational Rehab Evaluation & Intervention: Vocational Rehab - 01/13/19 1018      Initial Vocational Rehab Evaluation & Intervention   Assessment shows need for Vocational Rehabilitation  No      Vocational Rehab Re-Evaulation   Comments  Patient is retired and not interested in returning to employment.        Education: Education Goals: Education classes will be provided on a weekly basis, covering required topics. Participant will state understanding/return demonstration of topics presented.  Learning Barriers/Preferences: Learning Barriers/Preferences - 01/13/19 1025      Learning Barriers/Preferences   Learning Barriers  None    Learning Preferences  Skilled Demonstration       Education Topics: Hypertension, Hypertension Reduction -Define heart disease and high blood pressure. Discus how high blood pressure affects the body and ways to reduce high blood pressure.   CARDIAC REHAB PHASE II EXERCISE from 02/22/2019 in [place]  Date  01/25/19  Educator  DJ  Instruction Review Code  2- Demonstrated Understanding      Exercise and Your Heart -Discuss why it is important to exercise, the FITT principles of exercise, normal and abnormal responses to exercise, and how to exercise safely.   CARDIAC REHAB PHASE II EXERCISE from 02/22/2019 in [place]  Date  02/01/19  Educator  Wynetta Emery  Instruction Review Code  2- Demonstrated Understanding      Angina -Discuss definition of angina, causes of angina, treatment of angina, and how to decrease risk of having angina.   CARDIAC REHAB PHASE II EXERCISE from 02/22/2019 in [place]  Date  02/08/19  Educator  Wynetta Emery  Instruction Review Code  2- Demonstrated Understanding      Cardiac Medications -Review what the following cardiac medications are used for, how  they affect the body, and side effects that may occur when taking the medications.  Medications include Aspirin, Beta blockers, calcium channel blockers, ACE Inhibitors, angiotensin receptor blockers, diuretics, digoxin, and antihyperlipidemics.   CARDIAC REHAB PHASE II EXERCISE from 02/22/2019 in [place]  Date  02/15/19  Educator  Wynetta Emery  Instruction Review Code  2- Demonstrated Understanding      Congestive Heart Failure -Discuss the definition of CHF, how to live with CHF, the signs and symptoms of CHF, and how keep track of weight and sodium intake.   CARDIAC REHAB PHASE II EXERCISE from 02/22/2019 in [place]  Date  02/22/19  Educator  Wynetta Emery  Instruction Review Code  2- Demonstrated Understanding      Heart Disease and Intimacy -Discus the effect sexual activity has on the heart, how changes occur during intimacy as we age, and safety during sexual activity.   Smoking Cessation / COPD -Discuss different methods to quit smoking, the health benefits of quitting smoking, and the definition of COPD.   Nutrition I: Fats -Discuss the types of cholesterol, what cholesterol does to the heart, and how cholesterol levels can be controlled.   Nutrition II: Labels -Discuss the different components of food labels and how to read food label   Heart Parts/Heart Disease and PAD -Discuss the anatomy of the heart, the pathway of blood circulation through the heart, and these are affected by heart disease.   Stress I: Signs and Symptoms -Discuss the causes of stress, how stress may lead to anxiety and depression, and ways to limit  stress.   Stress II: Relaxation -Discuss different types of relaxation techniques to limit stress.   Warning Signs of Stroke / TIA -Discuss definition of a stroke, what the signs and symptoms are of a stroke, and how to identify when someone is having stroke.   CARDIAC REHAB PHASE II EXERCISE from 02/22/2019  in [place]  Date  01/18/19  Educator  DJ  Instruction Review Code  2- Demonstrated Understanding      Knowledge Questionnaire Score: Knowledge Questionnaire Score - 01/13/19 1011      Knowledge Questionnaire Score   Pre Score  19/24       Core Components/Risk Factors/Patient Goals at Admission: Personal Goals and Risk Factors at Admission - 01/13/19 1020      Core Components/Risk Factors/Patient Goals on Admission    Weight Management  Weight Maintenance    Diabetes  Yes    Intervention  Provide education about signs/symptoms and action to take for hypo/hyperglycemia.;Provide education about proper nutrition, including hydration, and aerobic/resistive exercise prescription along with prescribed medications to achieve blood glucose in normal ranges: Fasting glucose 65-99 mg/dL    Expected Outcomes  Long Term: Attainment of HbA1C < 7%.;Short Term: Participant verbalizes understanding of the signs/symptoms and immediate care of hyper/hypoglycemia, proper foot care and importance of medication, aerobic/resistive exercise and nutrition plan for blood glucose control.    Hypertension  Yes    Intervention  Provide education on lifestyle modifcations including regular physical activity/exercise, weight management, moderate sodium restriction and increased consumption of fresh fruit, vegetables, and low fat dairy, alcohol moderation, and smoking cessation.    Expected Outcomes  Short Term: Continued assessment and intervention until BP is < 140/40m HG in hypertensive participants. < 130/876mHG in hypertensive participants with diabetes, heart failure or chronic kidney disease.    Personal Goal Other  Yes    Personal Goal  Improve mobility; stay active.     Intervention  Patient will attend CR 3 days/week and will supplement with exercise 2 days/week.    Expected Outcomes  Patient will meet his personal goals.        Core Components/Risk Factors/Patient Goals  Review:  Goals and Risk Factor Review    Row Name 01/26/19 0806 02/22/19 1448           Core Components/Risk Factors/Patient Goals Review   Personal Goals Review  Hypertension;Diabetes;Weight Management/Obesity Improve mobility; stay active.   Hypertension;Diabetes;Weight Management/Obesity Improve mobility; stay active.       Review  Patient has completed 6 sessions maintaining his weight since last 30 day review. He is doing well in the program with some progression. He is new to the program. Will continue to monitor for progress.   Patient has completed 18 session gaining 1 lb since last 30 day review. He is doing well in the program with progression. 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 His last A1C was in February 2020 at 5.'9mg'$ /dl. His reported fasting glucose readings average 130 mg/dl. Will continue to monitor for progress.       Expected Outcomes  Patient will continue to attend sessions and complete the program meeting his personal goals.   Patient will continue to attend sessions and complete the program meeting his personal goals.          Core Components/Risk Factors/Patient Goals at Discharge (Final Review):  Goals and Risk Factor Review - 02/22/19 1448  Core Components/Risk Factors/Patient Goals Review   Personal Goals Review  Hypertension;Diabetes;Weight Management/Obesity   Improve mobility; stay active.    Review  Patient has completed 18 session gaining 1 lb since last 30 day review. He is doing well in the program with progression. 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 His last A1C was in February 2020 at 5.'9mg'$ /dl. His reported fasting glucose readings average 130 mg/dl. Will continue to monitor for progress.     Expected Outcomes  Patient will continue to attend sessions and  complete the program meeting his personal goals.        ITP Comments: ITP Comments    Row Name 01/13/19 1035           ITP Comments  Patient is an 85 year old male referred to [facility] for TVAR. He has a h/o guillain barre causing an unsteady gait and a h/o falls.           Comments: ITP REVIEW Patient doing well in the program. Will continue to monitor for progress.

## 2019-02-22 NOTE — Progress Notes (Addendum)
Daily Session Note  Patient Details  Name: [name] MRN: [id number] Date of Birth: 1929/01/31 Referring Provider:     CARDIAC REHAB PHASE II ORIENTATION from 01/13/2019 in [place]  Referring Provider  [name]      Encounter Date: 02/22/2019  Check In: Session Check In - 02/22/19 0815      Check-In   Supervising physician immediately available to respond to emergencies  See telemetry face sheet for immediately available ER MD    Location  [facility]    Staff Present  Aundra Dubin, RN, Cory Munch, Exercise Physiologist;Diane Coad, MS, EP, [facility], Exercise Physiologist    Medication changes reported      Yes    Comments  MD added Lisinopril 10 mg daily 02/15/19.    Fall or balance concerns reported     Yes    Comments  Patient has a h/o falls. 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    Warm-up and Cool-down  Performed as group-led Higher education careers adviser Performed  Yes    VAD Patient?  No    PAD/SET Patient?  No      Pain Assessment   Currently in Pain?  No/denies    Pain Score  0-No pain    Multiple Pain Sites  No       Capillary Blood Glucose: No results found for this or any previous visit (from the past 24 hour(s)).    Social History   Tobacco Use  Smoking Status Never Smoker  Smokeless Tobacco Never Used    Goals Met:  Independence with exercise equipment Exercise tolerated well No report of cardiac concerns or symptoms Strength training completed today  Goals Unmet:  Not Applicable  Comments: Pt able to follow exercise prescription today without complaint.  Will continue to monitor for progression. Check out 915.   Dr. Kate Sable is Medical Director for [facility] Cardiac and Pulmonary Rehab.

## 2019-02-24 ENCOUNTER — Encounter (HOSPITAL_COMMUNITY)
Admission: RE | Admit: 2019-02-24 | Discharge: 2019-02-24 | Disposition: A | Discharge status: Home / Self Care | Payer: Medicare Other | Source: Ambulatory Visit | Attending: Cardiothoracic Surgery | Admitting: Cardiothoracic Surgery

## 2019-02-24 DIAGNOSIS — Z952 Presence of prosthetic heart valve: Secondary | ICD-10-CM

## 2019-02-24 NOTE — Progress Notes (Signed)
Daily Session Note  Patient Details  Name: [name] MRN: [id number] Date of Birth: 10-29-1929 Referring Provider:     CARDIAC REHAB PHASE II ORIENTATION from 01/13/2019 in [place]  Referring Provider  [name]      Encounter Date: 02/24/2019  Check In: Session Check In - 02/24/19 0815      Check-In   Supervising physician immediately available to respond to emergencies  See telemetry face sheet for immediately available ER MD    Location  [facility]    Staff Present  Benay Pike, Exercise Physiologist;Diane Coad, MS, EP, [facility], Exercise Physiologist;Other    Medication changes reported      No    Fall or balance concerns reported     Yes    Comments  Patient has a h/o falls. 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    Warm-up and Cool-down  Performed as group-led Higher education careers adviser Performed  Yes    VAD Patient?  No    PAD/SET Patient?  No      Pain Assessment   Currently in Pain?  No/denies    Pain Score  0-No pain    Multiple Pain Sites  No       Capillary Blood Glucose: No results found for this or any previous visit (from the past 24 hour(s)).    Social History   Tobacco Use  Smoking Status Never Smoker  Smokeless Tobacco Never Used    Goals Met:  Proper associated with RPD/PD & O2 Sat Independence with exercise equipment Exercise tolerated well No report of cardiac concerns or symptoms Strength training completed today  Goals Unmet:  Not Applicable  Comments: Pt able to follow exercise prescription today without complaint.  Will continue to monitor for progression. Check out 0915.   Dr. Kate Sable is Medical Director for [facility] Cardiac and Pulmonary Rehab.

## 2019-02-27 ENCOUNTER — Encounter (HOSPITAL_COMMUNITY)
Admission: RE | Admit: 2019-02-27 | Discharge: 2019-02-27 | Disposition: A | Discharge status: Home / Self Care | Payer: Medicare Other | Source: Ambulatory Visit | Attending: Cardiothoracic Surgery | Admitting: Cardiothoracic Surgery

## 2019-02-27 DIAGNOSIS — Z952 Presence of prosthetic heart valve: Secondary | ICD-10-CM

## 2019-02-27 NOTE — Progress Notes (Signed)
Daily Session Note  Patient Details  Name: [name] MRN: [id number] Date of Birth: November 29, 1929 Referring Provider:     CARDIAC REHAB [place] from 01/13/2019 in [place]  Referring Provider  [place]      Encounter Date: 02/27/2019  Check In: Session Check In - 02/27/19 0815      Check-In   Supervising physician immediately available to respond to emergencies  See telemetry face sheet for immediately available ER MD    Location  [facility]    Staff Present  Benay Pike, Exercise Physiologist;Diane Coad, MS, EP, [facility], Exercise Physiologist    Medication changes reported      No    Fall or balance concerns reported     Yes    Comments  Patient has a h/o falls. 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    Warm-up and Cool-down  Performed as group-led Higher education careers adviser Performed  Yes    VAD Patient?  No    PAD/SET Patient?  No      Pain Assessment   Currently in Pain?  No/denies    Pain Score  0-No pain    Multiple Pain Sites  No       Capillary Blood Glucose: No results found for this or any previous visit (from the past 24 hour(s)).    Social History   Tobacco Use  Smoking Status Never Smoker  Smokeless Tobacco Never Used    Goals Met:  Proper associated with RPD/PD & O2 Sat Independence with exercise equipment Exercise tolerated well No report of cardiac concerns or symptoms Strength training completed today  Goals Unmet:  Not Applicable  Comments: Pt able to follow exercise prescription today without complaint.  Will continue to monitor for progression. Check out 0915.   Dr. Kate Sable is Medical Director for [facility] Cardiac and Pulmonary Rehab.

## 2019-03-01 ENCOUNTER — Encounter (HOSPITAL_COMMUNITY)
Admission: RE | Admit: 2019-03-01 | Discharge: 2019-03-01 | Disposition: A | Discharge status: Home / Self Care | Payer: Medicare Other | Source: Ambulatory Visit | Attending: Cardiothoracic Surgery | Admitting: Cardiothoracic Surgery

## 2019-03-01 ENCOUNTER — Other Ambulatory Visit: Payer: Self-pay

## 2019-03-01 DIAGNOSIS — Z952 Presence of prosthetic heart valve: Secondary | ICD-10-CM | POA: Diagnosis not present

## 2019-03-01 NOTE — Progress Notes (Signed)
Daily Session Note  Patient Details  Name: [name] MRN: [id number] Date of Birth: November 28, 1929 Referring Provider:     CARDIAC REHAB PHASE II ORIENTATION from 01/13/2019 in [place]  Referring Provider  [name]      Encounter Date: 03/01/2019  Check In: Session Check In - 03/01/19 0815      Check-In   Supervising physician immediately available to respond to emergencies  See telemetry face sheet for immediately available ER MD    Location  [facility]    Staff Present  Benay Pike, Exercise Physiologist;Debra Wynetta Emery, RN, BSN;Other    Medication changes reported      No    Fall or balance concerns reported     Yes    Comments  Patient has a h/o falls. 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    Warm-up and Cool-down  Performed as group-led Higher education careers adviser Performed  Yes    VAD Patient?  No    PAD/SET Patient?  No      Pain Assessment   Currently in Pain?  No/denies    Pain Score  0-No pain    Multiple Pain Sites  No       Capillary Blood Glucose: No results found for this or any previous visit (from the past 24 hour(s)).    Social History   Tobacco Use  Smoking Status Never Smoker  Smokeless Tobacco Never Used    Goals Met:  Proper associated with RPD/PD & O2 Sat Independence with exercise equipment Exercise tolerated well No report of cardiac concerns or symptoms Strength training completed today  Goals Unmet:  Not Applicable  Comments: Pt able to follow exercise prescription today without complaint.  Will continue to monitor for progression. Check out 0915.   Dr. Kate Sable is Medical Director for [facility] Cardiac and Pulmonary Rehab.

## 2019-03-03 ENCOUNTER — Encounter (HOSPITAL_COMMUNITY)
Admission: RE | Admit: 2019-03-03 | Discharge: 2019-03-03 | Disposition: A | Discharge status: Home / Self Care | Payer: Medicare Other | Source: Ambulatory Visit | Attending: Cardiothoracic Surgery | Admitting: Cardiothoracic Surgery

## 2019-03-03 ENCOUNTER — Other Ambulatory Visit: Payer: Self-pay

## 2019-03-03 DIAGNOSIS — Z952 Presence of prosthetic heart valve: Secondary | ICD-10-CM

## 2019-03-03 NOTE — Progress Notes (Signed)
Daily Session Note  Patient Details  Name: [name] MRN: [id number] Date of Birth: 10-Mar-1929 Referring Provider:     [place] from 01/13/2019 in [place]  Referring Provider  [name]      Encounter Date: 03/03/2019  Check In: Session Check In - 03/03/19 0815      Check-In   Supervising physician immediately available to respond to emergencies  See telemetry face sheet for immediately available ER MD    Location  [facility]    Staff Present  Benay Pike, Exercise Physiologist;Arda Daggs Wynetta Emery, RN, BSN;Diane Coad, MS, EP, [facility], Exercise Physiologist    Medication changes reported      No    Fall or balance concerns reported     Yes    Comments  Patient has a h/o falls. 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    Warm-up and Cool-down  Performed as group-led Higher education careers adviser Performed  Yes    VAD Patient?  No    PAD/SET Patient?  No      Pain Assessment   Currently in Pain?  No/denies    Pain Score  0-No pain    Multiple Pain Sites  No       Capillary Blood Glucose: No results found for this or any previous visit (from the past 24 hour(s)).    Social History   Tobacco Use  Smoking Status Never Smoker  Smokeless Tobacco Never Used    Goals Met:  Independence with exercise equipment Exercise tolerated well No report of cardiac concerns or symptoms Strength training completed today  Goals Unmet:  Not Applicable  Comments: Pt able to follow exercise prescription today without complaint.  Will continue to monitor for progression. Check out 915.   Dr. Kate Sable is Medical Director for [facility] Cardiac and Pulmonary Rehab.

## 2019-03-06 ENCOUNTER — Encounter (HOSPITAL_COMMUNITY)
Admission: RE | Admit: 2019-03-06 | Discharge: 2019-03-06 | Disposition: A | Discharge status: Home / Self Care | Payer: Medicare Other | Source: Ambulatory Visit | Attending: Cardiothoracic Surgery | Admitting: Cardiothoracic Surgery

## 2019-03-06 ENCOUNTER — Other Ambulatory Visit: Payer: Self-pay

## 2019-03-06 DIAGNOSIS — Z952 Presence of prosthetic heart valve: Secondary | ICD-10-CM

## 2019-03-06 NOTE — Progress Notes (Signed)
Daily Session Note  Patient Details  Name: GOR VESTAL MRN: [id number] Date of Birth: January 22, 1929 Referring Provider:     [place] from 01/13/2019 in [place]  Referring Provider  [name]      Encounter Date: 03/06/2019  Check In: Session Check In - 03/06/19 0815      Check-In   Supervising physician immediately available to respond to emergencies  See telemetry face sheet for immediately available ER MD    Location  [facility]    Staff Present  Benay Pike, Exercise Physiologist;Debra Wynetta Emery, RN, BSN;Diane Coad, MS, EP, [facility], Exercise Physiologist;Other    Medication changes reported      No    Fall or balance concerns reported     Yes    Comments  Patient has a h/o falls. 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    Warm-up and Cool-down  Performed as group-led Higher education careers adviser Performed  Yes    VAD Patient?  No    PAD/SET Patient?  No      Pain Assessment   Currently in Pain?  No/denies    Pain Score  0-No pain    Multiple Pain Sites  No       Capillary Blood Glucose: No results found for this or any previous visit (from the past 24 hour(s)).    Social History   Tobacco Use  Smoking Status Never Smoker  Smokeless Tobacco Never Used    Goals Met:  Proper associated with RPD/PD & O2 Sat Independence with exercise equipment Exercise tolerated well No report of cardiac concerns or symptoms Strength training completed today  Goals Unmet:  Not Applicable  Comments: Pt able to follow exercise prescription today without complaint.  Will continue to monitor for progression. Check out 0915.   Dr. Kate Sable is Medical Director for [facility] Cardiac and Pulmonary Rehab.

## 2019-03-08 ENCOUNTER — Encounter (HOSPITAL_COMMUNITY): Payer: Medicare Other

## 2019-03-10 ENCOUNTER — Encounter (HOSPITAL_COMMUNITY): Payer: Medicare Other

## 2019-03-10 NOTE — Progress Notes (Addendum)
Cardiac Individual Treatment Plan  Patient Details  Name: [name] MRN: [id number] Date of Birth: [birth year] Referring Provider:     CARDIAC REHAB [place] from 01/13/2019 in [place]  Referring Provider  [place]      Initial Encounter Date:    CARDIAC REHAB PHASE II ORIENTATION from 01/13/2019 in [place]  Date  01/13/19      Visit Diagnosis: S/P TAVR (transcatheter aortic valve replacement)  Patient's Home Medications on Admission:  Current Outpatient Medications:  .  aspirin 81 MG tablet, Take 81 mg by mouth every evening. , Disp: , Rfl:  .  clopidogrel (PLAVIX) 75 MG tablet, Take 75 mg by mouth daily., Disp: , Rfl:  .  ibuprofen (ADVIL,MOTRIN) 200 MG tablet, Take 400 mg by mouth every 6 (six) hours as needed for moderate pain., Disp: , Rfl:  .  latanoprost (XALATAN) 0.005 % ophthalmic solution, Place 1 drop into both eyes at bedtime., Disp: , Rfl:  .  metFORMIN (GLUCOPHAGE) 500 MG tablet, Take 500 mg by mouth 2 (two) times daily with a meal., Disp: , Rfl:  .  pyridOXINE (VITAMIN B-6) 100 MG tablet, Take 100 mg by mouth daily., Disp: , Rfl:  .  ramipril (ALTACE) 10 MG tablet, Take 10 mg by mouth daily., Disp: , Rfl:  .  simvastatin (ZOCOR) 20 MG tablet, Take 20 mg by mouth at bedtime., Disp: , Rfl:  .  vitamin B-12 (CYANOCOBALAMIN) 1000 MCG tablet, Take 1,000 mcg by mouth daily., Disp: , Rfl:   Past Medical History: Past Medical History:  Diagnosis Date  . Aortic stenosis   . Essential hypertension   . Glaucoma   . Guillain-Barre syndrome ([place]) 06/2013   Treated at [facility]  . History of nephrolithiasis   . History of pneumonia   . Hyperlipidemia   . Patent ductus arteriosus    Surgically repaired at age 67  . Prostate cancer ([place])   . Type 2 diabetes mellitus (H[facility])     Tobacco Use: Social History   Tobacco Use  Smoking Status Never Smoker  Smokeless Tobacco Never Used    Labs: Recent Review  Flowsheet Data    There is no flowsheet data to display.      Capillary Blood Glucose: Lab Results  Component Value Date   GLUCAP 134 (H) 11/03/2016   GLUCAP 110 (H) 10/13/2016   GLUCAP 134 (H) 06/12/2014   GLUCAP 135 (H) 09/11/2013   GLUCAP 124 (H) 09/08/2013     Exercise Target Goals: Exercise Program Goal: Individual exercise prescription set using results from initial 6 min walk test and THRR while considering  patient's activity barriers and safety.   Exercise Prescription Goal: Starting with aerobic activity 30 plus minutes a day, 3 days per week for initial exercise prescription. Provide home exercise prescription and guidelines that participant acknowledges understanding prior to discharge.  Activity Barriers & Risk Stratification: Activity Barriers & Cardiac Risk Stratification - 01/13/19 1035      Activity Barriers & Cardiac Risk Stratification   Activity Barriers  Balance Concerns;Muscular Weakness;Joint Problems;Other (comment)    Comments  knee pain     Cardiac Risk Stratification  High       6 Minute Walk: 6 Minute Walk    Row Name 01/13/19 1034         6 Minute Walk   Phase  Initial     Distance  770 feet     Walk Time  6 minutes     #  of Rest Breaks  0     MPH  1.46     METS  2.11     RPE  9     Perceived Dyspnea   8     VO2 Peak  3.78     Symptoms  No     Resting HR  74 bpm     Resting BP  150/66     Resting Oxygen Saturation   94 %     Exercise Oxygen Saturation  during 6 min walk  96 %     Max Ex. HR  97 bpm     Max Ex. BP  146/62     2 Minute Post BP  122/64        Oxygen Initial Assessment:   Oxygen Re-Evaluation:   Oxygen Discharge (Final Oxygen Re-Evaluation):   Initial Exercise Prescription: Initial Exercise Prescription - 01/13/19 1000      Date of Initial Exercise RX and Referring Provider   Date  01/13/19    Referring Provider  [name]    Expected Discharge Date  04/14/19      NuStep   Level  1    SPM  58     Minutes  17    METs  1.8      Arm Ergometer   Level  1    Watts  11    RPM  51    Minutes  17    METs  1.8      Prescription Details   Frequency (times per week)  3    Duration  Progress to 30 minutes of continuous aerobic without signs/symptoms of physical distress      Intensity   THRR 40-80% of Max Heartrate  [phone]    Ratings of Perceived Exertion  11-13    Perceived Dyspnea  0-4      Progression   Progression  Continue to progress workloads to maintain intensity without signs/symptoms of physical distress.      Resistance Training   Training Prescription  Yes    Weight  1    Reps  10-15       Perform Capillary Blood Glucose checks as needed.  Exercise Prescription Changes:  Exercise Prescription Changes    Row Name 01/17/19 1000 02/03/19 1400 02/14/19 0800 02/28/19 0700 03/13/19 0900     Response to Exercise   Blood Pressure (Admit)  132/64  140/68  138/60  164/50  120/58   Blood Pressure (Exercise)  180/62  180/70  168/62  180/70  190/70   Blood Pressure (Exit)  144/72  160/50  128/52  150/74  148/60   Heart Rate (Admit)  83 bpm  98 bpm  78 bpm  77 bpm  80 bpm   Heart Rate (Exercise)  115 bpm  113 bpm  119 bpm  113 bpm  120 bpm   Heart Rate (Exit)  92 bpm  92 bpm  87 bpm  86 bpm  88 bpm   Rating of Perceived Exertion (Exercise)  _0 Comments  First day of exercise!   first two weeks of exercise  -  increase in overall MET level   increase in overall MET level    Duration  Continue with 30 min of aerobic exercise without signs/symptoms of physical distress.  Continue with 30 min of aerobic exercise without signs/symptoms of physical distress.  Continue with 30 min of aerobic exercise without signs/symptoms of physical  distress.  Continue with 30 min of aerobic exercise without signs/symptoms of physical distress.  Continue with 30 min of aerobic exercise without signs/symptoms of physical distress.   Intensity  THRR New 97-109-121  THRR  unchanged  THRR unchanged  THRR unchanged  THRR unchanged     Progression   Progression  Continue to progress workloads to maintain intensity without signs/symptoms of physical distress.  Continue to progress workloads to maintain intensity without signs/symptoms of physical distress.  Continue to progress workloads to maintain intensity without signs/symptoms of physical distress.  Continue to progress workloads to maintain intensity without signs/symptoms of physical distress.  Continue to progress workloads to maintain intensity without signs/symptoms of physical distress.   Average METs  2.05  2.5  2.55  2.95  3.2     Resistance Training   Training Prescription  Yes  Yes  Yes  Yes  Yes   Weight  _0 Reps  10-15  10-15  10-15  10-15  10-15     NuStep   Level  _1 SPM  81  102  98  104  101   Minutes  _2 METs  2.2  3  2.4  2.8  3.2     Arm Ergometer   Level  _3 Watts  _4 RPM  54  62  59  56  59   Minutes  _5 METs  1.9  2  2.7  3.1  3.2     Home Exercise Plan   Plans to continue exercise at  Home (comment)  Home (comment)  Home (comment)  Home (comment)  Home (comment)   Frequency  Add 2 additional days to program exercise sessions.  Add 2 additional days to program exercise sessions.  Add 2 additional days to program exercise sessions.  Add 2 additional days to program exercise sessions.  Add 2 additional days to program exercise sessions.   Initial Home Exercises Provided  01/13/19  01/13/19  01/13/19  01/13/19  01/13/19      Exercise Comments:  Exercise Comments    Row Name 01/24/19 0801 02/21/19 0743 03/13/19 0946       Exercise Comments  Pt. is new to CR. He has attended 5 sessions so far. He has tolerated the NuStep and Arm Ergometer well. We will continue to monitor and progress as he increases strength.   Pt. has attended 17 sessions in the  program. He is consistant which has  helped him to get in a routine as well as see progress in his strength. He states he feels stronger since beginning the program. He would still like to improve his balance and mobility the best he can.   Pt. is doing great in CR. He is eager to get here everyday to work hard. He wantes to stay active outside of here and states he is feeling more comfortable to do so.         Exercise Goals and Review:  Exercise Goals    Row Name 01/13/19 1038             Exercise Goals   Increase Physical Activity  Yes  Intervention  Provide advice, education, support and counseling about physical activity/exercise needs.;Develop an individualized exercise prescription for aerobic and resistive training based on initial evaluation findings, risk stratification, comorbidities and participant's personal goals.       Expected Outcomes  Short Term: Attend rehab on a regular basis to increase amount of physical activity.       Increase Strength and Stamina  Yes       Intervention  Provide advice, education, support and counseling about physical activity/exercise needs.       Expected Outcomes  Short Term: Increase workloads from initial exercise prescription for resistance, speed, and METs.       Able to understand and use rate of perceived exertion (RPE) scale  Yes       Intervention  Provide education and explanation on how to use RPE scale       Expected Outcomes  Short Term: Able to use RPE daily in rehab to express subjective intensity level;Long Term:  Able to use RPE to guide intensity level when exercising independently       Able to understand and use Dyspnea scale  Yes       Intervention  Provide education and explanation on how to use Dyspnea scale       Expected Outcomes  Short Term: Able to use Dyspnea scale daily in rehab to express subjective sense of shortness of breath during exertion;Long Term: Able to use Dyspnea scale to guide intensity level when exercising independently       Knowledge  and understanding of Target Heart Rate Range (THRR)  Yes       Intervention  Provide education and explanation of THRR including how the numbers were predicted and where they are located for reference       Expected Outcomes  Short Term: Able to state/look up THRR       Able to check pulse independently  Yes       Intervention  Provide education and demonstration on how to check pulse in carotid and radial arteries.;Review the importance of being able to check your own pulse for safety during independent exercise       Expected Outcomes  Short Term: Able to explain why pulse checking is important during independent exercise;Long Term: Able to check pulse independently and accurately       Understanding of Exercise Prescription  Yes       Intervention  Provide education, explanation, and written materials on patient's individual exercise prescription       Expected Outcomes  Short Term: Able to explain program exercise prescription;Long Term: Able to explain home exercise prescription to exercise independently          Exercise Goals Re-Evaluation : Exercise Goals Re-Evaluation    Row Name 01/24/19 0759 02/21/19 0741 03/13/19 0943         Exercise Goal Re-Evaluation   Exercise Goals Review  Increase Physical Activity;Increase Strength and Stamina;Able to understand and use rate of perceived exertion (RPE) scale;Knowledge and understanding of Target Heart Rate Range (THRR);Able to check pulse independently;Understanding of Exercise Prescription  Increase Physical Activity;Increase Strength and Stamina;Able to understand and use rate of perceived exertion (RPE) scale;Knowledge and understanding of Target Heart Rate Range (THRR);Able to check pulse independently;Understanding of Exercise Prescription  Increase Physical Activity;Increase Strength and Stamina;Able to understand and use rate of perceived exertion (RPE) scale;Knowledge and understanding of Target Heart Rate Range (THRR);Able to check  pulse independently;Understanding of Exercise Prescription     Comments  Pt. is still fairly new to the program. He has attended 5 sessions so far including his orientation. He has been able to tolerate the exercise well. We will monitor and progress as needed.  Pt. is doing great in the program. He is tolerating all the exercise well and has shown his ability to work hard to increase his overall MET level every session.  Pt. continues to do well in the program. He has attended 26 sessions so far and continues to push himself everyday.      Expected Outcomes  improve mobility to stay active.   improve balance and mobility to stay active.   improve balance and mobility to stay active.          Discharge Exercise Prescription (Final Exercise Prescription Changes): Exercise Prescription Changes - 03/13/19 0900      Response to Exercise   Blood Pressure (Admit)  120/58    Blood Pressure (Exercise)  190/70    Blood Pressure (Exit)  148/60    Heart Rate (Admit)  80 bpm    Heart Rate (Exercise)  120 bpm    Heart Rate (Exit)  88 bpm    Rating of Perceived Exertion (Exercise)  11    Comments  increase in overall MET level     Duration  Continue with 30 min of aerobic exercise without signs/symptoms of physical distress.    Intensity  THRR unchanged      Progression   Progression  Continue to progress workloads to maintain intensity without signs/symptoms of physical distress.    Average METs  3.2      Resistance Training   Training Prescription  Yes    Weight  2    Reps  10-15      NuStep   Level  3    SPM  101    Minutes  17    METs  3.2      Arm Ergometer   Level  3    Watts  30    RPM  59    Minutes  22    METs  3.2      Home Exercise Plan   Plans to continue exercise at  Home (comment)    Frequency  Add 2 additional days to program exercise sessions.    Initial Home Exercises Provided  01/13/19       Nutrition:  Target Goals: Understanding of nutrition guidelines, daily  intake of sodium <1568m, cholesterol <2058m calories 30% from fat and 7% or less from saturated fats, daily to have 5 or more servings of fruits and vegetables.  Biometrics: Pre Biometrics - 01/13/19 1038      Pre Biometrics   Height  _0  (1.753 m)    Weight  88.7 kg    Waist Circumference  43 inches    Hip Circumference  41.5 inches    Waist to Hip Ratio  1.04 %    BMI (Calculated)  28.87    Triceps Skinfold  10 mm    % Body Fat  23.3 %    Grip Strength  8 kg        Nutrition Therapy Plan and Nutrition Goals: Nutrition Therapy & Goals - 03/13/19 1058      Nutrition Therapy   RD appointment deferred  Yes      Personal Nutrition Goals   Comments  Patient was encouraged to meet with RD.  He continues to say he is eating fiber ceral instead of ceral with  a lot of sugar. Will continue to monitor for progress.       Intervention Plan   Intervention  Nutrition handout(s) given to patient.       Nutrition Assessments: Nutrition Assessments - 01/13/19 1011      MEDFICTS Scores   Pre Score  72       Nutrition Goals Re-Evaluation:   Nutrition Goals Discharge (Final Nutrition Goals Re-Evaluation):   Psychosocial: Target Goals: Acknowledge presence or absence of significant depression and/or stress, maximize coping skills, provide positive support system. Participant is able to verbalize types and ability to use techniques and skills needed for reducing stress and depression.  Initial Review & Psychosocial Screening: Initial Psych Review & Screening - 01/13/19 1022      Initial Review   Current issues with  None Identified      Family Dynamics   Good Support System?  Yes      Barriers   Psychosocial barriers to participate in program  There are no identifiable barriers or psychosocial needs.      Screening Interventions   Interventions  Encouraged to exercise       Quality of Life Scores: Quality of Life - 01/13/19 1039      Quality of Life   Select   Quality of Life      Quality of Life Scores   Health/Function Pre  18.47 %    Socioeconomic Pre  24.83 %    Psych/Spiritual Pre  25.29 %    Family Pre  19.5 %    GLOBAL Pre  21.28 %      Scores of 19 and below usually indicate a poorer quality of life in these areas.  A difference of  2-3 points is a clinically meaningful difference.  A difference of 2-3 points in the total score of the Quality of Life Index has been associated with significant improvement in overall quality of life, self-image, physical symptoms, and general health in studies assessing change in quality of life.  PHQ-9: Recent Review Flowsheet Data    Depression screen [facility] 2/9 01/13/2019   Decreased Interest 0   Down, Depressed, Hopeless 0   PHQ - 2 Score 0   Altered sleeping 0   Tired, decreased energy 2   Change in appetite 0   Feeling bad or failure about yourself  0   Trouble concentrating 0   Moving slowly or fidgety/restless 0   Suicidal thoughts 0   PHQ-9 Score 2   Difficult doing work/chores Not difficult at all     Interpretation of Total Score  Total Score Depression Severity:  1-4 = Minimal depression, 5-9 = Mild depression, 10-14 = Moderate depression, 15-19 = Moderately severe depression, 20-27 = Severe depression   Psychosocial Evaluation and Intervention: Psychosocial Evaluation - 01/13/19 1041      Psychosocial Evaluation & Interventions   Interventions  Encouraged to exercise with the program and follow exercise prescription;Stress management education;Relaxation education    Comments  Patient has no psychosocial issues identified at orientation. His initial QOL score was 21.28 and his PHQ-9 score was 2.     Expected Outcomes  Patient will have no psychosocial issues identified at discharge.     Continue Psychosocial Services   No Follow up required       Psychosocial Re-Evaluation: Psychosocial Re-Evaluation    [place] Name 01/26/19 7858 02/22/19 1454 03/13/19 1104         Psychosocial  Re-Evaluation   Current issues with  None Identified  None Identified  None Identified     Comments  Patient's initial QOL score was 21.28 and his PHQ_9 score was 1 with no pyschosocial issues identified. Will continue to monitor for progress.   Patient's initial QOL score was 21.28 and his PHQ_9 score was 1 with no pyschosocial issues identified. Will continue to monitor for progress.   Patient's initial QOL score was 21.28 and his PHQ_9 score was 1 with no pyschosocial issues identified. Will continue to monitor for progress.      Expected Outcomes  Patient will have no psychosocial issues identified at discharge.   Patient will have no psychosocial issues identified at discharge.   Patient will have no psychosocial issues identified at discharge.      Interventions  Stress management education;Relaxation education;Encouraged to attend Cardiac Rehabilitation for the exercise  Stress management education;Relaxation education;Encouraged to attend Cardiac Rehabilitation for the exercise  Stress management education;Relaxation education;Encouraged to attend Cardiac Rehabilitation for the exercise     Continue Psychosocial Services   No Follow up required  No Follow up required  No Follow up required        Psychosocial Discharge (Final Psychosocial Re-Evaluation): Psychosocial Re-Evaluation - 03/13/19 1104      Psychosocial Re-Evaluation   Current issues with  None Identified    Comments  Patient's initial QOL score was 21.28 and his PHQ_9 score was 1 with no pyschosocial issues identified. Will continue to monitor for progress.     Expected Outcomes  Patient will have no psychosocial issues identified at discharge.     Interventions  Stress management education;Relaxation education;Encouraged to attend Cardiac Rehabilitation for the exercise    Continue Psychosocial Services   No Follow up required       Vocational Rehabilitation: Provide vocational rehab assistance to qualifying candidates.    Vocational Rehab Evaluation & Intervention: Vocational Rehab - 01/13/19 1018      Initial Vocational Rehab Evaluation & Intervention   Assessment shows need for Vocational Rehabilitation  No      Vocational Rehab Re-Evaulation   Comments  Patient is retired and not interested in returning to employment.        Education: Education Goals: Education classes will be provided on a weekly basis, covering required topics. Participant will state understanding/return demonstration of topics presented.  Learning Barriers/Preferences: Learning Barriers/Preferences - 01/13/19 1025      Learning Barriers/Preferences   Learning Barriers  None    Learning Preferences  Skilled Demonstration       Education Topics: Hypertension, Hypertension Reduction -Define heart disease and high blood pressure. Discus how high blood pressure affects the body and ways to reduce high blood pressure.   CARDIAC REHAB PHASE II EXERCISE from 03/01/2019 in [place]  Date  01/25/19  Educator  DJ  Instruction Review Code  2- Demonstrated Understanding      Exercise and Your Heart -Discuss why it is important to exercise, the FITT principles of exercise, normal and abnormal responses to exercise, and how to exercise safely.   CARDIAC REHAB PHASE II EXERCISE from 03/01/2019 in [place]  Date  02/01/19  Educator  Wynetta Emery  Instruction Review Code  2- Demonstrated Understanding      Angina -Discuss definition of angina, causes of angina, treatment of angina, and how to decrease risk of having angina.   CARDIAC REHAB PHASE II EXERCISE from 03/01/2019 in [place]  Date  02/08/19  Educator  Wynetta Emery  Instruction Review Code  2-  Demonstrated Understanding      Cardiac Medications -Review what the following cardiac medications are used for, how they affect the body, and side effects that may occur when taking the medications.  Medications  include Aspirin, Beta blockers, calcium channel blockers, ACE Inhibitors, angiotensin receptor blockers, diuretics, digoxin, and antihyperlipidemics.   CARDIAC REHAB PHASE II EXERCISE from 03/01/2019 in [place]  Date  02/15/19  Educator  Wynetta Emery  Instruction Review Code  2- Demonstrated Understanding      Congestive Heart Failure -Discuss the definition of CHF, how to live with CHF, the signs and symptoms of CHF, and how keep track of weight and sodium intake.   CARDIAC REHAB PHASE II EXERCISE from 03/01/2019 in [place]  Date  02/22/19  Educator  Wynetta Emery  Instruction Review Code  2- Demonstrated Understanding      Heart Disease and Intimacy -Discus the effect sexual activity has on the heart, how changes occur during intimacy as we age, and safety during sexual activity.   CARDIAC REHAB PHASE II EXERCISE from 03/01/2019 in [place]  Date  03/01/19  Educator  Wynetta Emery  Instruction Review Code  2- Demonstrated Understanding      Smoking Cessation / COPD -Discuss different methods to quit smoking, the health benefits of quitting smoking, and the definition of COPD.   Nutrition I: Fats -Discuss the types of cholesterol, what cholesterol does to the heart, and how cholesterol levels can be controlled.   Nutrition II: Labels -Discuss the different components of food labels and how to read food label   Heart Parts/Heart Disease and PAD -Discuss the anatomy of the heart, the pathway of blood circulation through the heart, and these are affected by heart disease.   Stress I: Signs and Symptoms -Discuss the causes of stress, how stress may lead to anxiety and depression, and ways to limit stress.   Stress II: Relaxation -Discuss different types of relaxation techniques to limit stress.   Warning Signs of Stroke / TIA -Discuss definition of a stroke, what the signs and symptoms are of a stroke, and how to  identify when someone is having stroke.   CARDIAC REHAB PHASE II EXERCISE from 03/01/2019 in [place]  Date  01/18/19  Educator  DJ  Instruction Review Code  2- Demonstrated Understanding      Knowledge Questionnaire Score: Knowledge Questionnaire Score - 01/13/19 1011      Knowledge Questionnaire Score   Pre Score  19/24       Core Components/Risk Factors/Patient Goals at Admission: Personal Goals and Risk Factors at Admission - 01/13/19 1020      Core Components/Risk Factors/Patient Goals on Admission    Weight Management  Weight Maintenance    Diabetes  Yes    Intervention  Provide education about signs/symptoms and action to take for hypo/hyperglycemia.;Provide education about proper nutrition, including hydration, and aerobic/resistive exercise prescription along with prescribed medications to achieve blood glucose in normal ranges: Fasting glucose 65-99 mg/dL    Expected Outcomes  Long Term: Attainment of HbA1C < 7%.;Short Term: Participant verbalizes understanding of the signs/symptoms and immediate care of hyper/hypoglycemia, proper foot care and importance of medication, aerobic/resistive exercise and nutrition plan for blood glucose control.    Hypertension  Yes    Intervention  Provide education on lifestyle modifcations including regular physical activity/exercise, weight management, moderate sodium restriction and increased consumption of fresh fruit, vegetables, and low fat dairy, alcohol moderation, and smoking cessation.  Expected Outcomes  Short Term: Continued assessment and intervention until BP is < 140/32m HG in hypertensive participants. < 130/866mHG in hypertensive participants with diabetes, heart failure or chronic kidney disease.    Personal Goal Other  Yes    Personal Goal  Improve mobility; stay active.     Intervention  Patient will attend CR 3 days/week and will supplement with exercise 2 days/week.    Expected Outcomes  Patient  will meet his personal goals.        Core Components/Risk Factors/Patient Goals Review:  Goals and Risk Factor Review    Row Name 01/26/19 0806 02/22/19 1448 03/13/19 1059         Core Components/Risk Factors/Patient Goals Review   Personal Goals Review  Hypertension;Diabetes;Weight Management/Obesity Improve mobility; stay active.   Hypertension;Diabetes;Weight Management/Obesity Improve mobility; stay active.   Hypertension;Diabetes;Weight Management/Obesity Improve mobility; stay active.      Review  Patient has completed 6 sessions maintaining his weight since last 30 day review. He is doing well in the program with some progression. He is new to the program. Will continue to monitor for progress.   Patient has completed 18 session gaining 1 lb since last 30 day review. He is doing well in the program with progression. 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 His last A1C was in February 2020 at 5.78m58ml. His reported fasting glucose readings average 130 mg/dl. Will continue to monitor for progress.   Patient has completed 23 sessions maintaining his weight since last 30 day review. He continues to do well in the program with progression. He continues to say he feels stronger and has more energy. He has had no A1C's since last 30 day review. His reported fasting glucose readings are averaging 130 mg/dl. His pcp added his Lisonopril 10 mg daily on 02/15/19 with no improvement in his blood pressure. Staff reached out to cardiology to make him an appointment for tighter blood pressue control. Outpatient cardiac rehab services have been suspended due to the COVID-19 restrictions. Will continue to monitor.      Expected Outcomes  Patient will continue to attend sessions and complete the program meeting his personal goals.   Patient will continue to attend sessions and complete the program meeting his personal  goals.   Patient will continue to attend sessions and complete the program meeting his personal goals.         Core Components/Risk Factors/Patient Goals at Discharge (Final Review):  Goals and Risk Factor Review - 03/13/19 1059      Core Components/Risk Factors/Patient Goals Review   Personal Goals Review  Hypertension;Diabetes;Weight Management/Obesity   Improve mobility; stay active.    Review  Patient has completed 23 sessions maintaining his weight since last 30 day review. He continues to do well in the program with progression. He continues to say he feels stronger and has more energy. He has had no A1C's since last 30 day review. His reported fasting glucose readings are averaging 130 mg/dl. His pcp added his Lisonopril 10 mg daily on 02/15/19 with no improvement in his blood pressure. Staff reached out to cardiology to make him an appointment for tighter blood pressue control. Outpatient cardiac rehab services have been suspended due to the COVID-19 restrictions. Will continue to monitor.     Expected Outcomes  Patient will continue to attend sessions and complete the program meeting his personal goals.  ITP Comments: ITP Comments    Row Name 01/13/19 1035 03/10/19 1308         ITP Comments  Patient is an 85 year old male referred to CR for TVAR. He has a h/o guillain barre causing an unsteady gait and a h/o falls.   Cardiac Rehab services have been suspended due to the COVID-19 restrictions starting 03/06/2019. Patient will restart the progam when restrictions have been lifted.         Comments: ITP REVIEW Patient is doing well in the program. Cardiac Rehab services have been suspended due to the COVID-19 restrictions starting 03/06/2019. Patient will restart the progam when restrictions have been lifted. Will continue to monitor.

## 2019-03-13 ENCOUNTER — Encounter (HOSPITAL_COMMUNITY): Payer: Medicare Other

## 2019-03-15 ENCOUNTER — Telehealth: Payer: Self-pay | Admitting: Student

## 2019-03-15 ENCOUNTER — Other Ambulatory Visit: Payer: Self-pay | Admitting: Student

## 2019-03-15 ENCOUNTER — Encounter (HOSPITAL_COMMUNITY): Payer: Medicare Other

## 2019-03-15 MED ORDER — LISINOPRIL 10 MG PO TABS: 10.0000 mg | ORAL_TABLET | Freq: Every day | ORAL | 0 refills | Status: DC

## 2019-03-15 NOTE — Telephone Encounter (Signed)
   Cardiac Questionnaire:    Since your last visit or hospitalization:    1. Have you been having new or worsening chest pain? No   2. Have you been having new or worsening shortness of breath? No 3. Have you been having new or worsening leg swelling, wt gain, or increase in abdominal girth (pants fitting more tightly)? No   4. Have you had any passing out spells? No    *A YES to any of these questions would result in the appointment being kept. *If all the answers to these questions are NO, we should indicate that given the current situation regarding the worldwide coronarvirus pandemic, at the recommendation of the CDC, we are looking to limit gatherings in our waiting area, and thus will reschedule their appointment beyond four weeks from today.   _____________   Patient reports overall doing well from a cardiac perspective. No recent symptoms. BP was previously elevated two weeks ago but says he had consumed hot dogs on a daily basis around that time and since returning to his usual diet, BP has improved. He was switched from Ramipril to Lisinopril by his PCP several months ago and this was updated in the chart. Given improved readings, I encouraged him to continue to follow this at home. If readings again become elevated, could titrate Lisinopril from 10mg  daily to 20mg  daily but he would need a repeat BMET in 2 weeks of doing so.   Can cancel visit for 3/27 and reschedule 2-3 months from now.   Signed, Erma Heritage, PA-C 03/15/2019, 11:35 AM Pager: [phone]

## 2019-03-17 ENCOUNTER — Ambulatory Visit: Payer: Medicare Other | Admitting: Student

## 2019-03-17 ENCOUNTER — Encounter (HOSPITAL_COMMUNITY): Payer: Medicare Other

## 2019-03-20 ENCOUNTER — Encounter (HOSPITAL_COMMUNITY): Payer: Medicare Other

## 2019-03-22 ENCOUNTER — Encounter (HOSPITAL_COMMUNITY): Payer: Medicare Other

## 2019-03-24 ENCOUNTER — Encounter (HOSPITAL_COMMUNITY): Payer: Medicare Other

## 2019-03-27 ENCOUNTER — Encounter (HOSPITAL_COMMUNITY): Payer: Medicare Other

## 2019-03-29 ENCOUNTER — Encounter (HOSPITAL_COMMUNITY): Payer: Medicare Other

## 2019-03-30 ENCOUNTER — Telehealth (HOSPITAL_COMMUNITY): Payer: Self-pay

## 2019-03-30 NOTE — Telephone Encounter (Signed)
Called patient to check on his exercise progress during out absence in Cardiac Rehab due to COVID-19 closure. Spoke with his step-daughter. She says he is doing well and is trying to walk some every. Told her to encourage him to keep up the exercise and we will continue to follow up with him.

## 2019-03-31 ENCOUNTER — Encounter (HOSPITAL_COMMUNITY): Payer: Medicare Other

## 2019-04-03 ENCOUNTER — Encounter (HOSPITAL_COMMUNITY): Payer: Medicare Other

## 2019-04-05 ENCOUNTER — Encounter (HOSPITAL_COMMUNITY): Payer: Medicare Other

## 2019-04-07 ENCOUNTER — Encounter (HOSPITAL_COMMUNITY): Payer: Medicare Other

## 2019-04-11 ENCOUNTER — Telehealth (HOSPITAL_COMMUNITY): Payer: Self-pay

## 2019-04-11 NOTE — Telephone Encounter (Signed)
Called patient to check if he would be interested in participating in virtual CR until we reopen. He said he is not interested but is looking forward to returning to the program when we reopen. Will follow up soon.

## 2019-04-24 ENCOUNTER — Telehealth (HOSPITAL_COMMUNITY): Payer: Self-pay

## 2019-04-24 NOTE — Telephone Encounter (Signed)
Called patient to check on him. He says he is doing well and is trying to stay active by working in his garden. He says he walking is more unsteady now than when he left the program because he is just not walking as much. He is looking forward to returning. Informed him we would follow up with him soon.

## 2019-05-19 DIAGNOSIS — D649 Anemia, unspecified: Secondary | ICD-10-CM | POA: Diagnosis not present

## 2019-05-19 DIAGNOSIS — I2510 Atherosclerotic heart disease of native coronary artery without angina pectoris: Secondary | ICD-10-CM | POA: Diagnosis not present

## 2019-05-19 DIAGNOSIS — E1140 Type 2 diabetes mellitus with diabetic neuropathy, unspecified: Secondary | ICD-10-CM | POA: Diagnosis not present

## 2019-05-19 DIAGNOSIS — Z79899 Other long term (current) drug therapy: Secondary | ICD-10-CM | POA: Diagnosis not present

## 2019-05-19 DIAGNOSIS — I10 Essential (primary) hypertension: Secondary | ICD-10-CM | POA: Diagnosis not present

## 2019-05-26 ENCOUNTER — Telehealth (HOSPITAL_COMMUNITY): Payer: Self-pay

## 2019-05-26 DIAGNOSIS — D509 Iron deficiency anemia, unspecified: Secondary | ICD-10-CM | POA: Diagnosis not present

## 2019-05-26 DIAGNOSIS — I10 Essential (primary) hypertension: Secondary | ICD-10-CM | POA: Diagnosis not present

## 2019-05-26 DIAGNOSIS — E1140 Type 2 diabetes mellitus with diabetic neuropathy, unspecified: Secondary | ICD-10-CM | POA: Diagnosis not present

## 2019-05-26 NOTE — Telephone Encounter (Signed)
Called patient to check on him and to see if he will be returning to Cardiac Rehab when we reopen. He has 13 sessions left to complete the program and he says he will return. He says he is doing well and is looking forward to coming back. I informed him we do not have a reopen date but would call him when we do. He verbalized understanding.

## 2019-06-13 ENCOUNTER — Telehealth (HOSPITAL_COMMUNITY): Payer: Self-pay | Admitting: Physical Therapy

## 2019-06-13 NOTE — Telephone Encounter (Signed)
Left message with pt to let him know we would be reopening Cardiac Rehab on Monday June 29. Asked him to please call us back by Friday to confirm that he would be returning and to give [place] time to explain to him our new procedures.

## 2019-06-15 DIAGNOSIS — H401132 Primary open-angle glaucoma, bilateral, moderate stage: Secondary | ICD-10-CM | POA: Diagnosis not present

## 2019-06-15 DIAGNOSIS — H26491 Other secondary cataract, right eye: Secondary | ICD-10-CM | POA: Diagnosis not present

## 2019-06-19 ENCOUNTER — Telehealth: Payer: Self-pay | Admitting: Cardiology

## 2019-06-19 ENCOUNTER — Encounter (HOSPITAL_COMMUNITY)
Admission: RE | Admit: 2019-06-19 | Payer: Medicare Other | Source: Ambulatory Visit | Attending: Cardiothoracic Surgery | Admitting: Cardiothoracic Surgery

## 2019-06-19 ENCOUNTER — Other Ambulatory Visit: Payer: Self-pay

## 2019-06-19 ENCOUNTER — Encounter (HOSPITAL_COMMUNITY)
Admission: RE | Admit: 2019-06-19 | Discharge: 2019-06-19 | Disposition: A | Discharge status: Home / Self Care | Payer: Medicare Other | Source: Ambulatory Visit | Attending: Cardiothoracic Surgery | Admitting: Cardiothoracic Surgery

## 2019-06-19 NOTE — Telephone Encounter (Signed)
Pt is keeping diary of BP for visit.I will message pcp office for labs

## 2019-06-19 NOTE — Telephone Encounter (Signed)
Patient called stating that BP was elevated this AM and they would not let him do Cardiac Rehab. He has scheduled appointment w/ SM on 7/6. Wants to know if there needs to be any med changes prior to appt due to BP being elevated this AM. / tg

## 2019-06-19 NOTE — Telephone Encounter (Signed)
Patient repeated BP at home 147/66, 137/61

## 2019-06-19 NOTE — Telephone Encounter (Signed)
Let's just have him track blood pressure and write it down once a day for now.  We can go over the results when I see him and make adjustments from there.  Also, if he has had any recent lab work with Dr. Willey Blade that would be useful for review.

## 2019-06-21 ENCOUNTER — Encounter (HOSPITAL_COMMUNITY): Payer: Medicare Other

## 2019-06-23 ENCOUNTER — Encounter (HOSPITAL_COMMUNITY): Payer: Medicare Other

## 2019-06-26 ENCOUNTER — Encounter (HOSPITAL_COMMUNITY): Payer: Medicare Other

## 2019-06-26 ENCOUNTER — Encounter: Payer: Self-pay | Admitting: Cardiology

## 2019-06-26 ENCOUNTER — Ambulatory Visit (INDEPENDENT_AMBULATORY_CARE_PROVIDER_SITE_OTHER): Payer: Medicare Other | Admitting: Cardiology

## 2019-06-26 ENCOUNTER — Other Ambulatory Visit: Payer: Self-pay

## 2019-06-26 VITALS — BP 148/68 | HR 84 | Temp 99.6°F | Ht 68.5 in | Wt 191.0 lb

## 2019-06-26 DIAGNOSIS — E782 Mixed hyperlipidemia: Secondary | ICD-10-CM | POA: Diagnosis not present

## 2019-06-26 DIAGNOSIS — Z953 Presence of xenogenic heart valve: Secondary | ICD-10-CM | POA: Diagnosis not present

## 2019-06-26 DIAGNOSIS — I25119 Atherosclerotic heart disease of native coronary artery with unspecified angina pectoris: Secondary | ICD-10-CM | POA: Diagnosis not present

## 2019-06-26 DIAGNOSIS — I350 Nonrheumatic aortic (valve) stenosis: Secondary | ICD-10-CM

## 2019-06-26 DIAGNOSIS — I10 Essential (primary) hypertension: Secondary | ICD-10-CM

## 2019-06-26 MED ORDER — LISINOPRIL 20 MG PO TABS: 20.0000 mg | ORAL_TABLET | Freq: Every day | ORAL | 3 refills | Status: DC

## 2019-06-26 NOTE — Patient Instructions (Addendum)
Medication Instructions: INCREASE Lisinopril to 20 mg daily  Labwork: BMET in 7-10 days  Procedures/Testing: None  Follow-Up: 1 year with Dr.McDowell Any Additional Special Instructions Will Be Listed Below (If Applicable).     If you need a refill on your cardiac medications before your next appointment, please call your pharmacy.     Thank you for choosing [facility] !

## 2019-06-26 NOTE — Progress Notes (Signed)
Cardiology Office Note  Date: 06/26/2019   ID: [name], DOB 1929/12/09, MRN [id number]  PCP:  Asencion Noble, MD  Cardiologist:  Rozann Lesches, MD Electrophysiologist:  None   Chief Complaint  Patient presents with  . Hypertension    History of Present Illness: [name] is an 85 y.o. male that I last saw in March 2019.  He has had interval follow-up with [facility] now status post TAVR with a 34 mm Medtronic Core Valve EVOLUT R via TF approach in November 2019.  He had a follow-up echocardiogram in November 2019 as detailed below.  He presented for routine cardiac rehabilitation recently, session was canceled due to elevated blood pressure and he was scheduled to see me.  He has tracked his blood pressure at home since then, reports compliance with lisinopril at 10 mg daily.  His systolics have been running in the 140s to 170s overall, he did not have any systolics over 818.  He reports no unusual breathlessness or chest pain, has mild leg edema which has been stable.  I note that he did undergo DES to the ostial RCA prior to TAVR at [facility], has maintained Plavix therapy.  Today we reviewed his medications and discussed increasing lisinopril to 20 mg daily.  Could always consider addition of chlorthalidone or Aldactone if needed.  He will need follow-up BMET as well.  Past Medical History:  Diagnosis Date  . Aortic stenosis    Status post TAVR at [facility], November 2019  . CAD (coronary artery disease)    DES to ostial RCA October 2019 at [facility]  . Essential hypertension   . Glaucoma   . Guillain-Barre syndrome ([place]) 06/2013   Treated at [facility]  . History of nephrolithiasis   . History of pneumonia   . Hyperlipidemia   . Patent ductus arteriosus    Surgically repaired at age 85  . Prostate cancer ([place])   . Type 2 diabetes mellitus ([place])     Past Surgical History:  Procedure Laterality Date  . CARPAL TUNNEL RELEASE  2012   left  . CARPAL TUNNEL RELEASE Right  06/12/2014   Procedure: RIGHT CARPAL TUNNEL RELEASE ;  Surgeon: Wynonia Sours, MD;  Location: [place];  Service: Orthopedics;  Laterality: Right;  . CATARACT EXTRACTION W/PHACO Left 10/13/2016   Procedure: CATARACT EXTRACTION PHACO AND INTRAOCULAR LENS PLACEMENT LEFT EYE CDE=15.04;  Surgeon: Rutherford Guys, MD;  Location: [facility];  Service: Ophthalmology;  Laterality: Left;  left  . CATARACT EXTRACTION W/PHACO Right 11/03/2016   Procedure: CATARACT EXTRACTION PHACO AND INTRAOCULAR LENS PLACEMENT (IOC);  Surgeon: Rutherford Guys, MD;  Location: [facility];  Service: Ophthalmology;  Laterality: Right;  CDE: 15.10  . COLONOSCOPY    . KNEE ARTHROSCOPY  1962   left  . Patent ductus arteriosus repair     4 - age 32  . ULNAR NERVE TRANSPOSITION Right 06/12/2014   Procedure: DECOMPRESSION ULNAR NERVE RIGHT ELBOW;  Surgeon: Wynonia Sours, MD;  Location: [place];  Service: Orthopedics;  Laterality: Right;  Marland Kitchen VIDEO ASSISTED THORACOSCOPY (VATS)/THOROCOTOMY  8/14   left-guillian-barre    Current Outpatient Medications  Medication Sig Dispense Refill  . aspirin 81 MG tablet Take 81 mg by mouth every evening.     . clopidogrel (PLAVIX) 75 MG tablet Take 75 mg by mouth daily.    . ferrous sulfate 325 (65 FE) MG EC tablet Take 325 mg by mouth 3 (three) times daily with  meals.    . latanoprost (XALATAN) 0.005 % ophthalmic solution Place 1 drop into both eyes at bedtime.    . metFORMIN (GLUCOPHAGE) 500 MG tablet Take 500 mg by mouth 2 (two) times daily with a meal.    . pyridOXINE (VITAMIN B-6) 100 MG tablet Take 100 mg by mouth daily.    . simvastatin (ZOCOR) 20 MG tablet Take 20 mg by mouth at bedtime.    . vitamin B-12 (CYANOCOBALAMIN) 1000 MCG tablet Take 1,000 mcg by mouth daily.    Marland Kitchen lisinopril (ZESTRIL) 20 MG tablet Take 1 tablet (20 mg total) by mouth daily. 90 tablet 3   No current facility-administered medications for this visit.    Allergies:  Ativan [lorazepam]    Social History: The patient  reports that he has never smoked. He has never used smokeless tobacco. He reports current alcohol use. He reports that he does not use drugs.   ROS:  Please see the history of present illness. Otherwise, complete review of systems is positive for none.  All other systems are reviewed and negative.   Physical Exam: VS:  BP (!) 148/68 (BP Location: Right Arm)   Pulse 84   Temp 99.6 F (37.6 C) Comment: no air in car  Ht 5' 8.5" (1.74 m)   Wt 191 lb (86.6 kg)   SpO2 94%   BMI 28.62 kg/m , BMI Body mass index is 28.62 kg/m.  Wt Readings from Last 3 Encounters:  06/26/19 191 lb (86.6 kg)  01/13/19 195 lb 9.6 oz (88.7 kg)  03/04/18 205 lb (93 kg)    General: Elderly male, patient appears comfortable at rest. HEENT: Conjunctiva and lids normal, oropharynx clear. Neck: Supple, no elevated JVP or carotid bruits. Lungs: Clear to auscultation, nonlabored breathing at rest. Cardiac: Regular rate and rhythm, no S3, soft systolic murmur. Abdomen: Soft, nontender, bowel sounds present. Extremities: 1+ lower leg edema, right greater than left which is chronic, distal pulses 2+. Skin: Warm and dry. Musculoskeletal: No kyphosis. Neuropsychiatric: Alert and oriented x3, affect grossly appropriate.  ECG:  An ECG dated 03/04/2018 was personally reviewed today and demonstrated:  Sinus rhythm with IVCD and diffuse ST-T wave abnormalities.  Recent Labwork:  September 2019: Hemoglobin 12.3, platelets 267, potassium 5.0, BUN 24, creatinine 1.4  Other Studies Reviewed Today:  Echocardiogram 03/04/2018: Study Conclusions  - Left ventricle: The cavity size was normal. Wall thickness was   increased increased in a pattern of mild to moderate LVH.   Systolic function was vigorous. The estimated ejection fraction   was in the range of 65% to 70%. Wall motion was normal; there   were no regional wall motion abnormalities. Doppler parameters   are consistent with abnormal  left ventricular relaxation (grade 1   diastolic dysfunction). - Aortic valve: Moderately calcified annulus. Probably trileaflet;   moderately calcified leaflets. Noncoronary cusp mobility was   restricted. There was severe stenosis. Mean gradient (S): 37 mm   Hg. Peak gradient (S): 66 mm Hg. VTI ratio of LVOT to aortic   valve: 0.2. Valve area (VTI): 0.7 cm^2. Valve area (Vmax): 0.73   cm^2. - Mitral valve: Mildly calcified annulus. There was trivial   regurgitation. - Right atrium: Central venous pressure (est): 3 mm Hg. - Tricuspid valve: There was mild regurgitation. - Pulmonary arteries: PA peak pressure: 33 mm Hg (S). - Pericardium, extracardiac: There was no pericardial effusion.  Echocardiogram 11/01/2018 ([facility]): AORTIC ROOT     Size: Normal  Dissection: INDETERM FOR DISSECTION  AORTIC VALVE   Leaflets: BIOPROSTHETIC     Morphology: Normal   Mobility: Fully Mobile  LEFT VENTRICLE                   Anterior: Normal     Size: Normal                 Lateral: Normal  Contraction: Normal                 Septal: Normal  Closest EF: >55% (Estimated)            Apical: Normal   LV masses: No Masses               Inferior: Normal      LVH: MODERATE LVH             Posterior: Normal Dias.FxClass: N/A  MITRAL VALVE   Leaflets: Normal         Mobility: Fully mobile  Morphology: Normal  LEFT ATRIUM     Size: MILDLY ENLARGED   LA masses: No masses        Normal IAS  MAIN PA     Size: Normal  PULMONIC VALVE  Morphology: Normal   Mobility: Fully Mobile  RIGHT VENTRICLE     Size: Normal          Free wall: Normal  Contraction: Normal          RV masses: No Masses  TRICUSPID VALVE   Leaflets: Normal         Mobility: Fully mobile  Morphology: Normal  RIGHT ATRIUM     Size: Normal            RA Other: None   RA masses: No masses  PERICARDIUM     Fluid: No effusion  INFERIOR VENACAVA     Size: Not Seen  Not Seen  DOPPLER ECHO and OTHER SPECIAL PROCEDURES ------------------------------------   Aortic: MILD AR        BIOPROSTHETIC AoV   1.1 m/s peak vel  5 mmHg peak grad  2 mmHg mean grad 2.4 cm2 by DOPPLER  LVOT Diam: 2.0 cm. Resting LVOT Vel: 0.7 m/s. Dimensionless Index: 0.77    Mitral: N/A          No MS   MV Inflow E Vel.= nm* cm/s MV Annulus E'Vel.= nm* cm/s E/E'Ratio= nm*  Tricuspid: MILD TR        No TS  Pulmonary: TRIVIAL PR       No PS    Other:  INTERPRETATION ---------------------------------------------------------------  NORMAL LEFT VENTRICULAR SYSTOLIC FUNCTION WITH MODERATE LVH  NORMAL RIGHT VENTRICULAR SYSTOLIC FUNCTION  VALVULAR REGURGITATION: MILD AR, TRIVIAL PR, MILD TR  PROSTHETIC VALVE(S): BIOPROSTHETIC AoV   Compared with prior Echo study on 07/04/2013: S/P TAVR DEPLOYMENT, STABLE  VALVE APPARATUS WITH TRIVIAL TO MILD PARAVALVULAR LEAK.   Assessment and Plan:  1.  Severe calcific aortic stenosis status post TAVR at [facility] back November 2019.  He is overall clinically stable, I reviewed the echocardiogram report from November 2019 following the procedure.  He has a follow-up visit at [facility] scheduled for this November.  He continues on Plavix with concurrent CAD history.  2.  CAD status post DES to the ostial RCA in October 2019 at [facility].  He reports no active angina and remains on Plavix.  3.  Mixed hyperlipidemia, on Zocor.  He follows with Dr. Willey Blade.  4.  Essential hypertension.  Increase lisinopril to 20  mg daily for better control.  Follow-up BMET in the next 7 to 10 days.  Could always consider low-dose diuretic depending on response and renal function.  Medication Adjustments/Labs and Tests Ordered: Current medicines are reviewed at length with the patient today.  Concerns  regarding medicines are outlined above.   Tests Ordered: Orders Placed This Encounter  Procedures  . Basic Metabolic Panel (BMET)  . EKG 12-Lead    Medication Changes: Meds ordered this encounter  Medications  . lisinopril (ZESTRIL) 20 MG tablet    Sig: Take 1 tablet (20 mg total) by mouth daily.    Dispense:  90 tablet    Refill:  3    Disposition:  Follow up November at [facility] as scheduled, 6 months after that with [place].  Signed, Satira Sark, MD, [facility] 06/26/2019 11:33 AM    [facility] [place]. [address], [place], [place] [address] Phone: [phone]; Fax: [phone]

## 2019-06-28 ENCOUNTER — Encounter (HOSPITAL_COMMUNITY): Payer: Medicare Other

## 2019-06-30 ENCOUNTER — Encounter (HOSPITAL_COMMUNITY): Payer: Medicare Other

## 2019-07-03 ENCOUNTER — Encounter (HOSPITAL_COMMUNITY)
Admission: RE | Admit: 2019-07-03 | Discharge: 2019-07-03 | Disposition: A | Discharge status: Home / Self Care | Payer: Medicare Other | Source: Ambulatory Visit | Attending: Cardiothoracic Surgery | Admitting: Cardiothoracic Surgery

## 2019-07-03 ENCOUNTER — Other Ambulatory Visit: Payer: Self-pay

## 2019-07-03 DIAGNOSIS — Z952 Presence of prosthetic heart valve: Secondary | ICD-10-CM | POA: Diagnosis not present

## 2019-07-03 NOTE — Progress Notes (Signed)
Daily Session Note  Patient Details  Name: [name] MRN: [id number] Date of Birth: 08-18-1929 Referring Provider:     [place] from 01/13/2019 in [place]  Referring Provider  [name]      Encounter Date: 07/03/2019  Check In: Session Check In - 07/03/19 0815      Check-In   Supervising physician immediately available to respond to emergencies  See telemetry face sheet for immediately available MD    Location  [facility]    Staff Present  Russella Dar, MS, EP, [facility], Exercise Physiologist;Soleia Badolato, Exercise Physiologist;Debra Wynetta Emery, RN, BSN    Virtual Visit  No    Medication changes reported      No    Fall or balance concerns reported     Yes    Comments  Balance concerns when standing and walking. Not very steady on his feet.    Tobacco Cessation  No Change    Warm-up and Cool-down  Performed as group-led instruction    Resistance Training Performed  Yes    VAD Patient?  No    PAD/SET Patient?  No      Pain Assessment   Currently in Pain?  No/denies    Pain Score  0-No pain    Multiple Pain Sites  No       Capillary Blood Glucose: No results found for this or any previous visit (from the past 24 hour(s)).    Social History   Tobacco Use  Smoking Status Never Smoker  Smokeless Tobacco Never Used    Goals Met:  Independence with exercise equipment Exercise tolerated well No report of cardiac concerns or symptoms Strength training completed today  Goals Unmet:  Not Applicable  Comments: Pt able to follow exercise prescription today without complaint.  Will continue to monitor for progression. Check out 0915.   Dr. Kate Sable is Medical Director for [facility] Cardiac and Pulmonary Rehab.

## 2019-07-05 ENCOUNTER — Other Ambulatory Visit: Payer: Self-pay

## 2019-07-05 ENCOUNTER — Encounter (HOSPITAL_COMMUNITY)
Admission: RE | Admit: 2019-07-05 | Discharge: 2019-07-05 | Disposition: A | Discharge status: Home / Self Care | Payer: Medicare Other | Source: Ambulatory Visit | Attending: Cardiothoracic Surgery | Admitting: Cardiothoracic Surgery

## 2019-07-05 DIAGNOSIS — Z952 Presence of prosthetic heart valve: Secondary | ICD-10-CM | POA: Diagnosis not present

## 2019-07-05 NOTE — Progress Notes (Signed)
Daily Session Note  Patient Details  Name: [name] MRN: [id number] Date of Birth: [birth year] Referring Provider:     CARDIAC REHAB PHASE II ORIENTATION from 01/13/2019 in [place]  Referring Provider  [name]      Encounter Date: 07/05/2019  Check In: Session Check In - 07/05/19 0815      Check-In   Supervising physician immediately available to respond to emergencies  See telemetry face sheet for immediately available MD    Location  [facility]    Staff Present  Russella Dar, MS, EP, [facility], Exercise Physiologist;Milo Schreier, Exercise Physiologist;Debra Wynetta Emery, RN, BSN    Virtual Visit  No    Medication changes reported      No    Fall or balance concerns reported     Yes    Comments  Walks with short steps and uneasy on his feet.    Tobacco Cessation  No Change    Warm-up and Cool-down  Performed as group-led instruction    Resistance Training Performed  Yes    VAD Patient?  No    PAD/SET Patient?  No      Pain Assessment   Currently in Pain?  No/denies    Pain Score  0-No pain    Multiple Pain Sites  No       Capillary Blood Glucose: No results found for this or any previous visit (from the past 24 hour(s)).    Social History   Tobacco Use  Smoking Status Never Smoker  Smokeless Tobacco Never Used    Goals Met:  Independence with exercise equipment Exercise tolerated well No report of cardiac concerns or symptoms Strength training completed today  Goals Unmet:  Not Applicable  Comments: Pt able to follow exercise prescription today without complaint.  Will continue to monitor for progression. Check out 0915.   Dr. Kate Sable is Medical Director for [facility] Cardiac and Pulmonary Rehab.

## 2019-07-07 ENCOUNTER — Encounter (HOSPITAL_COMMUNITY)
Admission: RE | Admit: 2019-07-07 | Discharge: 2019-07-07 | Disposition: A | Discharge status: Home / Self Care | Payer: Medicare Other | Source: Ambulatory Visit | Attending: Cardiothoracic Surgery | Admitting: Cardiothoracic Surgery

## 2019-07-07 ENCOUNTER — Other Ambulatory Visit: Payer: Self-pay

## 2019-07-07 DIAGNOSIS — Z952 Presence of prosthetic heart valve: Secondary | ICD-10-CM | POA: Diagnosis not present

## 2019-07-07 NOTE — Progress Notes (Signed)
Daily Session Note  Patient Details  Name: [name] MRN: [id number] Date of Birth: 09-29-1929 Referring Provider:     CARDIAC REHAB PHASE II ORIENTATION from 01/13/2019 in [place]  Referring Provider  [name]      Encounter Date: 07/07/2019  Check In: Session Check In - 07/07/19 0815      Check-In   Supervising physician immediately available to respond to emergencies  See telemetry face sheet for immediately available MD    Location  [facility]    Staff Present  Benay Pike, Exercise Physiologist;Debra Wynetta Emery, RN, BSN    Virtual Visit  No    Medication changes reported      No    Fall or balance concerns reported     Yes    Comments  Walks with a shuffle; balance concerns    Tobacco Cessation  No Change    Warm-up and Cool-down  Performed as group-led instruction    Resistance Training Performed  Yes    VAD Patient?  No    PAD/SET Patient?  No      Pain Assessment   Currently in Pain?  No/denies    Pain Score  0-No pain    Multiple Pain Sites  No       Capillary Blood Glucose: No results found for this or any previous visit (from the past 24 hour(s)).    Social History   Tobacco Use  Smoking Status Never Smoker  Smokeless Tobacco Never Used    Goals Met:  Independence with exercise equipment Exercise tolerated well No report of cardiac concerns or symptoms Strength training completed today  Goals Unmet:  Not Applicable  Comments: Pt able to follow exercise prescription today without complaint.  Will continue to monitor for progression. Check out 0915.   Dr. Kate Sable is Medical Director for [facility].

## 2019-07-10 ENCOUNTER — Encounter (HOSPITAL_COMMUNITY)
Admission: RE | Admit: 2019-07-10 | Discharge: 2019-07-10 | Disposition: A | Discharge status: Home / Self Care | Payer: Medicare Other | Source: Ambulatory Visit | Attending: Cardiothoracic Surgery | Admitting: Cardiothoracic Surgery

## 2019-07-10 ENCOUNTER — Other Ambulatory Visit: Payer: Self-pay

## 2019-07-10 DIAGNOSIS — Z952 Presence of prosthetic heart valve: Secondary | ICD-10-CM | POA: Diagnosis not present

## 2019-07-10 NOTE — Progress Notes (Signed)
Cardiac Individual Treatment Plan  Patient Details  Name: [name] MRN: [id number] Date of Birth: October 04, 1929 Referring Provider:     CARDIAC REHAB [place] from 01/13/2019 in [place]  Referring Provider  [place]      Initial Encounter Date:    CARDIAC REHAB PHASE II ORIENTATION from 01/13/2019 in [place]  Date  01/13/19      Visit Diagnosis: S/P TAVR (transcatheter aortic valve replacement)   Patient's Home Medications on Admission:  Current Outpatient Medications:  .  aspirin 81 MG tablet, Take 81 mg by mouth every evening. , Disp: , Rfl:  .  clopidogrel (PLAVIX) 75 MG tablet, Take 75 mg by mouth daily., Disp: , Rfl:  .  ferrous sulfate 325 (65 FE) MG EC tablet, Take 325 mg by mouth 3 (three) times daily with meals., Disp: , Rfl:  .  latanoprost (XALATAN) 0.005 % ophthalmic solution, Place 1 drop into both eyes at bedtime., Disp: , Rfl:  .  lisinopril (ZESTRIL) 20 MG tablet, Take 1 tablet (20 mg total) by mouth daily., Disp: 90 tablet, Rfl: 3 .  metFORMIN (GLUCOPHAGE) 500 MG tablet, Take 500 mg by mouth 2 (two) times daily with a meal., Disp: , Rfl:  .  pyridOXINE (VITAMIN B-6) 100 MG tablet, Take 100 mg by mouth daily., Disp: , Rfl:  .  simvastatin (ZOCOR) 20 MG tablet, Take 20 mg by mouth at bedtime., Disp: , Rfl:  .  vitamin B-12 (CYANOCOBALAMIN) 1000 MCG tablet, Take 1,000 mcg by mouth daily., Disp: , Rfl:   Past Medical History: Past Medical History:  Diagnosis Date  . Aortic stenosis    Status post TAVR at [facility], November 2019  . CAD (coronary artery disease)    DES to ostial RCA October 2019 at [facility]  . Essential hypertension   . Glaucoma   . Guillain-Barre syndrome ([place]) 06/2013   Treated at [facility]  . History of nephrolithiasis   . History of pneumonia   . Hyperlipidemia   . Patent ductus arteriosus    Surgically repaired at age 41  . Prostate cancer ([place])   . Type 2 diabetes mellitus (HCC)      Tobacco Use: Social History   Tobacco Use  Smoking Status Never Smoker  Smokeless Tobacco Never Used    Labs: Recent Review Flowsheet Data    There is no flowsheet data to display.      Capillary Blood Glucose: Lab Results  Component Value Date   GLUCAP 134 (H) 11/03/2016   GLUCAP 110 (H) 10/13/2016   GLUCAP 134 (H) 06/12/2014   GLUCAP 135 (H) 09/11/2013   GLUCAP 124 (H) 09/08/2013     Exercise Target Goals: Exercise Program Goal: Individual exercise prescription set using results from initial 6 min walk test and THRR while considering  patient's activity barriers and safety.   Exercise Prescription Goal: Starting with aerobic activity 30 plus minutes a day, 3 days per week for initial exercise prescription. Provide home exercise prescription and guidelines that participant acknowledges understanding prior to discharge.  Activity Barriers & Risk Stratification: Activity Barriers & Cardiac Risk Stratification - 01/13/19 1035      Activity Barriers & Cardiac Risk Stratification   Activity Barriers  Balance Concerns;Muscular Weakness;Joint Problems;Other (comment)    Comments  knee pain     Cardiac Risk Stratification  High       6 Minute Walk: 6 Minute Walk    Row Name 01/13/19 1034  6 Minute Walk   Phase  Initial     Distance  770 feet     Walk Time  6 minutes     # of Rest Breaks  0     MPH  1.46     METS  2.11     RPE  9     Perceived Dyspnea   8     VO2 Peak  3.78     Symptoms  No     Resting HR  74 bpm     Resting BP  150/66     Resting Oxygen Saturation   94 %     Exercise Oxygen Saturation  during 6 min walk  96 %     Max Ex. HR  97 bpm     Max Ex. BP  146/62     2 Minute Post BP  122/64        Oxygen Initial Assessment:   Oxygen Re-Evaluation:   Oxygen Discharge (Final Oxygen Re-Evaluation):   Initial Exercise Prescription: Initial Exercise Prescription - 01/13/19 1000      Date of Initial Exercise RX and Referring  Provider   Date  01/13/19    Referring Provider  [name]    Expected Discharge Date  04/14/19      NuStep   Level  1    SPM  58    Minutes  17    METs  1.8      Arm Ergometer   Level  1    Watts  11    RPM  51    Minutes  17    METs  1.8      Prescription Details   Frequency (times per week)  3    Duration  Progress to 30 minutes of continuous aerobic without signs/symptoms of physical distress      Intensity   THRR 40-80% of Max Heartrate  [phone]    Ratings of Perceived Exertion  11-13    Perceived Dyspnea  0-4      Progression   Progression  Continue to progress workloads to maintain intensity without signs/symptoms of physical distress.      Resistance Training   Training Prescription  Yes    Weight  1    Reps  10-15       Perform Capillary Blood Glucose checks as needed.  Exercise Prescription Changes:  Exercise Prescription Changes    Row Name 01/17/19 1000 02/03/19 1400 02/14/19 0800 02/28/19 0700 03/13/19 0900     Response to Exercise   Blood Pressure (Admit)  132/64  140/68  138/60  164/50  120/58   Blood Pressure (Exercise)  180/62  180/70  168/62  180/70  190/70   Blood Pressure (Exit)  144/72  160/50  128/52  150/74  148/60   Heart Rate (Admit)  83 bpm  98 bpm  78 bpm  77 bpm  80 bpm   Heart Rate (Exercise)  115 bpm  113 bpm  119 bpm  113 bpm  120 bpm   Heart Rate (Exit)  92 bpm  92 bpm  87 bpm  86 bpm  88 bpm   Rating of Perceived Exertion (Exercise)  11  11  11  11  11    Comments  First day of exercise!   first two weeks of exercise  -  increase in overall MET level   increase in overall MET level    Duration  Continue with 30 min of aerobic exercise  without signs/symptoms of physical distress.  Continue with 30 min of aerobic exercise without signs/symptoms of physical distress.  Continue with 30 min of aerobic exercise without signs/symptoms of physical distress.  Continue with 30 min of aerobic exercise without signs/symptoms of physical distress.   Continue with 30 min of aerobic exercise without signs/symptoms of physical distress.   Intensity  THRR New [phone]  THRR unchanged  THRR unchanged  THRR unchanged  THRR unchanged     Progression   Progression  Continue to progress workloads to maintain intensity without signs/symptoms of physical distress.  Continue to progress workloads to maintain intensity without signs/symptoms of physical distress.  Continue to progress workloads to maintain intensity without signs/symptoms of physical distress.  Continue to progress workloads to maintain intensity without signs/symptoms of physical distress.  Continue to progress workloads to maintain intensity without signs/symptoms of physical distress.   Average METs  2.05  2.5  2.55  2.95  3.2     Resistance Training   Training Prescription  Yes  Yes  Yes  Yes  Yes   Weight  1  1  1  2  2    Reps  10-15  10-15  10-15  10-15  10-15     NuStep   Level  1  1  2  2  3    SPM  81  102  98  104  101   Minutes  17  17  17  17  17    METs  2.2  3  2.4  2.8  3.2     Arm Ergometer   Level  1  1  2  3  3    Watts  12  14  22  27  30    RPM  54  62  59  56  59   Minutes  22  22  22  22  22    METs  1.9  2  2.7  3.1  3.2     Home Exercise Plan   Plans to continue exercise at  Home (comment)  Home (comment)  Home (comment)  Home (comment)  Home (comment)   Frequency  Add 2 additional days to program exercise sessions.  Add 2 additional days to program exercise sessions.  Add 2 additional days to program exercise sessions.  Add 2 additional days to program exercise sessions.  Add 2 additional days to program exercise sessions.   Initial Home Exercises Provided  01/13/19  01/13/19  01/13/19  01/13/19  01/13/19   Row Name 07/06/19 1200             Response to Exercise   Blood Pressure (Admit)  140/50       Blood Pressure (Exercise)  158/50       Blood Pressure (Exit)  128/50       Heart Rate (Admit)  96 bpm       Heart Rate (Exercise)  111 bpm        Heart Rate (Exit)  102 bpm       Rating of Perceived Exertion (Exercise)  11       Comments  first two weeks back        Duration  Continue with 30 min of aerobic exercise without signs/symptoms of physical distress.       Intensity  THRR unchanged         Progression   Progression  Continue to progress workloads to maintain intensity without signs/symptoms of physical distress.  Average METs  2.1         Resistance Training   Training Prescription  Yes       Weight  2       Reps  10-15         NuStep   Level  2       SPM  100       Minutes  17       METs  2         Arm Ergometer   Level  1       Watts  10       RPM  52       Minutes  22       METs  2.2         Home Exercise Plan   Plans to continue exercise at  Home (comment)       Frequency  Add 2 additional days to program exercise sessions.       Initial Home Exercises Provided  01/13/19          Exercise Comments:  Exercise Comments    Row Name 01/24/19 0801 02/21/19 0743 03/13/19 0946 07/06/19 1227 07/10/19 1336   Exercise Comments  Pt. is new to CR. He has attended 5 sessions so far. He has tolerated the NuStep and Arm Ergometer well. We will continue to monitor and progress as he increases strength.   Pt. has attended 17 sessions in the  program. He is consistant which has helped him to get in a routine as well as see progress in his strength. He states he feels stronger since beginning the program. He would still like to improve his balance and mobility the best he can.   Pt. is doing great in CR. He is eager to get here everyday to work hard. He wantes to stay active outside of here and states he is feeling more comfortable to do so.   Pt. has returned after being out due to [place] and high blood pressure. He has definitley lost some strength and stamina over this period. We will continue to monitor him and aid in the regaining of his strength.  Pt. has returned since being out due to [place] and then his high blood  pressure. He is always eager and ready to exercise. He is unable to do a lot but tolerates the Arm Ergometer and Nustep well. He wants to stay as active as possible so that he doesn't lose anymore of his mobility and strength.      Exercise Goals and Review:  Exercise Goals    Row Name 01/13/19 1038             Exercise Goals   Increase Physical Activity  Yes       Intervention  Provide advice, education, support and counseling about physical activity/exercise needs.;Develop an individualized exercise prescription for aerobic and resistive training based on initial evaluation findings, risk stratification, comorbidities and participant's personal goals.       Expected Outcomes  Short Term: Attend rehab on a regular basis to increase amount of physical activity.       Increase Strength and Stamina  Yes       Intervention  Provide advice, education, support and counseling about physical activity/exercise needs.       Expected Outcomes  Short Term: Increase workloads from initial exercise prescription for resistance, speed, and METs.       Able to understand and use rate  of perceived exertion (RPE) scale  Yes       Intervention  Provide education and explanation on how to use RPE scale       Expected Outcomes  Short Term: Able to use RPE daily in rehab to express subjective intensity level;Long Term:  Able to use RPE to guide intensity level when exercising independently       Able to understand and use Dyspnea scale  Yes       Intervention  Provide education and explanation on how to use Dyspnea scale       Expected Outcomes  Short Term: Able to use Dyspnea scale daily in rehab to express subjective sense of shortness of breath during exertion;Long Term: Able to use Dyspnea scale to guide intensity level when exercising independently       Knowledge and understanding of Target Heart Rate Range (THRR)  Yes       Intervention  Provide education and explanation of THRR including how the numbers  were predicted and where they are located for reference       Expected Outcomes  Short Term: Able to state/look up THRR       Able to check pulse independently  Yes       Intervention  Provide education and demonstration on how to check pulse in carotid and radial arteries.;Review the importance of being able to check your own pulse for safety during independent exercise       Expected Outcomes  Short Term: Able to explain why pulse checking is important during independent exercise;Long Term: Able to check pulse independently and accurately       Understanding of Exercise Prescription  Yes       Intervention  Provide education, explanation, and written materials on patient's individual exercise prescription       Expected Outcomes  Short Term: Able to explain program exercise prescription;Long Term: Able to explain home exercise prescription to exercise independently          Exercise Goals Re-Evaluation : Exercise Goals Re-Evaluation    Row Name 01/24/19 0759 02/21/19 0741 03/13/19 0943 07/10/19 1333       Exercise Goal Re-Evaluation   Exercise Goals Review  Increase Physical Activity;Increase Strength and Stamina;Able to understand and use rate of perceived exertion (RPE) scale;Knowledge and understanding of Target Heart Rate Range (THRR);Able to check pulse independently;Understanding of Exercise Prescription  Increase Physical Activity;Increase Strength and Stamina;Able to understand and use rate of perceived exertion (RPE) scale;Knowledge and understanding of Target Heart Rate Range (THRR);Able to check pulse independently;Understanding of Exercise Prescription  Increase Physical Activity;Increase Strength and Stamina;Able to understand and use rate of perceived exertion (RPE) scale;Knowledge and understanding of Target Heart Rate Range (THRR);Able to check pulse independently;Understanding of Exercise Prescription  Increase Physical Activity;Increase Strength and Stamina;Able to understand and  use rate of perceived exertion (RPE) scale;Knowledge and understanding of Target Heart Rate Range (THRR);Able to check pulse independently;Understanding of Exercise Prescription    Comments  Pt. is still fairly new to the program. He has attended 5 sessions so far including his orientation. He has been able to tolerate the exercise well. We will monitor and progress as needed.  Pt. is doing great in the program. He is tolerating all the exercise well and has shown his ability to work hard to increase his overall MET level every session.  Pt. continues to do well in the program. He has attended 26 sessions so far and continues to push himself everyday.  Pt. has returned to Cardiac Rehab after getting his blood pressure under control. He is always happy to be here and works hard to maintain his mobility.    Expected Outcomes  improve mobility to stay active.   improve balance and mobility to stay active.   improve balance and mobility to stay active.   Short: get back in to an exercise routine. Long: improve mobility        Discharge Exercise Prescription (Final Exercise Prescription Changes): Exercise Prescription Changes - 07/06/19 1200      Response to Exercise   Blood Pressure (Admit)  140/50    Blood Pressure (Exercise)  158/50    Blood Pressure (Exit)  128/50    Heart Rate (Admit)  96 bpm    Heart Rate (Exercise)  111 bpm    Heart Rate (Exit)  102 bpm    Rating of Perceived Exertion (Exercise)  11    Comments  first two weeks back     Duration  Continue with 30 min of aerobic exercise without signs/symptoms of physical distress.    Intensity  THRR unchanged      Progression   Progression  Continue to progress workloads to maintain intensity without signs/symptoms of physical distress.    Average METs  2.1      Resistance Training   Training Prescription  Yes    Weight  2    Reps  10-15      NuStep   Level  2    SPM  100    Minutes  17    METs  2      Arm Ergometer   Level   1    Watts  10    RPM  52    Minutes  22    METs  2.2      Home Exercise Plan   Plans to continue exercise at  Home (comment)    Frequency  Add 2 additional days to program exercise sessions.    Initial Home Exercises Provided  01/13/19       Nutrition:  Target Goals: Understanding of nutrition guidelines, daily intake of sodium <153m, cholesterol <2064m calories 30% from fat and 7% or less from saturated fats, daily to have 5 or more servings of fruits and vegetables.  Biometrics: Pre Biometrics - 01/13/19 1038      Pre Biometrics   Height  5' 9"  (1.753 m)    Weight  88.7 kg    Waist Circumference  43 inches    Hip Circumference  41.5 inches    Waist to Hip Ratio  1.04 %    BMI (Calculated)  28.87    Triceps Skinfold  10 mm    % Body Fat  23.3 %    Grip Strength  8 kg        Nutrition Therapy Plan and Nutrition Goals: Nutrition Therapy & Goals - 07/10/19 1528      Nutrition Therapy   RD appointment deferred  Yes      Intervention Plan   Intervention  Nutrition handout(s) given to patient.       Nutrition Assessments: Nutrition Assessments - 01/13/19 1011      MEDFICTS Scores   Pre Score  72       Nutrition Goals Re-Evaluation:   Nutrition Goals Discharge (Final Nutrition Goals Re-Evaluation):   Psychosocial: Target Goals: Acknowledge presence or absence of significant depression and/or stress, maximize coping skills, provide positive support system. Participant is able to  verbalize types and ability to use techniques and skills needed for reducing stress and depression.  Initial Review & Psychosocial Screening: Initial Psych Review & Screening - 01/13/19 1022      Initial Review   Current issues with  None Identified      Family Dynamics   Good Support System?  Yes      Barriers   Psychosocial barriers to participate in program  There are no identifiable barriers or psychosocial needs.      Screening Interventions   Interventions   Encouraged to exercise       Quality of Life Scores: Quality of Life - 01/13/19 1039      Quality of Life   Select  Quality of Life      Quality of Life Scores   Health/Function Pre  18.47 %    Socioeconomic Pre  24.83 %    Psych/Spiritual Pre  25.29 %    Family Pre  19.5 %    GLOBAL Pre  21.28 %      Scores of 19 and below usually indicate a poorer quality of life in these areas.  A difference of  2-3 points is a clinically meaningful difference.  A difference of 2-3 points in the total score of the Quality of Life Index has been associated with significant improvement in overall quality of life, self-image, physical symptoms, and general health in studies assessing change in quality of life.  PHQ-9: Recent Review Flowsheet Data    Depression screen [facility] 2/9 01/13/2019   Decreased Interest 0   Down, Depressed, Hopeless 0   PHQ - 2 Score 0   Altered sleeping 0   Tired, decreased energy 2   Change in appetite 0   Feeling bad or failure about yourself  0   Trouble concentrating 0   Moving slowly or fidgety/restless 0   Suicidal thoughts 0   PHQ-9 Score 2   Difficult doing work/chores Not difficult at all     Interpretation of Total Score  Total Score Depression Severity:  1-4 = Minimal depression, 5-9 = Mild depression, 10-14 = Moderate depression, 15-19 = Moderately severe depression, 20-27 = Severe depression   Psychosocial Evaluation and Intervention: Psychosocial Evaluation - 01/13/19 1041      Psychosocial Evaluation & Interventions   Interventions  Encouraged to exercise with the program and follow exercise prescription;Stress management education;Relaxation education    Comments  Patient has no psychosocial issues identified at orientation. His initial QOL score was 21.28 and his PHQ-9 score was 2.     Expected Outcomes  Patient will have no psychosocial issues identified at discharge.     Continue Psychosocial Services   No Follow up required       Psychosocial  Re-Evaluation: Psychosocial Re-Evaluation    [place] Name 01/26/19 [phone] 02/22/19 1454 03/13/19 1104 07/10/19 1534       Psychosocial Re-Evaluation   Current issues with  None Identified  None Identified  None Identified  -    Comments  Patient's initial QOL score was 21.28 and his PHQ_9 score was 1 with no pyschosocial issues identified. Will continue to monitor for progress.   Patient's initial QOL score was 21.28 and his PHQ_9 score was 1 with no pyschosocial issues identified. Will continue to monitor for progress.   Patient's initial QOL score was 21.28 and his PHQ_9 score was 1 with no pyschosocial issues identified. Will continue to monitor for progress.   Patient's initial QOL score was 21.28 and his  PHQ_9 score was 1 with no pyschosocial issues identified. Will continue to monitor for progress.     Expected Outcomes  Patient will have no psychosocial issues identified at discharge.   Patient will have no psychosocial issues identified at discharge.   Patient will have no psychosocial issues identified at discharge.   Patient will have no psychosocial issues identified at discharge.     Interventions  Stress management education;Relaxation education;Encouraged to attend Cardiac Rehabilitation for the exercise  Stress management education;Relaxation education;Encouraged to attend Cardiac Rehabilitation for the exercise  Stress management education;Relaxation education;Encouraged to attend Cardiac Rehabilitation for the exercise  Stress management education;Relaxation education;Encouraged to attend Cardiac Rehabilitation for the exercise    Continue Psychosocial Services   No Follow up required  No Follow up required  No Follow up required  -       Psychosocial Discharge (Final Psychosocial Re-Evaluation): Psychosocial Re-Evaluation - 07/10/19 1534      Psychosocial Re-Evaluation   Comments  Patient's initial QOL score was 21.28 and his PHQ_9 score was 1 with no pyschosocial issues identified.  Will continue to monitor for progress.     Expected Outcomes  Patient will have no psychosocial issues identified at discharge.     Interventions  Stress management education;Relaxation education;Encouraged to attend Cardiac Rehabilitation for the exercise       Vocational Rehabilitation: Provide vocational rehab assistance to qualifying candidates.   Vocational Rehab Evaluation & Intervention: Vocational Rehab - 01/13/19 1018      Initial Vocational Rehab Evaluation & Intervention   Assessment shows need for Vocational Rehabilitation  No      Vocational Rehab Re-Evaulation   Comments  Patient is retired and not interested in returning to employment.        Education: Education Goals: Education classes will be provided on a weekly basis, covering required topics. Participant will state understanding/return demonstration of topics presented.  Learning Barriers/Preferences: Learning Barriers/Preferences - 01/13/19 1025      Learning Barriers/Preferences   Learning Barriers  None    Learning Preferences  Skilled Demonstration       Education Topics: Hypertension, Hypertension Reduction -Define heart disease and high blood pressure. Discus how high blood pressure affects the body and ways to reduce high blood pressure.   CARDIAC REHAB PHASE II EXERCISE from 07/05/2019 in [place]  Date  01/25/19  Educator  DJ  Instruction Review Code  2- Demonstrated Understanding      Exercise and Your Heart -Discuss why it is important to exercise, the FITT principles of exercise, normal and abnormal responses to exercise, and how to exercise safely.   CARDIAC REHAB PHASE II EXERCISE from 07/05/2019 in [place]  Date  02/01/19  Educator  Wynetta Emery  Instruction Review Code  2- Demonstrated Understanding      Angina -Discuss definition of angina, causes of angina, treatment of angina, and how to decrease risk of having angina.   CARDIAC  REHAB PHASE II EXERCISE from 07/05/2019 in [place]  Date  02/08/19  Educator  Wynetta Emery  Instruction Review Code  2- Demonstrated Understanding      Cardiac Medications -Review what the following cardiac medications are used for, how they affect the body, and side effects that may occur when taking the medications.  Medications include Aspirin, Beta blockers, calcium channel blockers, ACE Inhibitors, angiotensin receptor blockers, diuretics, digoxin, and antihyperlipidemics.   CARDIAC REHAB PHASE II EXERCISE from 07/05/2019 in [place]  Date  02/15/19  Educator  Wynetta Emery  Instruction Review Code  2- Demonstrated Understanding      Congestive Heart Failure -Discuss the definition of CHF, how to live with CHF, the signs and symptoms of CHF, and how keep track of weight and sodium intake.   CARDIAC REHAB PHASE II EXERCISE from 07/05/2019 in [place]  Date  02/22/19  Educator  Wynetta Emery  Instruction Review Code  2- Demonstrated Understanding      Heart Disease and Intimacy -Discus the effect sexual activity has on the heart, how changes occur during intimacy as we age, and safety during sexual activity.   CARDIAC REHAB PHASE II EXERCISE from 07/05/2019 in [place]  Date  03/01/19  Educator  Wynetta Emery  Instruction Review Code  2- Demonstrated Understanding      Smoking Cessation / COPD -Discuss different methods to quit smoking, the health benefits of quitting smoking, and the definition of COPD.   Nutrition I: Fats -Discuss the types of cholesterol, what cholesterol does to the heart, and how cholesterol levels can be controlled.   Nutrition II: Labels -Discuss the different components of food labels and how to read food label   Heart Parts/Heart Disease and PAD -Discuss the anatomy of the heart, the pathway of blood circulation through the heart, and these are affected by heart  disease.   Stress I: Signs and Symptoms -Discuss the causes of stress, how stress may lead to anxiety and depression, and ways to limit stress.   Stress II: Relaxation -Discuss different types of relaxation techniques to limit stress.   Warning Signs of Stroke / TIA -Discuss definition of a stroke, what the signs and symptoms are of a stroke, and how to identify when someone is having stroke.   CARDIAC REHAB PHASE II EXERCISE from 07/05/2019 in [place]  Date  01/18/19  Educator  DJ  Instruction Review Code  2- Demonstrated Understanding      Knowledge Questionnaire Score: Knowledge Questionnaire Score - 01/13/19 1011      Knowledge Questionnaire Score   Pre Score  19/24       Core Components/Risk Factors/Patient Goals at Admission: Personal Goals and Risk Factors at Admission - 01/13/19 1020      Core Components/Risk Factors/Patient Goals on Admission    Weight Management  Weight Maintenance    Diabetes  Yes    Intervention  Provide education about signs/symptoms and action to take for hypo/hyperglycemia.;Provide education about proper nutrition, including hydration, and aerobic/resistive exercise prescription along with prescribed medications to achieve blood glucose in normal ranges: Fasting glucose 65-99 mg/dL    Expected Outcomes  Long Term: Attainment of HbA1C < 7%.;Short Term: Participant verbalizes understanding of the signs/symptoms and immediate care of hyper/hypoglycemia, proper foot care and importance of medication, aerobic/resistive exercise and nutrition plan for blood glucose control.    Hypertension  Yes    Intervention  Provide education on lifestyle modifcations including regular physical activity/exercise, weight management, moderate sodium restriction and increased consumption of fresh fruit, vegetables, and low fat dairy, alcohol moderation, and smoking cessation.    Expected Outcomes  Short Term: Continued assessment and  intervention until BP is < 140/2m HG in hypertensive participants. < 130/854mHG in hypertensive participants with diabetes, heart failure or chronic kidney disease.    Personal Goal Other  Yes    Personal Goal  Improve mobility; stay active.     Intervention  Patient will attend CR 3 days/week and will supplement with  exercise 2 days/week.    Expected Outcomes  Patient will meet his personal goals.        Core Components/Risk Factors/Patient Goals Review:  Goals and Risk Factor Review    Row Name 01/26/19 0806 02/22/19 1448 03/13/19 1059 07/10/19 1529       Core Components/Risk Factors/Patient Goals Review   Personal Goals Review  Hypertension;Diabetes;Weight Management/Obesity Improve mobility; stay active.   Hypertension;Diabetes;Weight Management/Obesity Improve mobility; stay active.   Hypertension;Diabetes;Weight Management/Obesity Improve mobility; stay active.   Hypertension;Diabetes;Weight Management/Obesity Improve mobility; stay active.    Review  Patient has completed 6 sessions maintaining his weight since last 30 day review. He is doing well in the program with some progression. He is new to the program. Will continue to monitor for progress.   Patient has completed 18 session gaining 1 lb since last 30 day review. He is doing well in the program with progression. 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 His last A1C was in February 2020 at 5.50m/dl. His reported fasting glucose readings average 130 mg/dl. Will continue to monitor for progress.   Patient has completed 23 sessions maintaining his weight since last 30 day review. He continues to do well in the program with progression. He continues to say he feels stronger and has more energy. He has had no A1C's since last 30 day review. His reported fasting glucose readings are averaging 130 mg/dl. His pcp added his Lisonopril 10 mg daily on  02/15/19 with no improvement in his blood pressure. Staff reached out to cardiology to make him an appointment for tighter blood pressue control. Outpatient cardiac rehab services have been suspended due to the COVID-19 restrictions. Will continue to monitor.   Cardiac Rehab closed 03/06/19 and reopened 06/19/19. Patient did return to the program losing 6 lbs since March. His blood pressue was elevated the first day he returned and he was not allowed to exercise. He saw Dr. MDomenic Polite7/6/20 and he increased his Lisinopril from 10 mg to 20 mg daily. His blood pressue has improved but still hypertensive at times. He is keeping a log for the MD. He says coming to the program helps him stay active. He demonstrates a more steady gait today than 3 weeks ago. He says his mobility has improved some. He does feel the program is benefiting him. His reported glucose readings are averaging 130 mg/dl. Will continue to monitor for progress.    Expected Outcomes  Patient will continue to attend sessions and complete the program meeting his personal goals.   Patient will continue to attend sessions and complete the program meeting his personal goals.   Patient will continue to attend sessions and complete the program meeting his personal goals.   Patient will continue to attend sessions and complete the program meeting his personal goals.        Core Components/Risk Factors/Patient Goals at Discharge (Final Review):  Goals and Risk Factor Review - 07/10/19 1529      Core Components/Risk Factors/Patient Goals Review   Personal Goals Review  Hypertension;Diabetes;Weight Management/Obesity   Improve mobility; stay active.   Review  Cardiac Rehab closed 03/06/19 and reopened 06/19/19. Patient did return to the program losing 6 lbs since March. His blood pressue was elevated the first day he returned and he was not allowed to exercise. He saw Dr. MDomenic Polite7/6/20 and he increased his Lisinopril from 10 mg to 20  mg daily. His blood  pressue has improved but still hypertensive at times. He is keeping a log for the MD. He says coming to the program helps him stay active. He demonstrates a more steady gait today than 3 weeks ago. He says his mobility has improved some. He does feel the program is benefiting him. His reported glucose readings are averaging 130 mg/dl. Will continue to monitor for progress.    Expected Outcomes  Patient will continue to attend sessions and complete the program meeting his personal goals.        ITP Comments: ITP Comments    Row Name 01/13/19 1035 03/10/19 1308         ITP Comments  Patient is an 85 year old male referred to CR for TVAR. He has a h/o guillain barre causing an unsteady gait and a h/o falls.   Cardiac Rehab services have been suspended due to the COVID-19 restrictions starting 03/06/2019. Patient will restart the progam when restrictions have been lifted.         Comments: ITP REVIEW Patient doing well in the program. Will continue to monitor for progress.

## 2019-07-10 NOTE — Progress Notes (Signed)
Daily Session Note  Patient Details  Name: [name] MRN: [id number] Date of Birth: 01-08-1929 Referring Provider:     CARDIAC REHAB [place] from 01/13/2019 in [place]  Referring Provider  [name]      Encounter Date: 07/10/2019  Check In: Session Check In - 07/10/19 0815      Check-In   Supervising physician immediately available to respond to emergencies  See telemetry face sheet for immediately available MD    Location  [facility]    Staff Present  Russella Dar, MS, EP, [facility], Exercise Physiologist;Rollyn Scialdone, Exercise Physiologist;Debra Wynetta Emery, RN, BSN    Virtual Visit  No    Medication changes reported      No    Fall or balance concerns reported     Yes    Comments  walks with a stutter step, balance concern.    Tobacco Cessation  No Change    Warm-up and Cool-down  Performed as group-led instruction    Resistance Training Performed  Yes    VAD Patient?  No    PAD/SET Patient?  No      Pain Assessment   Currently in Pain?  No/denies    Pain Score  0-No pain    Multiple Pain Sites  No       Capillary Blood Glucose: No results found for this or any previous visit (from the past 24 hour(s)).    Social History   Tobacco Use  Smoking Status Never Smoker  Smokeless Tobacco Never Used    Goals Met:  Independence with exercise equipment Exercise tolerated well No report of cardiac concerns or symptoms Strength training completed today  Goals Unmet:  Not Applicable  Comments: Pt able to follow exercise prescription today without complaint.  Will continue to monitor for progression. Check out 0915.   Dr. Kate Sable is Medical Director for [facility] Cardiac and Pulmonary Rehab.

## 2019-07-12 ENCOUNTER — Other Ambulatory Visit: Payer: Self-pay

## 2019-07-12 ENCOUNTER — Encounter (HOSPITAL_COMMUNITY)
Admission: RE | Admit: 2019-07-12 | Discharge: 2019-07-12 | Disposition: A | Discharge status: Home / Self Care | Payer: Medicare Other | Source: Ambulatory Visit | Attending: Cardiothoracic Surgery | Admitting: Cardiothoracic Surgery

## 2019-07-12 DIAGNOSIS — Z952 Presence of prosthetic heart valve: Secondary | ICD-10-CM | POA: Diagnosis not present

## 2019-07-12 NOTE — Progress Notes (Signed)
Daily Session Note  Patient Details  Name: [name] MRN: [id number] Date of Birth: 07/01/1929 Referring Provider:     CARDIAC REHAB PHASE II ORIENTATION from 01/13/2019 in [place]  Referring Provider  [name]      Encounter Date: 07/12/2019  Check In: Session Check In - 07/12/19 0815      Check-In   Supervising physician immediately available to respond to emergencies  See telemetry face sheet for immediately available MD    Location  [facility]    Staff Present  Russella Dar, MS, EP, [facility], Exercise Physiologist;Mathews Stuhr, Exercise Physiologist;Debra Wynetta Emery, RN, BSN    Virtual Visit  No    Medication changes reported      No    Fall or balance concerns reported     Yes    Comments  walks with stutter step, balance concerns    Tobacco Cessation  No Change    Warm-up and Cool-down  Performed as group-led instruction    Resistance Training Performed  Yes    VAD Patient?  No    PAD/SET Patient?  No      Pain Assessment   Currently in Pain?  No/denies    Pain Score  0-No pain    Multiple Pain Sites  No       Capillary Blood Glucose: No results found for this or any previous visit (from the past 24 hour(s)).    Social History   Tobacco Use  Smoking Status Never Smoker  Smokeless Tobacco Never Used    Goals Met:  Independence with exercise equipment Exercise tolerated well No report of cardiac concerns or symptoms Strength training completed today  Goals Unmet:  Not Applicable  Comments: Pt able to follow exercise prescription today without complaint.  Will continue to monitor for progression. Check out 0915.   Dr. Kate Sable is Medical Director for [facility] Cardiac and Pulmonary Rehab.

## 2019-07-14 ENCOUNTER — Encounter (HOSPITAL_COMMUNITY)
Admission: RE | Admit: 2019-07-14 | Discharge: 2019-07-14 | Disposition: A | Discharge status: Home / Self Care | Payer: Medicare Other | Source: Ambulatory Visit | Attending: Cardiothoracic Surgery | Admitting: Cardiothoracic Surgery

## 2019-07-14 ENCOUNTER — Other Ambulatory Visit: Payer: Self-pay

## 2019-07-14 DIAGNOSIS — Z952 Presence of prosthetic heart valve: Secondary | ICD-10-CM

## 2019-07-14 NOTE — Progress Notes (Signed)
Daily Session Note  Patient Details  Name: [name] MRN: [id number] Date of Birth: 04-05-1929 Referring Provider:     CARDIAC REHAB PHASE II ORIENTATION from 01/13/2019 in [place]  Referring Provider  [name]      Encounter Date: 07/14/2019  Check In: Session Check In - 07/14/19 0815      Check-In   Supervising physician immediately available to respond to emergencies  See telemetry face sheet for immediately available MD    Location  [facility]    Staff Present  Russella Dar, MS, EP, [facility], Exercise Physiologist;Addalie Calles, Exercise Physiologist;Debra Wynetta Emery, RN, BSN    Virtual Visit  No    Medication changes reported      No    Fall or balance concerns reported     No    Tobacco Cessation  No Change    Warm-up and Cool-down  Performed as group-led instruction    Resistance Training Performed  Yes    VAD Patient?  No    PAD/SET Patient?  No      Pain Assessment   Currently in Pain?  No/denies    Pain Score  0-No pain    Multiple Pain Sites  No       Capillary Blood Glucose: No results found for this or any previous visit (from the past 24 hour(s)).    Social History   Tobacco Use  Smoking Status Never Smoker  Smokeless Tobacco Never Used    Goals Met:  Independence with exercise equipment Exercise tolerated well No report of cardiac concerns or symptoms Strength training completed today  Goals Unmet:  Not Applicable  Comments: Pt able to follow exercise prescription today without complaint.  Will continue to monitor for progression. Check out 0915.   Dr. Kate Sable is Medical Director for [facility] Cardiac and Pulmonary Rehab.

## 2019-07-17 ENCOUNTER — Encounter (HOSPITAL_COMMUNITY)
Admission: RE | Admit: 2019-07-17 | Discharge: 2019-07-17 | Disposition: A | Discharge status: Home / Self Care | Payer: Medicare Other | Source: Ambulatory Visit | Attending: Cardiothoracic Surgery | Admitting: Cardiothoracic Surgery

## 2019-07-17 ENCOUNTER — Other Ambulatory Visit: Payer: Self-pay

## 2019-07-17 DIAGNOSIS — Z952 Presence of prosthetic heart valve: Secondary | ICD-10-CM | POA: Diagnosis not present

## 2019-07-17 NOTE — Progress Notes (Signed)
Daily Session Note  Patient Details  Name: [name] MRN: [id number] Date of Birth: [birth year] Referring Provider:     CARDIAC REHAB PHASE II ORIENTATION from 01/13/2019 in [place]  Referring Provider  [name]      Encounter Date: 07/17/2019  Check In: Session Check In - 07/17/19 0815      Check-In   Supervising physician immediately available to respond to emergencies  See telemetry face sheet for immediately available MD    Location  [facility]    Staff Present  Russella Dar, MS, EP, [facility], Exercise Physiologist;S[place] Moss Zachery Conch, Exercise Physiologist    Virtual Visit  No    Medication changes reported      No    Fall or balance concerns reported     Yes    Comments  balance concerns    Tobacco Cessation  No Change    Warm-up and Cool-down  Performed as group-led instruction    Resistance Training Performed  Yes    VAD Patient?  No    PAD/SET Patient?  No      Pain Assessment   Currently in Pain?  No/denies    Pain Score  0-No pain    Multiple Pain Sites  No       Capillary Blood Glucose: No results found for this or any previous visit (from the past 24 hour(s)).    Social History   Tobacco Use  Smoking Status Never Smoker  Smokeless Tobacco Never Used    Goals Met:  Independence with exercise equipment Exercise tolerated well No report of cardiac concerns or symptoms Strength training completed today  Goals Unmet:  Not Applicable  Comments: Pt able to follow exercise prescription today without complaint.  Will continue to monitor for progression. Check out 0915.   Dr. Kate Sable is Medical Director for [facility] Cardiac and Pulmonary Rehab.

## 2019-07-19 ENCOUNTER — Other Ambulatory Visit: Payer: Self-pay

## 2019-07-19 ENCOUNTER — Encounter (HOSPITAL_COMMUNITY)
Admission: RE | Admit: 2019-07-19 | Discharge: 2019-07-19 | Disposition: A | Discharge status: Home / Self Care | Payer: Medicare Other | Source: Ambulatory Visit | Attending: Cardiothoracic Surgery | Admitting: Cardiothoracic Surgery

## 2019-07-19 DIAGNOSIS — Z952 Presence of prosthetic heart valve: Secondary | ICD-10-CM

## 2019-07-19 NOTE — Progress Notes (Signed)
Daily Session Note  Patient Details  Name: HASHIM EICHHORST MRN: [id number] Date of Birth: [birth year] Referring Provider:     CARDIAC REHAB PHASE II ORIENTATION from 01/13/2019 in [place]  Referring Provider  [name]      Encounter Date: 07/19/2019  Check In: Session Check In - 07/19/19 0815      Check-In   Supervising physician immediately available to respond to emergencies  See telemetry face sheet for immediately available MD    Location  [facility]    Staff Present  Russella Dar, MS, EP, [facility], Exercise Physiologist;Enna Warwick Zachery Conch, Exercise Physiologist    Virtual Visit  No    Medication changes reported      No    Fall or balance concerns reported     No    Tobacco Cessation  No Change    Warm-up and Cool-down  Performed as group-led instruction    Resistance Training Performed  Yes    VAD Patient?  No    PAD/SET Patient?  No      Pain Assessment   Currently in Pain?  No/denies    Pain Score  0-No pain    Multiple Pain Sites  No       Capillary Blood Glucose: No results found for this or any previous visit (from the past 24 hour(s)).    Social History   Tobacco Use  Smoking Status Never Smoker  Smokeless Tobacco Never Used    Goals Met:  Independence with exercise equipment Exercise tolerated well No report of cardiac concerns or symptoms Strength training completed today  Goals Unmet:  Not Applicable  Comments: Pt able to follow exercise prescription today without complaint.  Will continue to monitor for progression. Check out 0915.   Dr. Kate Sable is Medical Director for [facility] Cardiac and Pulmonary Rehab.

## 2019-07-21 ENCOUNTER — Other Ambulatory Visit: Payer: Self-pay

## 2019-07-21 ENCOUNTER — Encounter (HOSPITAL_COMMUNITY)
Admission: RE | Admit: 2019-07-21 | Discharge: 2019-07-21 | Disposition: A | Discharge status: Home / Self Care | Payer: Medicare Other | Source: Ambulatory Visit | Attending: Cardiothoracic Surgery | Admitting: Cardiothoracic Surgery

## 2019-07-21 DIAGNOSIS — Z952 Presence of prosthetic heart valve: Secondary | ICD-10-CM

## 2019-07-21 NOTE — Progress Notes (Signed)
Daily Session Note  Patient Details  Name: ALDON HENGST MRN: [id number] Date of Birth: 05-15-1929 Referring Provider:     CARDIAC REHAB PHASE II ORIENTATION from 01/13/2019 in [place]  Referring Provider  [name]      Encounter Date: 07/21/2019  Check In: Session Check In - 07/21/19 0846      Check-In   Supervising physician immediately available to respond to emergencies  See telemetry face sheet for immediately available MD    Location  [facility]    Staff Present  Russella Dar, MS, EP, [facility], Exercise Physiologist;Amanda Zachery Conch, Exercise Physiologist    Virtual Visit  No    Medication changes reported      No    Fall or balance concerns reported     Yes    Tobacco Cessation  No Change    Warm-up and Cool-down  Performed as group-led instruction    Resistance Training Performed  Yes    VAD Patient?  No    PAD/SET Patient?  No      Pain Assessment   Pain Score  1     Multiple Pain Sites  No       Capillary Blood Glucose: No results found for this or any previous visit (from the past 24 hour(s)).    Social History   Tobacco Use  Smoking Status Never Smoker  Smokeless Tobacco Never Used    Goals Met:  Independence with exercise equipment Exercise tolerated well Personal goals reviewed No report of cardiac concerns or symptoms Strength training completed today  Goals Unmet:  Not Applicable  Comments: Check out: 0915   Dr. Kate Sable is Medical Director for [place] and Pulmonary Rehab.

## 2019-07-24 ENCOUNTER — Other Ambulatory Visit: Payer: Self-pay

## 2019-07-24 ENCOUNTER — Encounter (HOSPITAL_COMMUNITY)
Admission: RE | Admit: 2019-07-24 | Discharge: 2019-07-24 | Disposition: A | Discharge status: Home / Self Care | Payer: Medicare Other | Source: Ambulatory Visit | Attending: Cardiothoracic Surgery | Admitting: Cardiothoracic Surgery

## 2019-07-24 DIAGNOSIS — Z952 Presence of prosthetic heart valve: Secondary | ICD-10-CM | POA: Diagnosis not present

## 2019-07-24 NOTE — Progress Notes (Signed)
Daily Session Note  Patient Details  Name: [name] MRN: [id number] Date of Birth: 11/09/1929 Referring Provider:     CARDIAC REHAB PHASE II ORIENTATION from 01/13/2019 in [place] CARDIAC REHABILITATION  Referring Provider  [name]      Encounter Date: 07/24/2019  Check In: Session Check In - 07/24/19 0815      Check-In   Supervising physician immediately available to respond to emergencies  See telemetry face sheet for immediately available MD    Location  [facility]    Staff Present  Amanda Ballard, Exercise Physiologist;Debra Johnson, RN, BSN    Virtual Visit  No    Medication changes reported      No    Fall or balance concerns reported     Yes    Tobacco Cessation  No Change    Warm-up and Cool-down  Performed as group-led instruction    Resistance Training Performed  Yes    VAD Patient?  No    PAD/SET Patient?  No      Pain Assessment   Currently in Pain?  No/denies    Pain Score  0-No pain    Multiple Pain Sites  No       Capillary Blood Glucose: No results found for this or any previous visit (from the past 24 hour(s)).    Social History   Tobacco Use  Smoking Status Never Smoker  Smokeless Tobacco Never Used    Goals Met:  Independence with exercise equipment Exercise tolerated well No report of cardiac concerns or symptoms Strength training completed today  Goals Unmet:  Not Applicable  Comments: Pt able to follow exercise prescription today without complaint.  Will continue to monitor for progression. Check out 0915.   Dr. Suresh Koneswaran is Medical Director for [facility] Cardiac and Pulmonary Rehab. 

## 2019-07-26 ENCOUNTER — Other Ambulatory Visit: Payer: Self-pay

## 2019-07-26 ENCOUNTER — Encounter (HOSPITAL_COMMUNITY)
Admission: RE | Admit: 2019-07-26 | Discharge: 2019-07-26 | Disposition: A | Discharge status: Home / Self Care | Payer: Medicare Other | Source: Ambulatory Visit | Attending: Cardiothoracic Surgery | Admitting: Cardiothoracic Surgery

## 2019-07-26 DIAGNOSIS — Z952 Presence of prosthetic heart valve: Secondary | ICD-10-CM

## 2019-07-26 NOTE — Progress Notes (Signed)
Daily Session Note  Patient Details  Name: [name] MRN: [id number] Date of Birth: 1929/07/18 Referring Provider:     CARDIAC REHAB PHASE II ORIENTATION from 01/13/2019 in [place]  Referring Provider  [name]      Encounter Date: 07/26/2019  Check In: Session Check In - 07/26/19 0815      Check-In   Supervising physician immediately available to respond to emergencies  See telemetry face sheet for immediately available MD    Location  [facility]    Staff Present  Benay Pike, Exercise Physiologist;Debra Wynetta Emery, RN, BSN    Virtual Visit  No    Medication changes reported      No    Fall or balance concerns reported     Yes    Tobacco Cessation  No Change    Warm-up and Cool-down  Performed as group-led instruction    Resistance Training Performed  Yes    VAD Patient?  No    PAD/SET Patient?  No      Pain Assessment   Currently in Pain?  No/denies    Pain Score  0-No pain    Multiple Pain Sites  No       Capillary Blood Glucose: No results found for this or any previous visit (from the past 24 hour(s)).    Social History   Tobacco Use  Smoking Status Never Smoker  Smokeless Tobacco Never Used    Goals Met:  Independence with exercise equipment Exercise tolerated well No report of cardiac concerns or symptoms Strength training completed today  Goals Unmet:  Not Applicable  Comments: Pt able to follow exercise prescription today without complaint.  Will continue to monitor for progression. Check out 0915.   Dr. Kate Sable is Medical Director for [facility] Cardiac and Pulmonary Rehab.

## 2019-07-28 ENCOUNTER — Encounter (HOSPITAL_COMMUNITY)
Admission: RE | Admit: 2019-07-28 | Discharge: 2019-07-28 | Disposition: A | Discharge status: Home / Self Care | Payer: Medicare Other | Source: Ambulatory Visit | Attending: Cardiothoracic Surgery | Admitting: Cardiothoracic Surgery

## 2019-07-28 ENCOUNTER — Other Ambulatory Visit: Payer: Self-pay

## 2019-07-28 DIAGNOSIS — Z952 Presence of prosthetic heart valve: Secondary | ICD-10-CM | POA: Diagnosis not present

## 2019-07-28 NOTE — Progress Notes (Signed)
Daily Session Note  Patient Details  Name: [name] MRN: [id number] Date of Birth: [birth year] Referring Provider:     CARDIAC REHAB PHASE II ORIENTATION from 01/13/2019 in [place]  Referring Provider  [name]      Encounter Date: 07/28/2019  Check In: Session Check In - 07/28/19 0831      Check-In   Supervising physician immediately available to respond to emergencies  See telemetry face sheet for immediately available MD    Location  [facility]    Staff Present  Douglas Dar, MS, EP, [facility], Exercise Physiologist;Douglas Wynetta Emery, RN, BSN    Virtual Visit  No    Medication changes reported      No    Fall or balance concerns reported     Yes    Tobacco Cessation  No Change    Resistance Training Performed  Yes    VAD Patient?  No    PAD/SET Patient?  No      Pain Assessment   Currently in Pain?  No/denies    Pain Score  0-No pain    Multiple Pain Sites  No       Capillary Blood Glucose: No results found for this or any previous visit (from the past 24 hour(s)).    Social History   Tobacco Use  Smoking Status Never Smoker  Smokeless Tobacco Never Used    Goals Met:  Independence with exercise equipment Exercise tolerated well Personal goals reviewed No report of cardiac concerns or symptoms Strength training completed today  Goals Unmet:  Not Applicable  Comments: Check out: 0915   Dr. Kate [name] is Medical Director for [place] and Pulmonary Rehab.

## 2019-07-31 ENCOUNTER — Encounter (HOSPITAL_COMMUNITY)
Admission: RE | Admit: 2019-07-31 | Discharge: 2019-07-31 | Disposition: A | Discharge status: Home / Self Care | Payer: Medicare Other | Source: Ambulatory Visit | Attending: Cardiothoracic Surgery | Admitting: Cardiothoracic Surgery

## 2019-07-31 ENCOUNTER — Other Ambulatory Visit: Payer: Self-pay

## 2019-07-31 VITALS — Ht 69.0 in | Wt 195.3 lb

## 2019-07-31 DIAGNOSIS — Z952 Presence of prosthetic heart valve: Secondary | ICD-10-CM

## 2019-07-31 NOTE — Progress Notes (Signed)
Daily Session Note  Patient Details  Name: [name] MRN: [id number] Date of Birth: 1929-09-19 Referring Provider:     CARDIAC REHAB [place] from 01/13/2019 in [place]  Referring Provider  [name]      Encounter Date: 07/31/2019  Check In: Session Check In - 07/31/19 0812      Check-In   Supervising physician immediately available to respond to emergencies  See telemetry face sheet for immediately available MD    Location  [facility]    Staff Present  Russella Dar, MS, EP, [facility], Exercise Physiologist;Cid Agena Wynetta Emery, RN, [facility], Exercise Physiologist    Virtual Visit  No    Medication changes reported      No    Fall or balance concerns reported     Yes    Comments  balance concerns    Tobacco Cessation  No Change    Warm-up and Cool-down  Performed as group-led instruction    Resistance Training Performed  Yes    VAD Patient?  No    PAD/SET Patient?  No      Pain Assessment   Currently in Pain?  No/denies    Pain Score  0-No pain    Multiple Pain Sites  No       Capillary Blood Glucose: No results found for this or any previous visit (from the past 24 hour(s)).    Social History   Tobacco Use  Smoking Status Never Smoker  Smokeless Tobacco Never Used    Goals Met:  Independence with exercise equipment Exercise tolerated well No report of cardiac concerns or symptoms Strength training completed today  Goals Unmet:  Not Applicable  Comments: Pt able to follow exercise prescription today without complaint.  Will continue to monitor for progression. Check out 1030.   Dr. Kate Sable is Medical Director for [facility] Cardiac and Pulmonary Rehab.

## 2019-08-02 ENCOUNTER — Encounter (HOSPITAL_COMMUNITY): Payer: Medicare Other

## 2019-08-03 NOTE — Progress Notes (Signed)
Cardiac Individual Treatment Plan  Patient Details  Name: [name] MRN: [id number] Date of Birth: [birth year] Referring Provider:     CARDIAC REHAB [name] from 01/13/2019 in [place]  Referring Provider  [place]      Initial Encounter Date:    CARDIAC REHAB PHASE II ORIENTATION from 01/13/2019 in [place]  Date  01/13/19      Visit Diagnosis: S/P TAVR (transcatheter aortic valve replacement)   Patient's Home Medications on Admission:  Current Outpatient Medications:  .  aspirin 81 MG tablet, Take 81 mg by mouth every evening. , Disp: , Rfl:  .  clopidogrel (PLAVIX) 75 MG tablet, Take 75 mg by mouth daily., Disp: , Rfl:  .  ferrous sulfate 325 (65 FE) MG EC tablet, Take 325 mg by mouth 3 (three) times daily with meals., Disp: , Rfl:  .  latanoprost (XALATAN) 0.005 % ophthalmic solution, Place 1 drop into both eyes at bedtime., Disp: , Rfl:  .  lisinopril (ZESTRIL) 20 MG tablet, Take 1 tablet (20 mg total) by mouth daily., Disp: 90 tablet, Rfl: 3 .  metFORMIN (GLUCOPHAGE) 500 MG tablet, Take 500 mg by mouth 2 (two) times daily with a meal., Disp: , Rfl:  .  pyridOXINE (VITAMIN B-6) 100 MG tablet, Take 100 mg by mouth daily., Disp: , Rfl:  .  simvastatin (ZOCOR) 20 MG tablet, Take 20 mg by mouth at bedtime., Disp: , Rfl:  .  vitamin B-12 (CYANOCOBALAMIN) 1000 MCG tablet, Take 1,000 mcg by mouth daily., Disp: , Rfl:   Past Medical History: Past Medical History:  Diagnosis Date  . Aortic stenosis    Status post TAVR at [facility], November 2019  . CAD (coronary artery disease)    DES to ostial RCA October 2019 at [facility]  . Essential hypertension   . Glaucoma   . Guillain-Barre syndrome (Cataract) 06/2013   Treated at [facility]  . History of nephrolithiasis   . History of pneumonia   . Hyperlipidemia   . Patent ductus arteriosus    Surgically repaired at age 31  . Prostate cancer ([name])   . Type 2 diabetes mellitus (HCC)      Tobacco Use: Social History   Tobacco Use  Smoking Status Never Smoker  Smokeless Tobacco Never Used    Labs: Recent Review Flowsheet Data    There is no flowsheet data to display.      Capillary Blood Glucose: Lab Results  Component Value Date   GLUCAP 134 (H) 11/03/2016   GLUCAP 110 (H) 10/13/2016   GLUCAP 134 (H) 06/12/2014   GLUCAP 135 (H) 09/11/2013   GLUCAP 124 (H) 09/08/2013     Exercise Target Goals: Exercise Program Goal: Individual exercise prescription set using results from initial 6 min walk test and THRR while considering  patient's activity barriers and safety.   Exercise Prescription Goal: Starting with aerobic activity 30 plus minutes a day, 3 days per week for initial exercise prescription. Provide home exercise prescription and guidelines that participant acknowledges understanding prior to discharge.  Activity Barriers & Risk Stratification:   6 Minute Walk: 6 Minute Walk    Row Name 07/31/19 1235         6 Minute Walk   Phase  Discharge     Distance  710 feet     Distance % Change  7 %     Distance Feet Change  60 ft     Walk Time  6 minutes     #  of Rest Breaks  0     MPH  1.34     METS  2.03     RPE  13     Perceived Dyspnea   10     VO2 Peak  4.58     Symptoms  No     Resting HR  100 bpm     Resting BP  148/68     Resting Oxygen Saturation   97 %     Exercise Oxygen Saturation  during 6 min walk  93 %     Max Ex. HR  2.33 bpm     Max Ex. BP  168/72     2 Minute Post BP  150/68        Oxygen Initial Assessment:   Oxygen Re-Evaluation:   Oxygen Discharge (Final Oxygen Re-Evaluation):   Initial Exercise Prescription:   Perform Capillary Blood Glucose checks as needed.  Exercise Prescription Changes:  Exercise Prescription Changes    Row Name 02/14/19 0800 02/28/19 0700 03/13/19 0900 07/06/19 1200       Response to Exercise   Blood Pressure (Admit)  138/60  164/50  120/58  140/50    Blood Pressure  (Exercise)  168/62  180/70  190/70  158/50    Blood Pressure (Exit)  128/52  150/74  148/60  128/50    Heart Rate (Admit)  78 bpm  77 bpm  80 bpm  96 bpm    Heart Rate (Exercise)  119 bpm  113 bpm  120 bpm  111 bpm    Heart Rate (Exit)  87 bpm  86 bpm  88 bpm  102 bpm    Rating of Perceived Exertion (Exercise)  _0 Comments  -  increase in overall MET level   increase in overall MET level   first two weeks back     Duration  Continue with 30 min of aerobic exercise without signs/symptoms of physical distress.  Continue with 30 min of aerobic exercise without signs/symptoms of physical distress.  Continue with 30 min of aerobic exercise without signs/symptoms of physical distress.  Continue with 30 min of aerobic exercise without signs/symptoms of physical distress.    Intensity  THRR unchanged  THRR unchanged  THRR unchanged  THRR unchanged      Progression   Progression  Continue to progress workloads to maintain intensity without signs/symptoms of physical distress.  Continue to progress workloads to maintain intensity without signs/symptoms of physical distress.  Continue to progress workloads to maintain intensity without signs/symptoms of physical distress.  Continue to progress workloads to maintain intensity without signs/symptoms of physical distress.    Average METs  2.55  2.95  3.2  2.1      Resistance Training   Training Prescription  Yes  Yes  Yes  Yes    Weight  _1 Reps  10-15  10-15  10-15  10-15      NuStep   Level  _2 SPM  98  104  101  100    Minutes  _3 METs  2.4  2.8  3.2  2      Arm Ergometer   Level  _4 Watts  _5 10  RPM  59  56  59  52    Minutes  _0 METs  2.7  3.1  3.2  2.2      Home Exercise Plan   Plans to continue exercise at  Home (comment)  Home (comment)  Home (comment)  Home (comment)    Frequency  Add 2 additional days to program exercise sessions.  Add 2 additional  days to program exercise sessions.  Add 2 additional days to program exercise sessions.  Add 2 additional days to program exercise sessions.    Initial Home Exercises Provided  01/13/19  01/13/19  01/13/19  01/13/19       Exercise Comments:  Exercise Comments    Row Name 02/21/19 1610 03/13/19 0946 07/06/19 1227 07/10/19 1336     Exercise Comments  Pt. has attended 17 sessions in the  program. He is consistant which has helped him to get in a routine as well as see progress in his strength. He states he feels stronger since beginning the program. He would still like to improve his balance and mobility the best he can.   Pt. is doing great in CR. He is eager to get here everyday to work hard. He wantes to stay active outside of here and states he is feeling more comfortable to do so.   Pt. has returned after being out due to [place] and high blood pressure. He has definitley lost some strength and stamina over this period. We will continue to monitor him and aid in the regaining of his strength.  Pt. has returned since being out due to [place] and then his high blood pressure. He is always eager and ready to exercise. He is unable to do a lot but tolerates the Arm Ergometer and Nustep well. He wants to stay as active as possible so that he doesn't lose anymore of his mobility and strength.       Exercise Goals and Review:   Exercise Goals Re-Evaluation : Exercise Goals Re-Evaluation    Row Name 02/21/19 0741 03/13/19 0943 07/10/19 1333         Exercise Goal Re-Evaluation   Exercise Goals Review  Increase Physical Activity;Increase Strength and Stamina;Able to understand and use rate of perceived exertion (RPE) scale;Knowledge and understanding of Target Heart Rate Range (THRR);Able to check pulse independently;Understanding of Exercise Prescription  Increase Physical Activity;Increase Strength and Stamina;Able to understand and use rate of perceived exertion (RPE) scale;Knowledge and understanding  of Target Heart Rate Range (THRR);Able to check pulse independently;Understanding of Exercise Prescription  Increase Physical Activity;Increase Strength and Stamina;Able to understand and use rate of perceived exertion (RPE) scale;Knowledge and understanding of Target Heart Rate Range (THRR);Able to check pulse independently;Understanding of Exercise Prescription     Comments  Pt. is doing great in the program. He is tolerating all the exercise well and has shown his ability to work hard to increase his overall MET level every session.  Pt. continues to do well in the program. He has attended 26 sessions so far and continues to push himself everyday.   Pt. has returned to Cardiac Rehab after getting his blood pressure under control. He is always happy to be here and works hard to maintain his mobility.     Expected Outcomes  improve balance and mobility to stay active.   improve balance and mobility to stay active.   Short: get back in to an exercise routine. Long: improve mobility  Discharge Exercise Prescription (Final Exercise Prescription Changes): Exercise Prescription Changes - 07/06/19 1200      Response to Exercise   Blood Pressure (Admit)  140/50    Blood Pressure (Exercise)  158/50    Blood Pressure (Exit)  128/50    Heart Rate (Admit)  96 bpm    Heart Rate (Exercise)  111 bpm    Heart Rate (Exit)  102 bpm    Rating of Perceived Exertion (Exercise)  11    Comments  first two weeks back     Duration  Continue with 30 min of aerobic exercise without signs/symptoms of physical distress.    Intensity  THRR unchanged      Progression   Progression  Continue to progress workloads to maintain intensity without signs/symptoms of physical distress.    Average METs  2.1      Resistance Training   Training Prescription  Yes    Weight  2    Reps  10-15      NuStep   Level  2    SPM  100    Minutes  17    METs  2      Arm Ergometer   Level  1    Watts  10    RPM  52     Minutes  22    METs  2.2      Home Exercise Plan   Plans to continue exercise at  Home (comment)    Frequency  Add 2 additional days to program exercise sessions.    Initial Home Exercises Provided  01/13/19       Nutrition:  Target Goals: Understanding of nutrition guidelines, daily intake of sodium <1562m, cholesterol <2037m calories 30% from fat and 7% or less from saturated fats, daily to have 5 or more servings of fruits and vegetables.  Biometrics:  Post Biometrics - 07/31/19 1237       Post  Biometrics   Height  5' 9" (1.753 m)    Weight  88.6 kg    Waist Circumference  41.5 inches    Hip Circumference  41 inches    Waist to Hip Ratio  1.01 %    BMI (Calculated)  28.83    Triceps Skinfold  9 mm    % Body Fat  27.2 %    Grip Strength  6.9 kg       Nutrition Therapy Plan and Nutrition Goals: Nutrition Therapy & Goals - 07/10/19 1528      Nutrition Therapy   RD appointment deferred  Yes      Intervention Plan   Intervention  Nutrition handout(s) given to patient.       Nutrition Assessments: Nutrition Assessments - 07/25/19 1449      MEDFICTS Scores   Pre Score  72    Post Score  45    Score Difference  -27       Nutrition Goals Re-Evaluation:   Nutrition Goals Discharge (Final Nutrition Goals Re-Evaluation):   Psychosocial: Target Goals: Acknowledge presence or absence of significant depression and/or stress, maximize coping skills, provide positive support system. Participant is able to verbalize types and ability to use techniques and skills needed for reducing stress and depression.  Initial Review & Psychosocial Screening:   Quality of Life Scores: Quality of Life - 07/31/19 1242      Quality of Life   Select  Quality of Life      Quality of Life Scores   Health/Function  Pre  18.47 %    Health/Function Post  21.5 %    Health/Function % Change  16.4 %    Socioeconomic Pre  24.83 %    Socioeconomic Post  20.17 %    Socioeconomic %  Change   -18.77 %    Psych/Spiritual Pre  25.29 %    Psych/Spiritual Post  26.75 %    Psych/Spiritual % Change  5.77 %    Family Pre  19.5 %    Family Post  18.63 %    Family % Change  -4.46 %    GLOBAL Pre  21.28 %    GLOBAL Post  21.89 %    GLOBAL % Change  2.87 %      Scores of 19 and below usually indicate a poorer quality of life in these areas.  A difference of  2-3 points is a clinically meaningful difference.  A difference of 2-3 points in the total score of the Quality of Life Index has been associated with significant improvement in overall quality of life, self-image, physical symptoms, and general health in studies assessing change in quality of life.  PHQ-9: Recent Review Flowsheet Data    Depression screen [facility] 2/9 07/25/2019 01/13/2019   Decreased Interest 0 0   Down, Depressed, Hopeless 0 0   PHQ - 2 Score 0 0   Altered sleeping 1 0   Tired, decreased energy 1 2   Change in appetite 0 0   Feeling bad or failure about yourself  0 0   Trouble concentrating 0 0   Moving slowly or fidgety/restless 0 0   Suicidal thoughts 0 0   PHQ-9 Score 2 2   Difficult doing work/chores Not difficult at all Not difficult at all     Interpretation of Total Score  Total Score Depression Severity:  1-4 = Minimal depression, 5-9 = Mild depression, 10-14 = Moderate depression, 15-19 = Moderately severe depression, 20-27 = Severe depression   Psychosocial Evaluation and Intervention: Psychosocial Evaluation - 08/03/19 1500      Discharge Psychosocial Assessment & Intervention   Comments  Patient has no psychosocial issues identified at discharge. His exit QOL score improved by 2.85% at 21.28% and his PHQ-9 score remained the same at 2.       Psychosocial Re-Evaluation: Psychosocial Re-Evaluation    [place] Name 02/22/19 1454 03/13/19 1104 07/10/19 1534         Psychosocial Re-Evaluation   Current issues with  None Identified  None Identified  -     Comments  Patient's initial QOL  score was 21.28 and his PHQ_9 score was 1 with no pyschosocial issues identified. Will continue to monitor for progress.   Patient's initial QOL score was 21.28 and his PHQ_9 score was 1 with no pyschosocial issues identified. Will continue to monitor for progress.   Patient's initial QOL score was 21.28 and his PHQ_9 score was 1 with no pyschosocial issues identified. Will continue to monitor for progress.      Expected Outcomes  Patient will have no psychosocial issues identified at discharge.   Patient will have no psychosocial issues identified at discharge.   Patient will have no psychosocial issues identified at discharge.      Interventions  Stress management education;Relaxation education;Encouraged to attend Cardiac Rehabilitation for the exercise  Stress management education;Relaxation education;Encouraged to attend Cardiac Rehabilitation for the exercise  Stress management education;Relaxation education;Encouraged to attend Cardiac Rehabilitation for the exercise     Continue Psychosocial Services  No Follow up required  No Follow up required  -        Psychosocial Discharge (Final Psychosocial Re-Evaluation): Psychosocial Re-Evaluation - 07/10/19 1534      Psychosocial Re-Evaluation   Comments  Patient's initial QOL score was 21.28 and his PHQ_9 score was 1 with no pyschosocial issues identified. Will continue to monitor for progress.     Expected Outcomes  Patient will have no psychosocial issues identified at discharge.     Interventions  Stress management education;Relaxation education;Encouraged to attend Cardiac Rehabilitation for the exercise       Vocational Rehabilitation: Provide vocational rehab assistance to qualifying candidates.   Vocational Rehab Evaluation & Intervention:   Education: Education Goals: Education classes will be provided on a weekly basis, covering required topics. Participant will state understanding/return demonstration of topics  presented.  Learning Barriers/Preferences:   Education Topics: Hypertension, Hypertension Reduction -Define heart disease and high blood pressure. Discus how high blood pressure affects the body and ways to reduce high blood pressure.   CARDIAC REHAB PHASE II EXERCISE from 07/19/2019 in [place]  Date  01/25/19  Educator  DJ  Instruction Review Code  2- Demonstrated Understanding      Exercise and Your Heart -Discuss why it is important to exercise, the FITT principles of exercise, normal and abnormal responses to exercise, and how to exercise safely.   CARDIAC REHAB PHASE II EXERCISE from 07/19/2019 in [place]  Date  02/01/19  Educator  Wynetta Emery  Instruction Review Code  2- Demonstrated Understanding      Angina -Discuss definition of angina, causes of angina, treatment of angina, and how to decrease risk of having angina.   CARDIAC REHAB PHASE II EXERCISE from 07/19/2019 in [place]  Date  02/08/19  Educator  Wynetta Emery  Instruction Review Code  2- Demonstrated Understanding      Cardiac Medications -Review what the following cardiac medications are used for, how they affect the body, and side effects that may occur when taking the medications.  Medications include Aspirin, Beta blockers, calcium channel blockers, ACE Inhibitors, angiotensin receptor blockers, diuretics, digoxin, and antihyperlipidemics.   CARDIAC REHAB PHASE II EXERCISE from 07/19/2019 in [place]  Date  02/15/19  Educator  Wynetta Emery  Instruction Review Code  2- Demonstrated Understanding      Congestive Heart Failure -Discuss the definition of CHF, how to live with CHF, the signs and symptoms of CHF, and how keep track of weight and sodium intake.   CARDIAC REHAB PHASE II EXERCISE from 07/19/2019 in [place]  Date  02/22/19  Educator  Wynetta Emery  Instruction Review Code  2- Demonstrated  Understanding      Heart Disease and Intimacy -Discus the effect sexual activity has on the heart, how changes occur during intimacy as we age, and safety during sexual activity.   CARDIAC REHAB PHASE II EXERCISE from 07/19/2019 in [place]  Date  03/01/19  Educator  Wynetta Emery  Instruction Review Code  2- Demonstrated Understanding      Smoking Cessation / COPD -Discuss different methods to quit smoking, the health benefits of quitting smoking, and the definition of COPD.   Nutrition I: Fats -Discuss the types of cholesterol, what cholesterol does to the heart, and how cholesterol levels can be controlled.   Nutrition II: Labels -Discuss the different components of food labels and how to read food label   Heart Parts/Heart Disease and PAD -Discuss  the anatomy of the heart, the pathway of blood circulation through the heart, and these are affected by heart disease.   Stress I: Signs and Symptoms -Discuss the causes of stress, how stress may lead to anxiety and depression, and ways to limit stress.   Stress II: Relaxation -Discuss different types of relaxation techniques to limit stress.   Warning Signs of Stroke / TIA -Discuss definition of a stroke, what the signs and symptoms are of a stroke, and how to identify when someone is having stroke.   CARDIAC REHAB PHASE II EXERCISE from 07/19/2019 in [place]  Date  01/18/19  Educator  DJ  Instruction Review Code  2- Demonstrated Understanding      Knowledge Questionnaire Score: Knowledge Questionnaire Score - 07/25/19 1449      Knowledge Questionnaire Score   Pre Score  19/24    Post Score  17/24       Core Components/Risk Factors/Patient Goals at Admission:   Core Components/Risk Factors/Patient Goals Review:  Goals and Risk Factor Review    Row Name 02/22/19 1448 03/13/19 1059 07/10/19 1529 08/03/19 1502       Core Components/Risk Factors/Patient Goals Review    Personal Goals Review  Hypertension;Diabetes;Weight Management/Obesity Improve mobility; stay active.   Hypertension;Diabetes;Weight Management/Obesity Improve mobility; stay active.   Hypertension;Diabetes;Weight Management/Obesity Improve mobility; stay active.  Hypertension;Diabetes;Weight Management/Obesity Help mobility; stay active.    Review  Patient has completed 18 session gaining 1 lb since last 30 day review. He is doing well in the program with progression. 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 His last A1C was in February 2020 at 5.11m/dl. His reported fasting glucose readings average 130 mg/dl. Will continue to monitor for progress.   Patient has completed 23 sessions maintaining his weight since last 30 day review. He continues to do well in the program with progression. He continues to say he feels stronger and has more energy. He has had no A1C's since last 30 day review. His reported fasting glucose readings are averaging 130 mg/dl. His pcp added his Lisonopril 10 mg daily on 02/15/19 with no improvement in his blood pressure. Staff reached out to cardiology to make him an appointment for tighter blood pressue control. Outpatient cardiac rehab services have been suspended due to the COVID-19 restrictions. Will continue to monitor.   Cardiac Rehab closed 03/06/19 and reopened 06/19/19. Patient did return to the program losing 6 lbs since March. His blood pressue was elevated the first day he returned and he was not allowed to exercise. He saw Dr. MDomenic Polite7/6/20 and he increased his Lisinopril from 10 mg to 20 mg daily. His blood pressue has improved but still hypertensive at times. He is keeping a log for the MD. He says coming to the program helps him stay active. He demonstrates a more steady gait today than 3 weeks ago. He says his mobility has improved some. He does feel the program is benefiting  him. His reported glucose readings are averaging 130 mg/dl. Will continue to monitor for progress.  Patient graduated with 36 session losing 4 lbs overall. He did very well in the program. His exit measurements improved in grip strength. His exit walk test did not improve by decreased by 7%. His medficts score improved by 62%. His blood pressue was controlled at graduation after his cardiologist made medication adjustments. Patient states he feels better overall  and does feel stronger by completing this program. He also states that he learned a lot and heart disease and eating better. He continues to struggle with bilateral lower extremity weakness due to his h/o guillain barre. His reported glucose readings are usually WNL. We have no A1C on file. He says he hopes to stay [place]. CR will f/u for one year.    Expected Outcomes  Patient will continue to attend sessions and complete the program meeting his personal goals.   Patient will continue to attend sessions and complete the program meeting his personal goals.   Patient will continue to attend sessions and complete the program meeting his personal goals.   Patient will continue to stay active by [place] and continue to meet his personal goals.       Core Components/Risk Factors/Patient Goals at Discharge (Final Review):  Goals and Risk Factor Review - 08/03/19 1502      Core Components/Risk Factors/Patient Goals Review   Personal Goals Review  Hypertension;Diabetes;Weight Management/Obesity   Help mobility; stay active.   Review  Patient graduated with 36 session losing 4 lbs overall. He did very well in the program. His exit measurements improved in grip strength. His exit walk test did not improve by decreased by 7%. His medficts score improved by 62%. His blood pressue was controlled at graduation after his cardiologist made medication adjustments. Patient states he feels better overall and does feel stronger by  completing this program. He also states that he learned a lot and heart disease and eating better. He continues to struggle with bilateral lower extremity weakness due to his h/o guillain barre. His reported glucose readings are usually WNL. We have no A1C on file. He says he hopes to stay active by [place]. CR will f/u for one year.    Expected Outcomes  Patient will continue to stay active by [place]king [place] his [place] and continue to meet his personal goals.       ITP Comments: ITP Comments    Row Name 03/10/19 1308           ITP Comments  Cardiac Rehab services have been suspended due to the COVID-19 restrictions starting 03/06/2019. Patient will restart the progam when restrictions have been lifted.          Comments: Patient graduated from [place] today on 07/31/19 after completing 36 sessions. He achieved LTG of 30 minutes of aerobic exercise at Max Met level of 3.4. All patients vitals are WNL. Patient has met with dietician. Discharge instruction has been reviewed in detail and patient stated an understanding of material given. Patient plans to exercise at home by [place]. Cardiac Rehab staff will make f/u calls at 1 month, 6 months, and 1 year. Patient had no complaints of any abnormal S/S or pain on their exit visit.

## 2019-08-03 NOTE — Progress Notes (Signed)
Discharge Progress Report  Patient Details  Name: [name] MRN: [id number] Date of Birth: 07/24/1929 Referring Provider:     [place] from 01/13/2019 in [place]  Referring Provider  [place]       Number of Visits: 14  Reason for Discharge:  Patient reached a stable level of exercise. Patient independent in their exercise. Patient has met program and personal goals.  Smoking History:  Social History   Tobacco Use  Smoking Status Never Smoker  Smokeless Tobacco Never Used    Diagnosis:  S/P TAVR (transcatheter aortic valve replacement)  ADL UCSD:   Initial Exercise Prescription:   Discharge Exercise Prescription (Final Exercise Prescription Changes): Exercise Prescription Changes - 07/06/19 1200      Response to Exercise   Blood Pressure (Admit)  140/50    Blood Pressure (Exercise)  158/50    Blood Pressure (Exit)  128/50    Heart Rate (Admit)  96 bpm    Heart Rate (Exercise)  111 bpm    Heart Rate (Exit)  102 bpm    Rating of Perceived Exertion (Exercise)  11    Comments  first two weeks back     Duration  Continue with 30 min of aerobic exercise without signs/symptoms of physical distress.    Intensity  THRR unchanged      Progression   Progression  Continue to progress workloads to maintain intensity without signs/symptoms of physical distress.    Average METs  2.1      Resistance Training   Training Prescription  Yes    Weight  2    Reps  10-15      NuStep   Level  2    SPM  100    Minutes  17    METs  2      Arm Ergometer   Level  1    Watts  10    RPM  52    Minutes  22    METs  2.2      Home Exercise Plan   Plans to continue exercise at  Home (comment)    Frequency  Add 2 additional days to program exercise sessions.    Initial Home Exercises Provided  01/13/19       Functional Capacity: 6 Minute Walk    Row Name 07/31/19 1235         6 Minute Walk   Phase  Discharge      Distance  710 feet     Distance % Change  7 %     Distance Feet Change  60 ft     Walk Time  6 minutes     # of Rest Breaks  0     MPH  1.34     METS  2.03     RPE  13     Perceived Dyspnea   10     VO2 Peak  4.58     Symptoms  No     Resting HR  100 bpm     Resting BP  148/68     Resting Oxygen Saturation   97 %     Exercise Oxygen Saturation  during 6 min walk  93 %     Max Ex. HR  2.33 bpm     Max Ex. BP  168/72     2 Minute Post BP  150/68        Psychological, QOL, Others - Outcomes:  PHQ 2/9: Depression screen [facility] 2/9 07/25/2019 01/13/2019  Decreased Interest 0 0  Down, Depressed, Hopeless 0 0  PHQ - 2 Score 0 0  Altered sleeping 1 0  Tired, decreased energy 1 2  Change in appetite 0 0  Feeling bad or failure about yourself  0 0  Trouble concentrating 0 0  Moving slowly or fidgety/restless 0 0  Suicidal thoughts 0 0  PHQ-9 Score 2 2  Difficult doing work/chores Not difficult at all Not difficult at all    Quality of Life: Quality of Life - 07/31/19 1242      Quality of Life   Select  Quality of Life      Quality of Life Scores   Health/Function Pre  18.47 %    Health/Function Post  21.5 %    Health/Function % Change  16.4 %    Socioeconomic Pre  24.83 %    Socioeconomic Post  20.17 %    Socioeconomic % Change   -18.77 %    Psych/Spiritual Pre  25.29 %    Psych/Spiritual Post  26.75 %    Psych/Spiritual % Change  5.77 %    Family Pre  19.5 %    Family Post  18.63 %    Family % Change  -4.46 %    GLOBAL Pre  21.28 %    GLOBAL Post  21.89 %    GLOBAL % Change  2.87 %       Personal Goals: Goals established at orientation with interventions provided to work toward goal.    Personal Goals Discharge: Goals and Risk Factor Review    Row Name 02/22/19 1448 03/13/19 1059 07/10/19 1529 08/03/19 1502       Core Components/Risk Factors/Patient Goals Review   Personal Goals Review  Hypertension;Diabetes;Weight Management/Obesity Improve mobility; stay  active.   Hypertension;Diabetes;Weight Management/Obesity Improve mobility; stay active.   Hypertension;Diabetes;Weight Management/Obesity Improve mobility; stay active.  Hypertension;Diabetes;Weight Management/Obesity Help mobility; stay active.    Review  Patient has completed 18 session gaining 1 lb since last 30 day review. He is doing well in the program with progression. 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 His last A1C was in February 2020 at 5.56m/dl. His reported fasting glucose readings average 130 mg/dl. Will continue to monitor for progress.   Patient has completed 23 sessions maintaining his weight since last 30 day review. He continues to do well in the program with progression. He continues to say he feels stronger and has more energy. He has had no A1C's since last 30 day review. His reported fasting glucose readings are averaging 130 mg/dl. His pcp added his Lisonopril 10 mg daily on 02/15/19 with no improvement in his blood pressure. Staff reached out to cardiology to make him an appointment for tighter blood pressue control. Outpatient cardiac rehab services have been suspended due to the COVID-19 restrictions. Will continue to monitor.   Cardiac Rehab closed 03/06/19 and reopened 06/19/19. Patient did return to the program losing 6 lbs since March. His blood pressue was elevated the first day he returned and he was not allowed to exercise. He saw Dr. MDomenic Polite7/6/20 and he increased his Lisinopril from 10 mg to 20 mg daily. His blood pressue has improved but still hypertensive at times. He is keeping a log for the MD. He says coming to the program helps him stay active. He demonstrates a  more steady gait today than 3 weeks ago. He says his mobility has improved some. He does feel the program is benefiting him. His reported glucose readings are averaging 130 mg/dl. Will continue to monitor for progress.   Patient graduated with 36 session losing 4 lbs overall. He did very well in the program. His exit measurements improved in grip strength. His exit walk test did not improve by decreased by 7%. His medficts score improved by 62%. His blood pressue was controlled at graduation after his cardiologist made medication adjustments. Patient states he feels better overall and does feel stronger by completing this program. He also states that he learned a lot and heart disease and eating better. He continues to struggle with bilateral lower extremity weakness due to his h/o guillain barre. His reported glucose readings are usually WNL. We have no A1C on file. He says he hopes to stay active [place]. CR will f/u for one year.    Expected Outcomes  Patient will continue to attend sessions and complete the program meeting his personal goals.   Patient will continue to attend sessions and complete the program meeting his personal goals.   Patient will continue to attend sessions and complete the program meeting his personal goals.   Patient will continue to stay active by [place] his [place] and continue to meet his personal goals.       Exercise Goals and Review:   Exercise Goals Re-Evaluation: Exercise Goals Re-Evaluation    Row Name 02/21/19 0741 03/13/19 0943 07/10/19 1333         Exercise Goal Re-Evaluation   Exercise Goals Review  Increase Physical Activity;Increase Strength and Stamina;Able to understand and use rate of perceived exertion (RPE) scale;Knowledge and understanding of Target Heart Rate Range (THRR);Able to check pulse independently;Understanding of Exercise Prescription  Increase Physical Activity;Increase Strength and Stamina;Able to understand and use rate of perceived exertion (RPE) scale;Knowledge and understanding of Target Heart Rate Range (THRR);Able to check pulse independently;Understanding of Exercise Prescription  Increase Physical Activity;Increase Strength and  Stamina;Able to understand and use rate of perceived exertion (RPE) scale;Knowledge and understanding of Target Heart Rate Range (THRR);Able to check pulse independently;Understanding of Exercise Prescription     Comments  Pt. is doing great in the program. He is tolerating all the exercise well and has shown his ability to work hard to increase his overall MET level every session.  Pt. continues to do well in the program. He has attended 26 sessions so far and continues to push himself everyday.   Pt. has returned to Cardiac Rehab after getting his blood pressure under control. He is always happy to be here and works hard to maintain his mobility.     Expected Outcomes  improve balance and mobility to stay active.   improve balance and mobility to stay active.   Short: get back in to an exercise routine. Long: improve mobility        Nutrition & Weight - Outcomes:  Post Biometrics - 07/31/19 1237       Post  Biometrics   Height  _0  (1.753 m)    Weight  88.6 kg    Waist Circumference  41.5 inches    Hip Circumference  41 inches    Waist to Hip Ratio  1.01 %    BMI (Calculated)  28.83    Triceps Skinfold  9 mm    % Body Fat  27.2 %    Grip Strength  6.9 kg       Nutrition: Nutrition Therapy & Goals - 07/10/19 1528      Nutrition Therapy   RD appointment deferred  Yes      Intervention Plan   Intervention  Nutrition handout(s) given to patient.       Nutrition Discharge: Nutrition Assessments - 07/25/19 1449      MEDFICTS Scores   Pre Score  72    Post Score  45    Score Difference  -27       Education Questionnaire Score: Knowledge Questionnaire Score - 07/25/19 1449      Knowledge Questionnaire Score   Pre Score  19/24    Post Score  17/24       Goals reviewed with patient; copy given to patient.

## 2019-08-04 ENCOUNTER — Encounter (HOSPITAL_COMMUNITY): Payer: Medicare Other

## 2019-08-21 DIAGNOSIS — E1140 Type 2 diabetes mellitus with diabetic neuropathy, unspecified: Secondary | ICD-10-CM | POA: Diagnosis not present

## 2019-08-21 DIAGNOSIS — B351 Tinea unguium: Secondary | ICD-10-CM | POA: Diagnosis not present

## 2019-08-23 DIAGNOSIS — I10 Essential (primary) hypertension: Secondary | ICD-10-CM | POA: Diagnosis not present

## 2019-08-23 DIAGNOSIS — Z125 Encounter for screening for malignant neoplasm of prostate: Secondary | ICD-10-CM | POA: Diagnosis not present

## 2019-08-23 DIAGNOSIS — E1140 Type 2 diabetes mellitus with diabetic neuropathy, unspecified: Secondary | ICD-10-CM | POA: Diagnosis not present

## 2019-08-23 DIAGNOSIS — C61 Malignant neoplasm of prostate: Secondary | ICD-10-CM | POA: Diagnosis not present

## 2019-08-23 DIAGNOSIS — Z79899 Other long term (current) drug therapy: Secondary | ICD-10-CM | POA: Diagnosis not present

## 2019-08-23 DIAGNOSIS — I350 Nonrheumatic aortic (valve) stenosis: Secondary | ICD-10-CM | POA: Diagnosis not present

## 2019-08-23 DIAGNOSIS — D508 Other iron deficiency anemias: Secondary | ICD-10-CM | POA: Diagnosis not present

## 2019-08-30 DIAGNOSIS — I10 Essential (primary) hypertension: Secondary | ICD-10-CM | POA: Diagnosis not present

## 2019-08-30 DIAGNOSIS — Z8546 Personal history of malignant neoplasm of prostate: Secondary | ICD-10-CM | POA: Diagnosis not present

## 2019-08-30 DIAGNOSIS — E1140 Type 2 diabetes mellitus with diabetic neuropathy, unspecified: Secondary | ICD-10-CM | POA: Diagnosis not present

## 2019-08-30 DIAGNOSIS — E785 Hyperlipidemia, unspecified: Secondary | ICD-10-CM | POA: Diagnosis not present

## 2019-10-11 ENCOUNTER — Other Ambulatory Visit: Payer: Self-pay | Admitting: *Deleted

## 2019-10-11 DIAGNOSIS — Z20822 Contact with and (suspected) exposure to covid-19: Secondary | ICD-10-CM

## 2019-10-12 ENCOUNTER — Telehealth: Payer: Self-pay | Admitting: General Practice

## 2019-10-12 LAB — NOVEL CORONAVIRUS, NAA: SARS-CoV-2, NAA: NOT DETECTED

## 2019-10-12 NOTE — Telephone Encounter (Signed)
Pt called in for covid result.  °Advised of Not Detected result.  °

## 2019-10-13 ENCOUNTER — Other Ambulatory Visit: Payer: Self-pay

## 2019-10-13 DIAGNOSIS — Z20822 Contact with and (suspected) exposure to covid-19: Secondary | ICD-10-CM

## 2019-10-15 LAB — NOVEL CORONAVIRUS, NAA: SARS-CoV-2, NAA: DETECTED — AB

## 2019-11-02 DIAGNOSIS — H401132 Primary open-angle glaucoma, bilateral, moderate stage: Secondary | ICD-10-CM | POA: Diagnosis not present

## 2019-11-02 DIAGNOSIS — E119 Type 2 diabetes mellitus without complications: Secondary | ICD-10-CM | POA: Diagnosis not present

## 2019-11-02 DIAGNOSIS — H26491 Other secondary cataract, right eye: Secondary | ICD-10-CM | POA: Diagnosis not present

## 2019-11-02 DIAGNOSIS — Z961 Presence of intraocular lens: Secondary | ICD-10-CM | POA: Diagnosis not present

## 2019-11-08 DIAGNOSIS — H26491 Other secondary cataract, right eye: Secondary | ICD-10-CM | POA: Diagnosis not present

## 2019-11-13 DIAGNOSIS — Z23 Encounter for immunization: Secondary | ICD-10-CM | POA: Diagnosis not present

## 2019-11-20 DIAGNOSIS — E1140 Type 2 diabetes mellitus with diabetic neuropathy, unspecified: Secondary | ICD-10-CM | POA: Diagnosis not present

## 2019-11-20 DIAGNOSIS — B351 Tinea unguium: Secondary | ICD-10-CM | POA: Diagnosis not present

## 2019-11-27 DIAGNOSIS — Z952 Presence of prosthetic heart valve: Secondary | ICD-10-CM | POA: Diagnosis not present

## 2019-11-27 DIAGNOSIS — E782 Mixed hyperlipidemia: Secondary | ICD-10-CM | POA: Diagnosis not present

## 2019-11-27 DIAGNOSIS — I2583 Coronary atherosclerosis due to lipid rich plaque: Secondary | ICD-10-CM | POA: Diagnosis not present

## 2019-11-27 DIAGNOSIS — I440 Atrioventricular block, first degree: Secondary | ICD-10-CM | POA: Diagnosis not present

## 2019-11-27 DIAGNOSIS — I119 Hypertensive heart disease without heart failure: Secondary | ICD-10-CM | POA: Diagnosis not present

## 2019-11-27 DIAGNOSIS — Z8774 Personal history of (corrected) congenital malformations of heart and circulatory system: Secondary | ICD-10-CM | POA: Diagnosis not present

## 2019-11-27 DIAGNOSIS — E119 Type 2 diabetes mellitus without complications: Secondary | ICD-10-CM | POA: Diagnosis not present

## 2019-11-27 DIAGNOSIS — E785 Hyperlipidemia, unspecified: Secondary | ICD-10-CM | POA: Diagnosis not present

## 2019-11-27 DIAGNOSIS — Z48812 Encounter for surgical aftercare following surgery on the circulatory system: Secondary | ICD-10-CM | POA: Diagnosis not present

## 2019-11-27 DIAGNOSIS — I082 Rheumatic disorders of both aortic and tricuspid valves: Secondary | ICD-10-CM | POA: Diagnosis not present

## 2019-11-27 DIAGNOSIS — I2510 Atherosclerotic heart disease of native coronary artery without angina pectoris: Secondary | ICD-10-CM | POA: Diagnosis not present

## 2019-11-27 DIAGNOSIS — I10 Essential (primary) hypertension: Secondary | ICD-10-CM | POA: Diagnosis not present

## 2019-11-27 DIAGNOSIS — Z7984 Long term (current) use of oral hypoglycemic drugs: Secondary | ICD-10-CM | POA: Diagnosis not present

## 2020-01-09 DIAGNOSIS — I10 Essential (primary) hypertension: Secondary | ICD-10-CM | POA: Diagnosis not present

## 2020-01-09 DIAGNOSIS — E1140 Type 2 diabetes mellitus with diabetic neuropathy, unspecified: Secondary | ICD-10-CM | POA: Diagnosis not present

## 2020-01-25 DIAGNOSIS — Z23 Encounter for immunization: Secondary | ICD-10-CM | POA: Diagnosis not present

## 2020-02-22 DIAGNOSIS — Z23 Encounter for immunization: Secondary | ICD-10-CM | POA: Diagnosis not present

## 2020-03-05 DIAGNOSIS — H401132 Primary open-angle glaucoma, bilateral, moderate stage: Secondary | ICD-10-CM | POA: Diagnosis not present

## 2020-03-18 DIAGNOSIS — E1140 Type 2 diabetes mellitus with diabetic neuropathy, unspecified: Secondary | ICD-10-CM | POA: Diagnosis not present

## 2020-03-18 DIAGNOSIS — B351 Tinea unguium: Secondary | ICD-10-CM | POA: Diagnosis not present

## 2020-05-06 DIAGNOSIS — I10 Essential (primary) hypertension: Secondary | ICD-10-CM | POA: Diagnosis not present

## 2020-05-06 DIAGNOSIS — E1140 Type 2 diabetes mellitus with diabetic neuropathy, unspecified: Secondary | ICD-10-CM | POA: Diagnosis not present

## 2020-05-06 DIAGNOSIS — Z79899 Other long term (current) drug therapy: Secondary | ICD-10-CM | POA: Diagnosis not present

## 2020-05-06 DIAGNOSIS — E1159 Type 2 diabetes mellitus with other circulatory complications: Secondary | ICD-10-CM | POA: Diagnosis not present

## 2020-05-13 DIAGNOSIS — E1140 Type 2 diabetes mellitus with diabetic neuropathy, unspecified: Secondary | ICD-10-CM | POA: Diagnosis not present

## 2020-05-13 DIAGNOSIS — I350 Nonrheumatic aortic (valve) stenosis: Secondary | ICD-10-CM | POA: Diagnosis not present

## 2020-05-13 DIAGNOSIS — I10 Essential (primary) hypertension: Secondary | ICD-10-CM | POA: Diagnosis not present

## 2020-05-13 DIAGNOSIS — D649 Anemia, unspecified: Secondary | ICD-10-CM | POA: Diagnosis not present

## 2020-05-13 DIAGNOSIS — Z6830 Body mass index (BMI) 30.0-30.9, adult: Secondary | ICD-10-CM | POA: Diagnosis not present

## 2020-05-21 DIAGNOSIS — C44629 Squamous cell carcinoma of skin of left upper limb, including shoulder: Secondary | ICD-10-CM | POA: Diagnosis not present

## 2020-05-21 DIAGNOSIS — L570 Actinic keratosis: Secondary | ICD-10-CM | POA: Diagnosis not present

## 2020-05-21 DIAGNOSIS — X32XXXA Exposure to sunlight, initial encounter: Secondary | ICD-10-CM | POA: Diagnosis not present

## 2020-05-22 ENCOUNTER — Telehealth: Payer: Self-pay

## 2020-05-22 NOTE — Telephone Encounter (Signed)
°  Patient Consent for Virtual Visit         [name] has provided verbal consent on 05/22/2020 for a virtual visit (video or telephone).   CONSENT FOR VIRTUAL VISIT FOR:  [name]  By participating in this virtual visit I agree to the following:  I hereby voluntarily request, consent and authorize [facility] and its employed or contracted physicians, physician assistants, nurse practitioners or other licensed health care professionals (the Practitioner), to provide me with telemedicine health care services (the Services") as deemed necessary by the treating Practitioner. I acknowledge and consent to receive the Services by the Practitioner via telemedicine. I understand that the telemedicine visit will involve communicating with the Practitioner through live audiovisual communication technology and the disclosure of certain medical information by electronic transmission. I acknowledge that I have been given the opportunity to request an in-person assessment or other available alternative prior to the telemedicine visit and am voluntarily participating in the telemedicine visit.  I understand that I have the right to withhold or withdraw my consent to the use of telemedicine in the course of my care at any time, without affecting my right to future care or treatment, and that the Practitioner or I may terminate the telemedicine visit at any time. I understand that I have the right to inspect all information obtained and/or recorded in the course of the telemedicine visit and may receive copies of available information for a reasonable fee.  I understand that some of the potential risks of receiving the Services via telemedicine include:   Delay or interruption in medical evaluation due to technological equipment failure or disruption;  Information transmitted may not be sufficient (e.g. poor resolution of images) to allow for appropriate medical decision making by the  Practitioner; and/or   In rare instances, security protocols could fail, causing a breach of personal health information.  Furthermore, I acknowledge that it is my responsibility to provide information about my medical history, conditions and care that is complete and accurate to the best of my ability. I acknowledge that Practitioner's advice, recommendations, and/or decision may be based on factors not within their control, such as incomplete or inaccurate data provided by me or distortions of diagnostic images or specimens that may result from electronic transmissions. I understand that the practice of medicine is not an exact science and that Practitioner makes no warranties or guarantees regarding treatment outcomes. I acknowledge that a copy of this consent can be made available to me via my patient portal ([facility]), or I can request a printed copy by calling the office of [facility].    I understand that my insurance will be billed for this visit.   I have read or had this consent read to me.  I understand the contents of this consent, which adequately explains the benefits and risks of the Services being provided via telemedicine.   I have been provided ample opportunity to ask questions regarding this consent and the Services and have had my questions answered to my satisfaction.  I give my informed consent for the services to be provided through the use of telemedicine in my medical care

## 2020-05-22 NOTE — Progress Notes (Signed)
Virtual Visit via Telephone Note   This visit type was conducted due to national recommendations for restrictions regarding the COVID-19 Pandemic (e.g. social distancing) in an effort to limit this patient's exposure and mitigate transmission in our community.  Due to his co-morbid illnesses, this patient is at least at moderate risk for complications without adequate follow up.  This format is felt to be most appropriate for this patient at this time.  The patient did not have access to video technology/had technical difficulties with video requiring transitioning to audio format only (telephone).  All issues noted in this document were discussed and addressed.  No physical exam could be performed with this format.  Please refer to the patient's chart for his  consent to telehealth for [facility].   The patient was identified using 2 identifiers.  Date:  05/23/2020   ID:  [name], DOB Mar 25, 1929, MRN [id number]  Patient Location: Home Provider Location: Home  PCP:  Asencion Noble, MD  Cardiologist:  Rozann Lesches, MD Electrophysiologist:  None   Evaluation Performed:  Follow-Up Visit  Chief Complaint:   Cardiac follow-up  History of Present Illness:    [name] is an 86 y.o. male last seen in July 2020.  We spoke by phone today.  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  He does not report any obvious angina, generally NYHA class II dyspnea with ADLs.  He is status post evaluation at [facility] with TAVR, 34 mm Medtronic Core Valve EVOLUT R via TF approach in November 2019.  He continues to follow at [facility] and had an echocardiogram in December 2020 as outlined below.  LVEF normal range with mild LVH and he does have a mild posterior perivalvular AV leak.  I reviewed his medications which are stable from a cardiac perspective and outlined below.  He anticipates having  lab work and follow-up with Dr. Willey Blade in September.   Past Medical History:  Diagnosis Date  . Aortic stenosis    Status post TAVR at [facility], November 2019  . CAD (coronary artery disease)    DES to ostial RCA October 2019 at [facility]  . Essential hypertension   . Glaucoma   . Guillain-Barre syndrome ([place]) 06/2013   Treated at [facility]  . History of nephrolithiasis   . History of pneumonia   . Hyperlipidemia   . Patent ductus arteriosus    Surgically repaired at age 73  . Prostate cancer ([name])   . Type 2 diabetes mellitus ([place])    Past Surgical History:  Procedure Laterality Date  . CARPAL TUNNEL RELEASE  2012   left  . CARPAL TUNNEL RELEASE Right 06/12/2014   Procedure: RIGHT CARPAL TUNNEL RELEASE ;  Surgeon: Wynonia Sours, MD;  Location: [facility];  Service: Orthopedics;  Laterality: Right;  . CATARACT EXTRACTION W/PHACO Left 10/13/2016   Procedure: CATARACT EXTRACTION PHACO AND INTRAOCULAR LENS PLACEMENT LEFT EYE CDE=15.04;  Surgeon: Rutherford Guys, MD;  Location: [facility];  Service: Ophthalmology;  Laterality: Left;  left  . CATARACT EXTRACTION W/PHACO Right 11/03/2016   Procedure: CATARACT EXTRACTION PHACO AND INTRAOCULAR LENS PLACEMENT (IOC);  Surgeon: Rutherford Guys, MD;  Location: [facility];  Service: Ophthalmology;  Laterality: Right;  CDE: 15.10  . COLONOSCOPY    . KNEE ARTHROSCOPY  1962   left  . Patent ductus arteriosus repair     1951 -  age 23  . ULNAR NERVE TRANSPOSITION Right 06/12/2014   Procedure: DECOMPRESSION ULNAR NERVE RIGHT ELBOW;  Surgeon: Wynonia Sours, MD;  Location: [facility];  Service: Orthopedics;  Laterality: Right;  Marland Kitchen VIDEO ASSISTED THORACOSCOPY (VATS)/THOROCOTOMY  8/14   left-guillian-barre     Current Meds  Medication Sig  . aspirin 81 MG tablet Take 81 mg by mouth every evening.   . ferrous sulfate 325 (65 FE) MG tablet Take 325 mg by mouth daily.  Marland Kitchen latanoprost (XALATAN) 0.005 % ophthalmic solution Place 1 drop into both eyes at  bedtime.  Marland Kitchen lisinopril (ZESTRIL) 20 MG tablet Take 20 mg by mouth daily.  . metFORMIN (GLUCOPHAGE) 500 MG tablet Take 500 mg by mouth 2 (two) times daily with a meal.  . pyridOXINE (VITAMIN B-6) 100 MG tablet Take 100 mg by mouth daily.  . simvastatin (ZOCOR) 20 MG tablet Take 20 mg by mouth at bedtime.  . vitamin B-12 (CYANOCOBALAMIN) 1000 MCG tablet Take 1,000 mcg by mouth daily.  . [DISCONTINUED] clopidogrel (PLAVIX) 75 MG tablet Take 75 mg by mouth daily.  . [DISCONTINUED] ferrous sulfate 325 (65 FE) MG EC tablet Take 325 mg by mouth 3 (three) times daily with meals.  . [DISCONTINUED] lisinopril (ZESTRIL) 20 MG tablet Take 1 tablet (20 mg total) by mouth daily.     Allergies:   Ativan [lorazepam]   ROS:   No palpitations or syncope.  Prior CV studies:   The following studies were reviewed today:  Echocardiogram 11/28/2019 ([facility]): AORTIC ROOT     Size: Normal  Dissection: INDETERM FOR DISSECTION  AORTIC VALVE   Leaflets: OTHER PROSTHETIC   Morphology: Normal   Mobility: Fully Mobile   AOV Note: Corevalve Evolute 34 mm  LEFT VENTRICLE                   Anterior: Normal     Size: Normal                 Lateral: Normal  Contraction: Normal                 Septal: Normal  Closest EF: >55% (Estimated)            Apical: Normal   LV masses: No Masses               Inferior: Normal      LVH: MILD LVH               Posterior: Normal  LV GLS(AVG): -15.4% Normal Range [ <= -16] Dias.FxClass: N/A  MITRAL VALVE   Leaflets: Normal         Mobility: Fully mobile  Morphology: Normal  LEFT ATRIUM     Size: SEVERELY ENLARGED   LA masses: No masses        Normal IAS  MAIN PA     Size: Not seen  PULMONIC VALVE  Morphology: Normal   Mobility: Fully Mobile  RIGHT VENTRICLE     Size: Normal          Free wall: Normal   Contraction: Normal          RV masses: No Masses     TAPSE:  2.7 cm, Normal Range [>= 1.6 cm]  TRICUSPID VALVE   Leaflets: Normal         Mobility: Fully mobile  Morphology: Normal  RIGHT ATRIUM     Size: Normal  RA Other: None   RA masses: No masses  PERICARDIUM     Fluid: No effusion  INFERIOR VENACAVA     Size: Normal   Normal respiratory collapse  DOPPLER ECHO and OTHER SPECIAL PROCEDURES ------------------------------------   Aortic: MILD AR        TAVR   1.7 m/s peak vel  11 mmHg peak grad  6 mmHg mean grad 2.0 cm2 by DOPPLER  LVOT Diam: 2.1 cm. Resting LVOT Vel: 0.9 m/s. Dimensionless Index: 0.58    Mitral: TRIVIAL MR       No MS   MV Inflow E Vel.= 101.0 cm/s MV Annulus E'Vel.= 5.2 cm/s E/E'Ratio= 19  Tricuspid: MILD TR        No TS       3.2 m/s peak TR vel  44 mmHg peak RV pressure  Pulmonary: No PR         No PS    Other:  INTERPRETATION ---------------------------------------------------------------  NORMAL LEFT VENTRICULAR SYSTOLIC FUNCTION WITH MILD LVH  NORMAL RIGHT VENTRICULAR SYSTOLIC FUNCTION  VALVULAR REGURGITATION: MILD AR, TRIVIAL MR, MILD TR  S/P TAVR  MILD POSTERIOR PERIVALVULAR AV REGURGITATION  NEW BASELINE AFTER TAVR PROCEDURE.  Labs/Other Tests and Data Reviewed:    EKG:  An ECG dated 06/26/2019 was personally reviewed today and demonstrated:  Sinus rhythm with prolonged PR interval and poor R wave progression.  Recent Labs:  May 2020: Hemoglobin 11.5, platelets 302  Wt Readings from Last 3 Encounters:  05/23/20 191 lb (86.6 kg)  07/31/19 195 lb 5.2 oz (88.6 kg)  06/26/19 191 lb (86.6 kg)     Objective:    Vital Signs:  Ht 5' 6.5" (1.689 m)   Wt 191 lb (86.6 kg)   BMI 30.37 kg/m    Unable to obtain vital signs today. Patient spoke in full sentences, not short of breath. No audible wheezing or coughing.  ASSESSMENT  & PLAN:    1.  History of severe calcific aortic stenosis status post TAVR at [facility] in 2019.  He remains clinically stable and had a follow-up echocardiogram at [facility] in December 2020 as noted above.  Valve function is grossly normal with a mild posterior paravalvular AV leak.  He has completed course of Plavix.  2.  CAD status post DES to the ostial RCA in October 2019 at [facility].  He does not describe any active angina at this time.  Continue aspirin, lisinopril, and Zocor.  3.  Mixed hyperlipidemia, he continues on Zocor.  Anticipate follow-up lab work with Dr. Willey Blade in September.   Time:   Today, I have spent 7 minutes with the patient with telehealth technology discussing the above problems.     Medication Adjustments/Labs and Tests Ordered: Current medicines are reviewed at length with the patient today.  Concerns regarding medicines are outlined above.   Tests Ordered: No orders of the defined types were placed in this encounter.   Medication Changes: No orders of the defined types were placed in this encounter.   Follow Up:  In Person 1 year in the [place] office.  Signed, Rozann Lesches, MD  05/23/2020 9:43 AM    [place]

## 2020-05-23 ENCOUNTER — Encounter: Payer: Self-pay | Admitting: Cardiology

## 2020-05-23 ENCOUNTER — Telehealth (INDEPENDENT_AMBULATORY_CARE_PROVIDER_SITE_OTHER): Payer: Medicare Other | Admitting: Cardiology

## 2020-05-23 VITALS — Ht 66.5 in | Wt 191.0 lb

## 2020-05-23 DIAGNOSIS — E782 Mixed hyperlipidemia: Secondary | ICD-10-CM | POA: Diagnosis not present

## 2020-05-23 DIAGNOSIS — I25119 Atherosclerotic heart disease of native coronary artery with unspecified angina pectoris: Secondary | ICD-10-CM

## 2020-05-23 DIAGNOSIS — Z953 Presence of xenogenic heart valve: Secondary | ICD-10-CM

## 2020-05-23 NOTE — Patient Instructions (Signed)
Medication Instructions:  Your physician recommends that you continue on your current medications as directed. Please refer to the Current Medication list given to you today.  *If you need a refill on your cardiac medications before your next appointment, please call your pharmacy*   Lab [facility]   If you have labs (blood work) drawn today and your tests are completely normal, you will receive your results only by: Marland Kitchen MyChart Message (if you have MyChart) OR . A paper copy in the mail If you have any lab test that is abnormal or we need to change your treatment, we will call you to review the results.   Testing/Procedures:NONE ORDERED  TODAY     Follow-Up: At [facility], you and your health needs are our priority.  As part of our continuing mission to provide you with exceptional heart care, we have created designated Provider Care Teams.  These Care Teams include your primary Cardiologist (physician) and Advanced Practice Providers (APPs -  Physician Assistants and Nurse Practitioners) who all work together to provide you with the care you need, when you need it.  We recommend signing up for the patient portal called "MyChart".  Sign up information is provided on this After Visit Summary.  MyChart is used to connect with patients for Virtual Visits (Telemedicine).  Patients are able to view lab/test results, encounter notes, upcoming appointments, etc.  Non-urgent messages can be sent to your provider as well.   To learn more about what you can do with MyChart, go to [email or web address].    Your next appointment:   1 year(s)  The format for your next appointment:   In Person  Provider:   You may see Rozann Lesches, MD or one of the following Advanced Practice Providers on your designated Care Team:    Bernerd Pho, PA-C   Ermalinda Barrios, [place]     Other Instructions

## 2020-07-15 DIAGNOSIS — B351 Tinea unguium: Secondary | ICD-10-CM | POA: Diagnosis not present

## 2020-07-15 DIAGNOSIS — E1151 Type 2 diabetes mellitus with diabetic peripheral angiopathy without gangrene: Secondary | ICD-10-CM | POA: Diagnosis not present

## 2020-07-15 DIAGNOSIS — E1140 Type 2 diabetes mellitus with diabetic neuropathy, unspecified: Secondary | ICD-10-CM | POA: Diagnosis not present

## 2020-09-02 ENCOUNTER — Other Ambulatory Visit: Payer: Self-pay

## 2020-09-02 ENCOUNTER — Other Ambulatory Visit: Payer: Self-pay | Admitting: Internal Medicine

## 2020-09-02 ENCOUNTER — Other Ambulatory Visit: Payer: Medicare Other

## 2020-09-02 DIAGNOSIS — Z20822 Contact with and (suspected) exposure to covid-19: Secondary | ICD-10-CM

## 2020-09-04 LAB — SARS-COV-2, NAA 2 DAY TAT

## 2020-09-04 LAB — NOVEL CORONAVIRUS, NAA: SARS-CoV-2, NAA: NOT DETECTED

## 2020-09-10 DIAGNOSIS — Z79899 Other long term (current) drug therapy: Secondary | ICD-10-CM | POA: Diagnosis not present

## 2020-09-10 DIAGNOSIS — E1140 Type 2 diabetes mellitus with diabetic neuropathy, unspecified: Secondary | ICD-10-CM | POA: Diagnosis not present

## 2020-09-10 DIAGNOSIS — D649 Anemia, unspecified: Secondary | ICD-10-CM | POA: Diagnosis not present

## 2020-09-10 DIAGNOSIS — Z125 Encounter for screening for malignant neoplasm of prostate: Secondary | ICD-10-CM | POA: Diagnosis not present

## 2020-09-10 DIAGNOSIS — I10 Essential (primary) hypertension: Secondary | ICD-10-CM | POA: Diagnosis not present

## 2020-09-10 DIAGNOSIS — I350 Nonrheumatic aortic (valve) stenosis: Secondary | ICD-10-CM | POA: Diagnosis not present

## 2020-09-17 DIAGNOSIS — H401132 Primary open-angle glaucoma, bilateral, moderate stage: Secondary | ICD-10-CM | POA: Diagnosis not present

## 2020-09-23 DIAGNOSIS — Z8546 Personal history of malignant neoplasm of prostate: Secondary | ICD-10-CM | POA: Diagnosis not present

## 2020-09-23 DIAGNOSIS — E785 Hyperlipidemia, unspecified: Secondary | ICD-10-CM | POA: Diagnosis not present

## 2020-09-23 DIAGNOSIS — E875 Hyperkalemia: Secondary | ICD-10-CM | POA: Diagnosis not present

## 2020-09-23 DIAGNOSIS — N289 Disorder of kidney and ureter, unspecified: Secondary | ICD-10-CM | POA: Diagnosis not present

## 2020-09-23 DIAGNOSIS — E1140 Type 2 diabetes mellitus with diabetic neuropathy, unspecified: Secondary | ICD-10-CM | POA: Diagnosis not present

## 2020-10-08 DIAGNOSIS — E1140 Type 2 diabetes mellitus with diabetic neuropathy, unspecified: Secondary | ICD-10-CM | POA: Diagnosis not present

## 2020-10-08 DIAGNOSIS — E875 Hyperkalemia: Secondary | ICD-10-CM | POA: Diagnosis not present

## 2020-10-08 DIAGNOSIS — Z79899 Other long term (current) drug therapy: Secondary | ICD-10-CM | POA: Diagnosis not present

## 2020-10-27 DIAGNOSIS — Z20822 Contact with and (suspected) exposure to covid-19: Secondary | ICD-10-CM | POA: Diagnosis not present

## 2020-11-07 DIAGNOSIS — Z23 Encounter for immunization: Secondary | ICD-10-CM | POA: Diagnosis not present

## 2020-11-18 DIAGNOSIS — E1140 Type 2 diabetes mellitus with diabetic neuropathy, unspecified: Secondary | ICD-10-CM | POA: Diagnosis not present

## 2020-11-18 DIAGNOSIS — I2510 Atherosclerotic heart disease of native coronary artery without angina pectoris: Secondary | ICD-10-CM | POA: Diagnosis not present

## 2020-11-18 DIAGNOSIS — I2583 Coronary atherosclerosis due to lipid rich plaque: Secondary | ICD-10-CM | POA: Diagnosis not present

## 2020-11-18 DIAGNOSIS — E782 Mixed hyperlipidemia: Secondary | ICD-10-CM | POA: Diagnosis not present

## 2020-11-18 DIAGNOSIS — Z952 Presence of prosthetic heart valve: Secondary | ICD-10-CM | POA: Diagnosis not present

## 2020-11-18 DIAGNOSIS — E1151 Type 2 diabetes mellitus with diabetic peripheral angiopathy without gangrene: Secondary | ICD-10-CM | POA: Diagnosis not present

## 2020-11-18 DIAGNOSIS — Z955 Presence of coronary angioplasty implant and graft: Secondary | ICD-10-CM | POA: Diagnosis not present

## 2020-11-18 DIAGNOSIS — I10 Essential (primary) hypertension: Secondary | ICD-10-CM | POA: Diagnosis not present

## 2020-11-18 DIAGNOSIS — B351 Tinea unguium: Secondary | ICD-10-CM | POA: Diagnosis not present

## 2020-12-10 DIAGNOSIS — Z23 Encounter for immunization: Secondary | ICD-10-CM | POA: Diagnosis not present

## 2021-03-11 DIAGNOSIS — E1140 Type 2 diabetes mellitus with diabetic neuropathy, unspecified: Secondary | ICD-10-CM | POA: Diagnosis not present

## 2021-03-11 DIAGNOSIS — N182 Chronic kidney disease, stage 2 (mild): Secondary | ICD-10-CM | POA: Diagnosis not present

## 2021-03-11 DIAGNOSIS — E875 Hyperkalemia: Secondary | ICD-10-CM | POA: Diagnosis not present

## 2021-03-11 DIAGNOSIS — I2510 Atherosclerotic heart disease of native coronary artery without angina pectoris: Secondary | ICD-10-CM | POA: Diagnosis not present

## 2021-03-11 DIAGNOSIS — E785 Hyperlipidemia, unspecified: Secondary | ICD-10-CM | POA: Diagnosis not present

## 2021-03-17 DIAGNOSIS — E1140 Type 2 diabetes mellitus with diabetic neuropathy, unspecified: Secondary | ICD-10-CM | POA: Diagnosis not present

## 2021-03-17 DIAGNOSIS — B351 Tinea unguium: Secondary | ICD-10-CM | POA: Diagnosis not present

## 2021-03-17 DIAGNOSIS — E1151 Type 2 diabetes mellitus with diabetic peripheral angiopathy without gangrene: Secondary | ICD-10-CM | POA: Diagnosis not present

## 2021-03-18 DIAGNOSIS — E1122 Type 2 diabetes mellitus with diabetic chronic kidney disease: Secondary | ICD-10-CM | POA: Diagnosis not present

## 2021-03-18 DIAGNOSIS — R7309 Other abnormal glucose: Secondary | ICD-10-CM | POA: Diagnosis not present

## 2021-03-18 DIAGNOSIS — N1831 Chronic kidney disease, stage 3a: Secondary | ICD-10-CM | POA: Diagnosis not present

## 2021-03-18 DIAGNOSIS — I10 Essential (primary) hypertension: Secondary | ICD-10-CM | POA: Diagnosis not present

## 2021-04-07 DIAGNOSIS — E119 Type 2 diabetes mellitus without complications: Secondary | ICD-10-CM | POA: Diagnosis not present

## 2021-04-07 DIAGNOSIS — Z961 Presence of intraocular lens: Secondary | ICD-10-CM | POA: Diagnosis not present

## 2021-04-07 DIAGNOSIS — Z7984 Long term (current) use of oral hypoglycemic drugs: Secondary | ICD-10-CM | POA: Diagnosis not present

## 2021-04-07 DIAGNOSIS — H401132 Primary open-angle glaucoma, bilateral, moderate stage: Secondary | ICD-10-CM | POA: Diagnosis not present

## 2021-06-24 DIAGNOSIS — I10 Essential (primary) hypertension: Secondary | ICD-10-CM | POA: Diagnosis not present

## 2021-06-24 DIAGNOSIS — Z79899 Other long term (current) drug therapy: Secondary | ICD-10-CM | POA: Diagnosis not present

## 2021-06-24 DIAGNOSIS — N1831 Chronic kidney disease, stage 3a: Secondary | ICD-10-CM | POA: Diagnosis not present

## 2021-06-24 DIAGNOSIS — E1129 Type 2 diabetes mellitus with other diabetic kidney complication: Secondary | ICD-10-CM | POA: Diagnosis not present

## 2021-06-30 DIAGNOSIS — E1140 Type 2 diabetes mellitus with diabetic neuropathy, unspecified: Secondary | ICD-10-CM | POA: Diagnosis not present

## 2021-06-30 DIAGNOSIS — E1122 Type 2 diabetes mellitus with diabetic chronic kidney disease: Secondary | ICD-10-CM | POA: Diagnosis not present

## 2021-06-30 DIAGNOSIS — B351 Tinea unguium: Secondary | ICD-10-CM | POA: Diagnosis not present

## 2021-06-30 DIAGNOSIS — E1151 Type 2 diabetes mellitus with diabetic peripheral angiopathy without gangrene: Secondary | ICD-10-CM | POA: Diagnosis not present

## 2021-06-30 DIAGNOSIS — I10 Essential (primary) hypertension: Secondary | ICD-10-CM | POA: Diagnosis not present

## 2021-06-30 DIAGNOSIS — N1831 Chronic kidney disease, stage 3a: Secondary | ICD-10-CM | POA: Diagnosis not present

## 2021-06-30 DIAGNOSIS — R7309 Other abnormal glucose: Secondary | ICD-10-CM | POA: Diagnosis not present

## 2021-09-15 ENCOUNTER — Telehealth: Payer: Self-pay | Admitting: Cardiology

## 2021-09-15 DIAGNOSIS — Z953 Presence of xenogenic heart valve: Secondary | ICD-10-CM

## 2021-09-15 NOTE — Telephone Encounter (Signed)
Patient wants to know if he should have a echocardiogram prior to his visit with Domenic Polite in February.

## 2021-09-15 NOTE — Telephone Encounter (Addendum)
Left message for pt to return call. Order placed for 01/2022 echo.

## 2021-09-15 NOTE — Telephone Encounter (Signed)
Last  echo done 10/2020 by [facility] in [place].     I will ask Dr.McDowell

## 2021-09-18 NOTE — Telephone Encounter (Signed)
Left message to return call 

## 2021-09-19 NOTE — Telephone Encounter (Signed)
Patient returned call to [facility] please call him on his cell phone [phone]

## 2021-09-19 NOTE — Telephone Encounter (Signed)
Echo ordered placed, front office staff will call to schedule echo in february

## 2021-09-25 DIAGNOSIS — I10 Essential (primary) hypertension: Secondary | ICD-10-CM | POA: Diagnosis not present

## 2021-09-25 DIAGNOSIS — Z79899 Other long term (current) drug therapy: Secondary | ICD-10-CM | POA: Diagnosis not present

## 2021-09-25 DIAGNOSIS — Z125 Encounter for screening for malignant neoplasm of prostate: Secondary | ICD-10-CM | POA: Diagnosis not present

## 2021-09-25 DIAGNOSIS — E1129 Type 2 diabetes mellitus with other diabetic kidney complication: Secondary | ICD-10-CM | POA: Diagnosis not present

## 2021-09-25 DIAGNOSIS — N1831 Chronic kidney disease, stage 3a: Secondary | ICD-10-CM | POA: Diagnosis not present

## 2021-10-02 DIAGNOSIS — R7309 Other abnormal glucose: Secondary | ICD-10-CM | POA: Diagnosis not present

## 2021-10-02 DIAGNOSIS — E785 Hyperlipidemia, unspecified: Secondary | ICD-10-CM | POA: Diagnosis not present

## 2021-10-02 DIAGNOSIS — I10 Essential (primary) hypertension: Secondary | ICD-10-CM | POA: Diagnosis not present

## 2021-10-02 DIAGNOSIS — N1831 Chronic kidney disease, stage 3a: Secondary | ICD-10-CM | POA: Diagnosis not present

## 2021-10-02 DIAGNOSIS — E1122 Type 2 diabetes mellitus with diabetic chronic kidney disease: Secondary | ICD-10-CM | POA: Diagnosis not present

## 2021-10-02 DIAGNOSIS — Z8546 Personal history of malignant neoplasm of prostate: Secondary | ICD-10-CM | POA: Diagnosis not present

## 2021-10-07 DIAGNOSIS — H401132 Primary open-angle glaucoma, bilateral, moderate stage: Secondary | ICD-10-CM | POA: Diagnosis not present

## 2021-10-27 DIAGNOSIS — E1140 Type 2 diabetes mellitus with diabetic neuropathy, unspecified: Secondary | ICD-10-CM | POA: Diagnosis not present

## 2021-10-27 DIAGNOSIS — B351 Tinea unguium: Secondary | ICD-10-CM | POA: Diagnosis not present

## 2021-10-27 DIAGNOSIS — E1151 Type 2 diabetes mellitus with diabetic peripheral angiopathy without gangrene: Secondary | ICD-10-CM | POA: Diagnosis not present

## 2022-01-15 ENCOUNTER — Ambulatory Visit: Payer: Medicare Other | Admitting: Cardiology

## 2022-01-21 ENCOUNTER — Other Ambulatory Visit: Payer: Self-pay

## 2022-01-21 ENCOUNTER — Ambulatory Visit (HOSPITAL_COMMUNITY)
Admission: RE | Admit: 2022-01-21 | Discharge: 2022-01-21 | Disposition: A | Discharge status: Home / Self Care | Payer: Medicare Other | Source: Ambulatory Visit | Attending: Cardiology | Admitting: Cardiology

## 2022-01-21 DIAGNOSIS — Z953 Presence of xenogenic heart valve: Secondary | ICD-10-CM | POA: Diagnosis not present

## 2022-01-21 LAB — ECHOCARDIOGRAM COMPLETE
AR max vel: 1.5 cm2
AV Area VTI: 1.48 cm2
AV Area mean vel: 1.51 cm2
AV Mean grad: 5 mmHg
AV Peak grad: 10.5 mmHg
Ao pk vel: 1.62 m/s
Area-P 1/2: 3.12 cm2
S' Lateral: 2.7 cm

## 2022-01-21 NOTE — Progress Notes (Signed)
*  PRELIMINARY RESULTS* Echocardiogram 2D Echocardiogram has been performed.  [name] 01/21/2022, 10:38 AM

## 2022-01-23 ENCOUNTER — Telehealth: Payer: Self-pay | Admitting: Cardiology

## 2022-01-23 NOTE — Telephone Encounter (Signed)
Patient was returning call for results 

## 2022-01-23 NOTE — Telephone Encounter (Signed)
Echo results given to patient, has f/u 01/26/22,copied pcp

## 2022-01-25 NOTE — Progress Notes (Signed)
Cardiology Office Note  Date: 01/26/2022   ID: [name], Douglas 01-24-1929, MRN [id number]  PCP:  Asencion Noble, MD  Cardiologist:  Rozann Lesches, MD Electrophysiologist:  None   Chief Complaint  Patient presents with   Cardiac follow-up    History of Present Illness: [name] is a 88 y.o. male last assessed via telehealth encounter in June 2021. He has had interval follow-up through [facility]  He is here for a routine visit.  Reports general lack of stamina, admits that he is not very active however, has had some falls with leg weakness.  He does not report any angina, no palpitations or syncope.  NYHA class II-III dyspnea.  Recent follow-up echocardiogram revealed LVEF 60 to 65% with mild diastolic dysfunction, mild biatrial enlargement, trivial mitral regurgitation, and stable TAVR prosthesis with mild aortic regurgitation and mean gradient 5 mmHg.  I discussed the results with him today.  We went over his medications which are noted below.  He reports no intolerances.  He has a visit with lab work scheduled soon per Dr. Willey [name].  I personally reviewed his ECG today which shows sinus rhythm with prolonged PR interval and PVC, nonspecific inferolateral ST-T wave abnormalities, probable repolarization changes.  Past Medical History:  Diagnosis Date   Aortic stenosis    Status post TAVR at [facility], November 2019   CAD (coronary artery disease)    DES to ostial RCA October 2019 at [place] hypertension    Glaucoma    Guillain-Barre syndrome ([place]) 06/2013   Treated at [facility]   History of nephrolithiasis    History of pneumonia    Hyperlipidemia    Patent ductus arteriosus    Surgically repaired at age 20   Prostate cancer ([place])    Type 2 diabetes mellitus ([place])     Past Surgical History:  Procedure Laterality Date   CARPAL TUNNEL RELEASE  2012   left   CARPAL TUNNEL RELEASE Right 06/12/2014   Procedure: RIGHT CARPAL TUNNEL RELEASE ;  Surgeon: Wynonia Sours, MD;  Location: [place];  Service: Orthopedics;  Laterality: Right;   CATARACT EXTRACTION W/PHACO Left 10/13/2016   Procedure: CATARACT EXTRACTION PHACO AND INTRAOCULAR LENS PLACEMENT LEFT EYE CDE=15.04;  Surgeon: Rutherford Guys, MD;  [facility];  Service: Ophthalmology;  Laterality: Left;  left   CATARACT EXTRACTION W/PHACO Right 11/03/2016   Procedure: CATARACT EXTRACTION PHACO AND INTRAOCULAR LENS PLACEMENT (IOC);  Surgeon: Rutherford Guys, MD;  [facility]: [facility];  Service: Ophthalmology;  Laterality: Right;  CDE: 15.10   COLONOSCOPY     KNEE ARTHROSCOPY  1962   left   Patent ductus arteriosus repair     1951 - age 25   ULNAR NERVE TRANSPOSITION Right 06/12/2014   Procedure: DECOMPRESSION ULNAR NERVE RIGHT ELBOW;  Surgeon: Wynonia Sours, MD;  Location: [place];  Service: Orthopedics;  Laterality: Right;   VIDEO ASSISTED THORACOSCOPY (VATS)/THOROCOTOMY  8/14   left-guillian-barre    Current Outpatient Medications  Medication Sig Dispense Refill   amLODipine (NORVASC) 5 MG tablet Take 5 mg by mouth daily.     aspirin 81 MG tablet Take 81 mg by mouth every evening.      ferrous sulfate 325 (65 FE) MG tablet Take 325 mg by mouth daily.     hydrochlorothiazide (HYDRODIURIL) 25 MG tablet Take 25 mg by mouth every morning.     latanoprost (XALATAN) 0.005 % ophthalmic solution Place 1 drop into  both eyes at bedtime.     lisinopril (ZESTRIL) 20 MG tablet Take 20 mg by mouth daily.     metFORMIN (GLUCOPHAGE) 500 MG tablet Take 500 mg by mouth 2 (two) times daily with a meal.     pyridOXINE (VITAMIN B-6) 100 MG tablet Take 100 mg by mouth daily.     simvastatin (ZOCOR) 20 MG tablet Take 20 mg by mouth at bedtime.     vitamin B-12 (CYANOCOBALAMIN) 1000 MCG tablet Take 1,000 mcg by mouth daily.     No current facility-administered medications for this visit.   Allergies:  Ativan [lorazepam]   ROS: No orthopnea or PND.  Physical Exam: VS:  BP 140/70     Pulse 72    Ht 5' 8.5" (1.74 m)    Wt 192 lb (87.1 kg)    SpO2 97%    BMI 28.77 kg/m , BMI Body mass index is 28.77 kg/m.  Wt Readings from Last 3 Encounters:  01/26/22 192 lb (87.1 kg)  05/23/20 191 lb (86.6 kg)  07/31/19 195 lb 5.2 oz (88.6 kg)    General: Elderly male, appears comfortable at rest. HEENT: Conjunctiva and lids normal, wearing a mask. Neck: Supple, no elevated JVP or carotid bruits, no thyromegaly. Lungs: Clear to auscultation, nonlabored breathing at rest. Cardiac: Regular rate and rhythm, no S3, 1/6 systolic murmur, no pericardial rub. Extremities: No pitting edema.  ECG:  An ECG dated 06/26/2019 was personally reviewed today and demonstrated:  Sinus rhythm with prolonged PR interval and poor R wave progression.  Recent Labwork:  No interval lab work for review today.  Other Studies Reviewed Today:  Echocardiogram 01/21/2022:  1. Left ventricular ejection fraction, by estimation, is 60 to 65%. The  left ventricle has normal function. The left ventricle has no regional  wall motion abnormalities. There is moderate left ventricular hypertrophy.  Left ventricular diastolic  parameters are consistent with Grade I diastolic dysfunction (impaired  relaxation). Elevated left atrial pressure.   2. Indeterminant PASP, IVC poorly visualized. . Right ventricular  systolic function is normal. The right ventricular size is normal.   3. Left atrial size was mildly dilated.   4. Right atrial size was mildly dilated.   5. The mitral valve is normal in structure. Trivial mitral valve  regurgitation. No evidence of mitral stenosis.   6. 34 mm Medtronic corevalve is in the AV position. No stenosis, mild  paravalcular leak. . The aortic valve has been repaired/replaced. Aortic  valve regurgitation is mild. No aortic stenosis is present.   Assessment and Plan:  1.  History of severe calcific aortic stenosis status post TAVR at [facility] in 2019.  Recent follow-up echocardiogram shows  normal prosthetic function with mean gradient 5 mmHg and mild aortic regurgitation.  LVEF also normal at 60 to 65%.  He is on low-dose aspirin at this time.  NYHA class II-III dyspnea which is fairly chronic.  2.  CAD status post DES to the ostial RCA in October 2019.  No obvious angina symptoms.  ECG reviewed.  Plan to continue medical therapy and observation.  He is on aspirin, lisinopril, Norvasc, and Zocor.  Medication Adjustments/Labs and Tests Ordered: Current medicines are reviewed at length with the patient today.  Concerns regarding medicines are outlined above.   Tests Ordered: Orders Placed This Encounter  Procedures   EKG 12-Lead    Medication Changes: No orders of the defined types were placed in this encounter.   Disposition:  Follow up  6  months.  Signed, Satira Sark, MD, [facility] 01/26/2022 8:37 AM    [facility] [place]. [address] [place], [place] [address] Phone: [phone]; Fax: [phone]

## 2022-01-26 ENCOUNTER — Ambulatory Visit (INDEPENDENT_AMBULATORY_CARE_PROVIDER_SITE_OTHER): Payer: Medicare Other | Admitting: Cardiology

## 2022-01-26 ENCOUNTER — Other Ambulatory Visit: Payer: Self-pay

## 2022-01-26 ENCOUNTER — Encounter: Payer: Self-pay | Admitting: Cardiology

## 2022-01-26 VITALS — BP 140/70 | HR 72 | Ht 68.5 in | Wt 192.0 lb

## 2022-01-26 DIAGNOSIS — Z953 Presence of xenogenic heart valve: Secondary | ICD-10-CM | POA: Diagnosis not present

## 2022-01-26 DIAGNOSIS — I25119 Atherosclerotic heart disease of native coronary artery with unspecified angina pectoris: Secondary | ICD-10-CM | POA: Diagnosis not present

## 2022-01-26 NOTE — Patient Instructions (Signed)
Medication Instructions:  Your physician recommends that you continue on your current medications as directed. Please refer to the Current Medication list given to you today.   Labwork: None  Testing/Procedures: None  Follow-Up: 6 months  Any Other Special Instructions Will Be Listed Below (If Applicable).     If you need a refill on your cardiac medications before your next appointment, please call your pharmacy.   

## 2022-02-10 DIAGNOSIS — E785 Hyperlipidemia, unspecified: Secondary | ICD-10-CM | POA: Diagnosis not present

## 2022-02-10 DIAGNOSIS — I10 Essential (primary) hypertension: Secondary | ICD-10-CM | POA: Diagnosis not present

## 2022-02-10 DIAGNOSIS — N1831 Chronic kidney disease, stage 3a: Secondary | ICD-10-CM | POA: Diagnosis not present

## 2022-02-10 DIAGNOSIS — C61 Malignant neoplasm of prostate: Secondary | ICD-10-CM | POA: Diagnosis not present

## 2022-02-10 DIAGNOSIS — E1129 Type 2 diabetes mellitus with other diabetic kidney complication: Secondary | ICD-10-CM | POA: Diagnosis not present

## 2022-02-10 DIAGNOSIS — Z79899 Other long term (current) drug therapy: Secondary | ICD-10-CM | POA: Diagnosis not present

## 2022-02-17 DIAGNOSIS — R7309 Other abnormal glucose: Secondary | ICD-10-CM | POA: Diagnosis not present

## 2022-02-17 DIAGNOSIS — N1832 Chronic kidney disease, stage 3b: Secondary | ICD-10-CM | POA: Diagnosis not present

## 2022-02-17 DIAGNOSIS — E1122 Type 2 diabetes mellitus with diabetic chronic kidney disease: Secondary | ICD-10-CM | POA: Diagnosis not present

## 2022-02-17 DIAGNOSIS — E559 Vitamin D deficiency, unspecified: Secondary | ICD-10-CM | POA: Diagnosis not present

## 2022-02-23 DIAGNOSIS — B351 Tinea unguium: Secondary | ICD-10-CM | POA: Diagnosis not present

## 2022-02-23 DIAGNOSIS — E1140 Type 2 diabetes mellitus with diabetic neuropathy, unspecified: Secondary | ICD-10-CM | POA: Diagnosis not present

## 2022-02-23 DIAGNOSIS — E1151 Type 2 diabetes mellitus with diabetic peripheral angiopathy without gangrene: Secondary | ICD-10-CM | POA: Diagnosis not present

## 2022-04-06 DIAGNOSIS — H401132 Primary open-angle glaucoma, bilateral, moderate stage: Secondary | ICD-10-CM | POA: Diagnosis not present

## 2022-04-06 DIAGNOSIS — Z961 Presence of intraocular lens: Secondary | ICD-10-CM | POA: Diagnosis not present

## 2022-04-06 DIAGNOSIS — E119 Type 2 diabetes mellitus without complications: Secondary | ICD-10-CM | POA: Diagnosis not present

## 2022-05-19 DIAGNOSIS — N1832 Chronic kidney disease, stage 3b: Secondary | ICD-10-CM | POA: Diagnosis not present

## 2022-05-19 DIAGNOSIS — E1129 Type 2 diabetes mellitus with other diabetic kidney complication: Secondary | ICD-10-CM | POA: Diagnosis not present

## 2022-05-19 DIAGNOSIS — Z79899 Other long term (current) drug therapy: Secondary | ICD-10-CM | POA: Diagnosis not present

## 2022-05-19 DIAGNOSIS — E211 Secondary hyperparathyroidism, not elsewhere classified: Secondary | ICD-10-CM | POA: Diagnosis not present

## 2022-05-20 DIAGNOSIS — N1832 Chronic kidney disease, stage 3b: Secondary | ICD-10-CM | POA: Diagnosis not present

## 2022-05-20 DIAGNOSIS — E1122 Type 2 diabetes mellitus with diabetic chronic kidney disease: Secondary | ICD-10-CM | POA: Diagnosis not present

## 2022-05-20 DIAGNOSIS — R7309 Other abnormal glucose: Secondary | ICD-10-CM | POA: Diagnosis not present

## 2022-05-20 DIAGNOSIS — I350 Nonrheumatic aortic (valve) stenosis: Secondary | ICD-10-CM | POA: Diagnosis not present

## 2022-06-29 DIAGNOSIS — S90821A Blister (nonthermal), right foot, initial encounter: Secondary | ICD-10-CM | POA: Diagnosis not present

## 2022-06-29 DIAGNOSIS — B351 Tinea unguium: Secondary | ICD-10-CM | POA: Diagnosis not present

## 2022-06-29 DIAGNOSIS — E1140 Type 2 diabetes mellitus with diabetic neuropathy, unspecified: Secondary | ICD-10-CM | POA: Diagnosis not present

## 2022-08-20 NOTE — Progress Notes (Signed)
Cardiology Office Note  Date: 08/21/2022   ID: [name], DOB 23-Feb-1929, MRN [id number]  PCP:  Asencion Noble, MD  Cardiologist:  Rozann Lesches, MD Electrophysiologist:  None   Chief Complaint  Patient presents with   Cardiac follow-up    History of Present Illness: [name] is a 88 y.o. male last seen in February.  He is here for a routine visit.  Reports no definite change in stamina, no exertional chest pain or palpitations.  He is limited by leg and knee arthritis, still lives in his own home and functions with ADLs.  Heart rate was irregular today on examination, ECG and rhythm strip are consistent with sinus rhythm with PACs rather than atrial fibrillation which was my initial concern.  We discussed this today.  He is currently not on any AV nodal blockers.  We went over his medications today as outlined below.  His last echocardiogram was in February for follow-up of TAVR.  Past Medical History:  Diagnosis Date   Aortic stenosis    Status post [facility]AVR at [facility], November 2019   CAD (coronary artery disease)    DES to ostial RCA October 2019 at [place] hypertension    Glaucoma    Guillain-Barre syndrome ([name]) 06/2013   Treated at [facility]   History of nephrolithiasis    History of pneumonia    Hyperlipidemia    Patent ductus arteriosus    Surgically repaired at age 23   Prostate cancer ([name])    Type 2 diabetes mellitus ([name])     Past Surgical History:  Procedure Laterality Date   CARPAL TUNNEL RELEASE  2012   left   CARPAL TUNNEL RELEASE Right 06/12/2014   Procedure: RIGHT CARPAL TUNNEL RELEASE ;  Surgeon: Wynonia Sours, MD;  Location: [place];  Service: Orthopedics;  Laterality: Right;   CATARACT EXTRACTION W/PHACO Left 10/13/2016   Procedure: CATARACT EXTRACTION PHACO AND INTRAOCULAR LENS PLACEMENT LEFT EYE CDE=15.04;  Surgeon: Rutherford Guys, MD;  [facility];  Service: Ophthalmology;  Laterality: Left;  left    CATARACT EXTRACTION W/PHACO Right 11/03/2016   Procedure: CATARACT EXTRACTION PHACO AND INTRAOCULAR LENS PLACEMENT (IOC);  Surgeon: Rutherford Guys, MD;  [facility];  Service: Ophthalmology;  Laterality: Right;  CDE: 15.10   COLONOSCOPY     KNEE ARTHROSCOPY  1962   left   Patent ductus arteriosus repair     1951 - age 57   ULNAR NERVE TRANSPOSITION Right 06/12/2014   Procedure: DECOMPRESSION ULNAR NERVE RIGHT ELBOW;  Surgeon: Wynonia Sours, MD;  Location: [place];  Service: Orthopedics;  Laterality: Right;   VIDEO ASSISTED THORACOSCOPY (VATS)/THOROCOTOMY  8/14   left-guillian-barre    Current Outpatient Medications  Medication Sig Dispense Refill   amLODipine (NORVASC) 5 MG tablet Take 5 mg by mouth daily.     aspirin 81 MG tablet Take 81 mg by mouth every evening.      ferrous sulfate 325 (65 FE) MG tablet Take 325 mg by mouth daily.     hydrochlorothiazide (HYDRODIURIL) 25 MG tablet Take 25 mg by mouth every morning.     latanoprost (XALATAN) 0.005 % ophthalmic solution Place 1 drop into both eyes at bedtime.     lisinopril (ZESTRIL) 20 MG tablet Take 20 mg by mouth daily.     metFORMIN (GLUCOPHAGE) 500 MG tablet Take 500 mg by mouth 2 (two) times daily with a meal.     metoprolol succinate (  TOPROL XL) 25 MG 24 hr tablet Take 0.5 tablets (12.5 mg total) by mouth daily. 45 tablet 3   pyridOXINE (VITAMIN B-6) 100 MG tablet Take 100 mg by mouth daily.     simvastatin (ZOCOR) 20 MG tablet Take 20 mg by mouth at bedtime.     vitamin B-12 (CYANOCOBALAMIN) 1000 MCG tablet Take 1,000 mcg by mouth daily.     No current facility-administered medications for this visit.   Allergies:  Ativan [lorazepam]   ROS:  Arthritic leg and knee pain.  Physical Exam: VS:  BP 138/64   Pulse 94   Ht 5' 8.5" (1.74 m)   Wt 192 lb 6.4 oz (87.3 kg)   SpO2 98%   BMI 28.83 kg/m , BMI Body mass index is 28.83 kg/m.  Wt Readings from Last 3 Encounters:  08/21/22 192 lb 6.4 oz (87.3 kg)   01/26/22 192 lb (87.1 kg)  05/23/20 191 lb (86.6 kg)    General: Patient appears comfortable at rest. HEENT: Conjunctiva and lids normal. Neck: Supple, no elevated JVP or carotid bruits, no thyromegaly. Lungs: Clear to auscultation, nonlabored breathing at rest. Cardiac: Irregular, no S3, 1/6 systolic murmur. Abdomen: Soft, nontender, bowel sounds present. Extremities: Trace ankle edema, distal pulses 2+.  ECG:  An ECG dated 01/26/2022 was personally reviewed today and demonstrated:  Sinus rhythm with prolonged PR interval and PVC, nonspecific inferolateral ST-T wave abnormalities.  Recent Labwork:  February 2023: BUN 30, creatinine 1.53, potassium 4.5, hemoglobin A1c 6.8%, magnesium 2.0  Other Studies Reviewed Today:  Echocardiogram 01/23/2022:  1. Left ventricular ejection fraction, by estimation, is 60 to 65%. The  left ventricle has normal function. The left ventricle has no regional  wall motion abnormalities. There is moderate left ventricular hypertrophy.  Left ventricular diastolic  parameters are consistent with Grade I diastolic dysfunction (impaired  relaxation). Elevated left atrial pressure.   2. Indeterminant PASP, IVC poorly visualized. . Right ventricular  systolic function is normal. The right ventricular size is normal.   3. Left atrial size was mildly dilated.   4. Right atrial size was mildly dilated.   5. The mitral valve is normal in structure. Trivial mitral valve  regurgitation. No evidence of mitral stenosis.   6. 34 mm Medtronic corevalve is in the AV position. No stenosis, mild  paravalcular leak. . The aortic valve has been repaired/replaced. Aortic  valve regurgitation is mild. No aortic stenosis is present.   Assessment and Plan:  1.  History of severe calcific aorta stenosis status post TAVR at [facility] in 2019.  Follow-up echocardiogram in February of this year showed a mean gradient of 5 mmHg with mild paravalvular regurgitation.  Follow-up  echocardiogram for next visit.  2.  Irregular heartbeat today, ECG and rhythm strip showing sinus rhythm with PACs rather than atrial fibrillation which was initial concern.  We will start low-dose Toprol-XL 12.5 mg daily and observe for now.  3.  CAD status post DES to the ostial RCA in October 2019.  He reports no angina.  Continue aspirin, lisinopril, Norvasc and Zocor.  4.  CKD stage IIIb, last creatinine 1.53.  Requesting lab work from Dr. Willey Blade.  Medication Adjustments/Labs and Tests Ordered: Current medicines are reviewed at length with the patient today.  Concerns regarding medicines are outlined above.   Tests Ordered: Orders Placed This Encounter  Procedures   EKG 12-Lead   ECHOCARDIOGRAM COMPLETE    Medication Changes: Meds ordered this encounter  Medications   metoprolol succinate (TOPROL  XL) 25 MG 24 hr tablet    Sig: Take 0.5 tablets (12.5 mg total) by mouth daily.    Dispense:  45 tablet    Refill:  3    Disposition:  Follow up  6 months.  Signed, Satira Sark, MD, [facility] 08/21/2022 9:18 AM    [place] [facility] [address], [place], [place] [address] Phone: [phone]; Fax: [phone]

## 2022-08-21 ENCOUNTER — Encounter: Payer: Self-pay | Admitting: Cardiology

## 2022-08-21 ENCOUNTER — Ambulatory Visit: Payer: Medicare Other | Attending: Cardiology | Admitting: Cardiology

## 2022-08-21 VITALS — BP 138/64 | HR 94 | Ht 68.5 in | Wt 192.4 lb

## 2022-08-21 DIAGNOSIS — N1832 Chronic kidney disease, stage 3b: Secondary | ICD-10-CM

## 2022-08-21 DIAGNOSIS — Z953 Presence of xenogenic heart valve: Secondary | ICD-10-CM

## 2022-08-21 DIAGNOSIS — I25119 Atherosclerotic heart disease of native coronary artery with unspecified angina pectoris: Secondary | ICD-10-CM | POA: Diagnosis not present

## 2022-08-21 MED ORDER — METOPROLOL SUCCINATE ER 25 MG PO TB24: 12.5000 mg | ORAL_TABLET | Freq: Every day | ORAL | 3 refills | Status: DC

## 2022-08-21 NOTE — Patient Instructions (Signed)
Medication Instructions:  START Toprol XL 12.5 mg daily   Labwork: None today  Testing/Procedures: Your physician has requested that you have an echocardiogram in 6 months. Echocardiography is a painless test that uses sound waves to create images of your heart. It provides your doctor with information about the size and shape of your heart and how well your heart's chambers and valves are working. This procedure takes approximately one hour. There are no restrictions for this procedure.   Follow-Up: 6 months  Any Other Special Instructions Will Be Listed Below (If Applicable).  If you need a refill on your cardiac medications before your next appointment, please call your pharmacy.

## 2022-09-09 DIAGNOSIS — Z79899 Other long term (current) drug therapy: Secondary | ICD-10-CM | POA: Diagnosis not present

## 2022-09-09 DIAGNOSIS — E1129 Type 2 diabetes mellitus with other diabetic kidney complication: Secondary | ICD-10-CM | POA: Diagnosis not present

## 2022-09-09 DIAGNOSIS — N1832 Chronic kidney disease, stage 3b: Secondary | ICD-10-CM | POA: Diagnosis not present

## 2022-09-09 DIAGNOSIS — Z125 Encounter for screening for malignant neoplasm of prostate: Secondary | ICD-10-CM | POA: Diagnosis not present

## 2022-09-09 DIAGNOSIS — I350 Nonrheumatic aortic (valve) stenosis: Secondary | ICD-10-CM | POA: Diagnosis not present

## 2022-09-16 DIAGNOSIS — M1712 Unilateral primary osteoarthritis, left knee: Secondary | ICD-10-CM | POA: Diagnosis not present

## 2022-09-16 DIAGNOSIS — E785 Hyperlipidemia, unspecified: Secondary | ICD-10-CM | POA: Diagnosis not present

## 2022-09-16 DIAGNOSIS — Z23 Encounter for immunization: Secondary | ICD-10-CM | POA: Diagnosis not present

## 2022-09-16 DIAGNOSIS — Z8546 Personal history of malignant neoplasm of prostate: Secondary | ICD-10-CM | POA: Diagnosis not present

## 2022-09-16 DIAGNOSIS — M25462 Effusion, left knee: Secondary | ICD-10-CM | POA: Diagnosis not present

## 2022-09-16 DIAGNOSIS — E1122 Type 2 diabetes mellitus with diabetic chronic kidney disease: Secondary | ICD-10-CM | POA: Diagnosis not present

## 2022-10-28 ENCOUNTER — Ambulatory Visit (INDEPENDENT_AMBULATORY_CARE_PROVIDER_SITE_OTHER): Payer: Medicare Other

## 2022-10-28 ENCOUNTER — Encounter: Payer: Self-pay | Admitting: Orthopaedic Surgery

## 2022-10-28 ENCOUNTER — Ambulatory Visit (INDEPENDENT_AMBULATORY_CARE_PROVIDER_SITE_OTHER): Payer: Medicare Other | Admitting: Orthopaedic Surgery

## 2022-10-28 VITALS — BP 167/77 | HR 89 | Ht 68.0 in | Wt 192.0 lb

## 2022-10-28 DIAGNOSIS — M25562 Pain in left knee: Secondary | ICD-10-CM | POA: Diagnosis not present

## 2022-10-28 DIAGNOSIS — M25462 Effusion, left knee: Secondary | ICD-10-CM

## 2022-10-28 DIAGNOSIS — M25561 Pain in right knee: Secondary | ICD-10-CM

## 2022-10-28 DIAGNOSIS — G8929 Other chronic pain: Secondary | ICD-10-CM | POA: Diagnosis not present

## 2022-10-28 DIAGNOSIS — I25119 Atherosclerotic heart disease of native coronary artery with unspecified angina pectoris: Secondary | ICD-10-CM | POA: Diagnosis not present

## 2022-10-28 NOTE — Progress Notes (Signed)
Subjective:    Patient ID: [name], male    DOB: November 21, 1929, 88 y.o.   MRN: [id number]  HPI He has recurrent swelling of both knees.  Dr. Willey Blade usually aspirates his knees but Dr. Willey Blade is out of town.   He says both knees swell but the left knee is so swollen he can hardly walk.  He has no recent trauma.  He uses a walker at times.   He has no redness, no paresthesias.   Review of Systems  Constitutional:  Positive for activity change.  Musculoskeletal:  Positive for arthralgias, gait problem, joint swelling and myalgias.  All other systems reviewed and are negative. For Review of Systems, all other systems reviewed and are negative.  The following is a summary of the past history medically, past history surgically, known current medicines, social history and family history.  This information is gathered electronically by the computer from prior information and documentation.  I review this each visit and have found including this information at this point in the chart is beneficial and informative.   Past Medical History:  Diagnosis Date   Aortic stenosis    Status post TAVR at [facility], November 2019   CAD (coronary artery disease)    DES to ostial RCA October 2019 at [place] hypertension    Glaucoma    Guillain-Barre syndrome ([place]) 06/2013   Treated at [facility]   History of nephrolithiasis    History of pneumonia    Hyperlipidemia    Patent ductus arteriosus    Surgically repaired at age 50   Prostate cancer ([place])    Type 2 diabetes mellitus ([place])     Past Surgical History:  Procedure Laterality Date   CARPAL TUNNEL RELEASE  2012   left   CARPAL TUNNEL RELEASE Right 06/12/2014   Procedure: RIGHT CARPAL TUNNEL RELEASE ;  Surgeon: Wynonia Sours, MD;  Location: [place];  Service: Orthopedics;  Laterality: Right;   CATARACT EXTRACTION W/PHACO Left 10/13/2016   Procedure: CATARACT EXTRACTION PHACO AND INTRAOCULAR LENS PLACEMENT LEFT EYE  CDE=15.04;  Surgeon: Rutherford Guys, MD;  [facility];  Service: Ophthalmology;  Laterality: Left;  left   CATARACT EXTRACTION W/PHACO Right 11/03/2016   Procedure: CATARACT EXTRACTION PHACO AND INTRAOCULAR LENS PLACEMENT (IOC);  Surgeon: Rutherford Guys, MD;  [facility];  Service: Ophthalmology;  Laterality: Right;  CDE: 15.10   COLONOSCOPY     KNEE ARTHROSCOPY  1962   left   Patent ductus arteriosus repair     1951 - age 4   ULNAR NERVE TRANSPOSITION Right 06/12/2014   Procedure: DECOMPRESSION ULNAR NERVE RIGHT ELBOW;  Surgeon: Wynonia Sours, MD;  Location: [place];  Service: Orthopedics;  Laterality: Right;   VIDEO ASSISTED THORACOSCOPY (VATS)/THOROCOTOMY  8/14   left-guillian-barre    Current Outpatient Medications on File Prior to Visit  Medication Sig Dispense Refill   amLODipine (NORVASC) 5 MG tablet Take 5 mg by mouth daily.     aspirin 81 MG tablet Take 81 mg by mouth every evening.      ferrous sulfate 325 (65 FE) MG tablet Take 325 mg by mouth daily.     hydrochlorothiazide (HYDRODIURIL) 25 MG tablet Take 25 mg by mouth every morning.     latanoprost (XALATAN) 0.005 % ophthalmic solution Place 1 drop into both eyes at bedtime.     lisinopril (ZESTRIL) 20 MG tablet Take 20 mg by mouth daily.  metFORMIN (GLUCOPHAGE) 500 MG tablet Take 500 mg by mouth 2 (two) times daily with a meal.     metoprolol succinate (TOPROL XL) 25 MG 24 hr tablet Take 0.5 tablets (12.5 mg total) by mouth daily. 45 tablet 3   pyridOXINE (VITAMIN B-6) 100 MG tablet Take 100 mg by mouth daily.     simvastatin (ZOCOR) 20 MG tablet Take 20 mg by mouth at bedtime.     vitamin B-12 (CYANOCOBALAMIN) 1000 MCG tablet Take 1,000 mcg by mouth daily.     No current facility-administered medications on file prior to visit.    Social History   Socioeconomic History   Marital status: Married    Spouse name: Not on file   Number of children: Not on file   Years of education: Not on file    Highest education level: Not on file  Occupational History   Not on file  Tobacco Use   Smoking status: Never   Smokeless tobacco: Never  Vaping Use   Vaping Use: Never used  Substance and Sexual Activity   Alcohol use: Yes    Comment: Rare   Drug use: No   Sexual activity: Never    Birth control/protection: None  Other Topics Concern   Not on file  Social History Narrative   Not on file   Social Determinants of Health   Financial Resource Strain: Not on file  Food Insecurity: Not on file  Transportation Needs: Not on file  Physical Activity: Not on file  Stress: Not on file  Social Connections: Not on file  Intimate Partner Violence: Not on file    Family History  Problem Relation Age of Onset   Breast cancer Mother    Heart attack Father    Liver cancer Brother     BP (!) 167/77   Pulse 89   Ht '5\' 8"'$  (1.727 m)   Wt 192 lb (87.1 kg)   BMI 29.19 kg/m   Body mass index is 29.19 kg/m.      Objective:   Physical Exam Vitals and nursing note reviewed. Exam conducted with a chaperone present.  Constitutional:      Appearance: He is well-developed.  HENT:     Head: Normocephalic and atraumatic.  Eyes:     Conjunctiva/sclera: Conjunctivae normal.     Pupils: Pupils are equal, round, and reactive to light.  Cardiovascular:     Rate and Rhythm: Normal rate and regular rhythm.  Pulmonary:     Effort: Pulmonary effort is normal.  Abdominal:     Palpations: Abdomen is soft.  Musculoskeletal:     Cervical back: Normal range of motion and neck supple.       Legs:  Skin:    General: Skin is warm and dry.  Neurological:     Mental Status: He is alert and oriented to person, place, and time.     Cranial Nerves: No cranial nerve deficit.     Motor: No abnormal muscle tone.     Coordination: Coordination normal.     Deep Tendon Reflexes: Reflexes are normal and symmetric. Reflexes normal.  Psychiatric:        Behavior: Behavior normal.        Thought  Content: Thought content normal.        Judgment: Judgment normal.   X-rays were done of both knees, reported separately.        Assessment & Plan:   Encounter Diagnoses  Name Primary?   Chronic pain of  both knees Yes   Effusion, left knee    PROCEDURE NOTE:  The patient request injection, verbal consent was obtained.  The left knee was prepped appropriately after time out was performed.   Sterile technique was observed and anesthesia was provided by ethyl chloride and a 20-gauge needle was used to inject the knee area.  A 16-gauge needle was then used to aspirate the knee.  Color of fluid aspirated was straw  Total cc's aspirated was 55.    Injection of 1 cc of DepoMedrol 40 mg with several cc's of plain xylocaine was then performed.  A band aid dressing was applied.  The patient was advised to apply ice later today and tomorrow to the injection sight as needed.   PROCEDURE NOTE:  The patient request injection, verbal consent was obtained.  The right knee was prepped appropriately after time out was performed.   Sterile technique was observed and anesthesia was provided by ethyl chloride and a 20-gauge needle was used to inject the knee area.  A 16-gauge needle was then used to aspirate the knee.  Color of fluid aspirated was straw  Total cc's aspirated was 30.    Injection of 1 cc of DepoMedrol 40 mg with several cc's of plain xylocaine was then performed.  A band aid dressing was applied.  The patient was advised to apply ice later today and tomorrow to the injection sight as needed.   I will see him as needed.  Call if any problem.  Precautions discussed.  Electronically Signed Sanjuana Kava, MD 11/8/202311:20 AM

## 2022-10-28 NOTE — Patient Instructions (Signed)
As the weather changes and gets cooler, you may notice you are affected more. You may have more pain in your joints. This is normal. Dress warmly and make sure that area is covered well.   Aspercreme, Biofreeze, Blue Emu or Voltaren Gel over the counter 2-3 times daily. Rub into area well each use for best results.   ICE YOUR KNEES SEVERAL TIMES TODAY BECAUSE YOU HAD INJECTIONS. YOU MAY HAVE MORE DISCOMFORT TODAY/TONIGHT. THIS IS NORMAL BECAUSE HE HAS IRRITATED THOSE JOINTS. YOU SHOULD FEEL BETTER TOMORROW.   Dr.Keeling is here all day on Tuesdays, Wednesday mornings, and Thursday mornings. If you need anything such as a medication refill, please either call BEFORE the end of the day on [facility] or send a message through [place]. Your pharmacy can send a refill request for you. Calling by the end of the day on [facility] allows [place] time to send Dr.Keeling the request and for him to respond before he leaves on Thursdays.  If Dr. Luna Glasgow is out of the office, we may send it to one of the other providers and they may not refill it for the same amount that your original prescription is for.   MY NAME IS ABBY AND I AM DR.KEELING'S ASSISTANT. IF YOU NEED ANYTHING, PLEASE DO NOT HESITATE TO EITHER SEND ME A MESSAGE VIA MYCHART OR CALL THE OFFICE [phone] AND LEAVE A MESSAGE FOR ME. I WILL RESPOND WITHIN 24-48 BUSINESS HOURS.

## 2022-10-29 DIAGNOSIS — M25462 Effusion, left knee: Secondary | ICD-10-CM | POA: Diagnosis not present

## 2022-10-29 DIAGNOSIS — M25562 Pain in left knee: Secondary | ICD-10-CM | POA: Diagnosis not present

## 2022-10-29 DIAGNOSIS — G8929 Other chronic pain: Secondary | ICD-10-CM | POA: Diagnosis not present

## 2022-10-29 DIAGNOSIS — M25561 Pain in right knee: Secondary | ICD-10-CM | POA: Diagnosis not present

## 2022-10-29 MED ORDER — METHYLPREDNISOLONE ACETATE 40 MG/ML IJ SUSP
40.0000 mg | Freq: Once | INTRAMUSCULAR | Status: AC
  Administered 2022-10-29: 40 mg via INTRA_ARTICULAR

## 2022-10-29 NOTE — Addendum Note (Signed)
Addended by: Obie Dredge A on: 10/29/2022 07:26 AM   Modules accepted: Orders

## 2022-11-02 DIAGNOSIS — B351 Tinea unguium: Secondary | ICD-10-CM | POA: Diagnosis not present

## 2022-11-02 DIAGNOSIS — E1142 Type 2 diabetes mellitus with diabetic polyneuropathy: Secondary | ICD-10-CM | POA: Diagnosis not present

## 2022-11-03 DIAGNOSIS — H401132 Primary open-angle glaucoma, bilateral, moderate stage: Secondary | ICD-10-CM | POA: Diagnosis not present

## 2022-11-17 ENCOUNTER — Encounter: Payer: Self-pay | Admitting: Orthopaedic Surgery

## 2022-11-17 ENCOUNTER — Ambulatory Visit (INDEPENDENT_AMBULATORY_CARE_PROVIDER_SITE_OTHER): Payer: Medicare Other | Admitting: Orthopaedic Surgery

## 2022-11-17 DIAGNOSIS — M25462 Effusion, left knee: Secondary | ICD-10-CM

## 2022-11-17 MED ORDER — METHYLPREDNISOLONE ACETATE 40 MG/ML IJ SUSP
40.0000 mg | Freq: Once | INTRAMUSCULAR | Status: AC
  Administered 2022-11-17: 40 mg via INTRA_ARTICULAR

## 2022-11-17 NOTE — Progress Notes (Signed)
PROCEDURE NOTE:  The patient request injection, verbal consent was obtained.  The left knee was prepped appropriately after time out was performed.   Sterile technique was observed and anesthesia was provided by ethyl chloride and a 20-gauge needle was used to inject the knee area.  A 16-gauge needle was then used to aspirate the knee.  Color of fluid aspirated was blood tinged  Total cc's aspirated was 60.    Injection of 1 cc of DepoMedrol 40with several cc's of plain xylocaine was then performed.  A band aid dressing was applied.  The patient was advised to apply ice later today and tomorrow to the injection sight as needed.  Encounter Diagnosis  Name Primary?   Effusion, left knee Yes   Return as needed.  Call if any problem.  Precautions discussed.  Electronically Signed Sanjuana Kava, MD 11/28/202310:09 AM

## 2022-11-17 NOTE — Addendum Note (Signed)
Addended by: Obie Dredge A on: 11/17/2022 10:48 AM   Modules accepted: Orders

## 2022-12-03 ENCOUNTER — Encounter: Payer: Self-pay | Admitting: Orthopaedic Surgery

## 2022-12-03 ENCOUNTER — Ambulatory Visit (INDEPENDENT_AMBULATORY_CARE_PROVIDER_SITE_OTHER): Payer: Medicare Other | Admitting: Orthopaedic Surgery

## 2022-12-03 VITALS — BP 138/68 | HR 88

## 2022-12-03 DIAGNOSIS — M25462 Effusion, left knee: Secondary | ICD-10-CM

## 2022-12-03 DIAGNOSIS — M25562 Pain in left knee: Secondary | ICD-10-CM

## 2022-12-03 DIAGNOSIS — G8929 Other chronic pain: Secondary | ICD-10-CM | POA: Diagnosis not present

## 2022-12-03 DIAGNOSIS — M25561 Pain in right knee: Secondary | ICD-10-CM

## 2022-12-03 MED ORDER — METHYLPREDNISOLONE ACETATE 40 MG/ML IJ SUSP
40.0000 mg | Freq: Once | INTRAMUSCULAR | Status: AC
  Administered 2022-12-03: 40 mg via INTRA_ARTICULAR

## 2022-12-03 NOTE — Progress Notes (Signed)
PROCEDURE NOTE:  The patient request injection, verbal consent was obtained.  The left knee was prepped appropriately after time out was performed.   Sterile technique was observed and anesthesia was provided by ethyl chloride and a 20-gauge needle was used to inject the knee area.  A 16-gauge needle was then used to aspirate the knee.  Color of fluid aspirated was blood tinged  Total cc's aspirated was 30.    Injection of 1 cc of Celestone 6 mg with several cc's of plain xylocaine was then performed.  A band aid dressing was applied.  The patient was advised to apply ice later today and tomorrow to the injection sight as needed.   PROCEDURE NOTE:  The patient requests injections of the right knee , verbal consent was obtained.  The right knee was prepped appropriately after time out was performed.   Sterile technique was observed and injection of 1 cc of DepoMedrol '40mg'$  with several cc's of plain xylocaine. Anesthesia was provided by ethyl chloride and a 20-gauge needle was used to inject the knee area. The injection was tolerated well.  A band aid dressing was applied.  The patient was advised to apply ice later today and tomorrow to the injection sight as needed.  Encounter Diagnoses  Name Primary?   Chronic pain of both knees Yes   Effusion, left knee    Return prn.  Call if any problem.  Precautions discussed.  Electronically Signed Sanjuana Kava, MD 12/14/20238:30 AM

## 2022-12-03 NOTE — Addendum Note (Signed)
Addended by: Obie Dredge A on: 12/03/2022 08:55 AM   Modules accepted: Orders

## 2022-12-22 ENCOUNTER — Encounter: Payer: Self-pay | Admitting: Orthopaedic Surgery

## 2022-12-22 ENCOUNTER — Ambulatory Visit (INDEPENDENT_AMBULATORY_CARE_PROVIDER_SITE_OTHER): Payer: Medicare Other | Admitting: Orthopaedic Surgery

## 2022-12-22 DIAGNOSIS — M25462 Effusion, left knee: Secondary | ICD-10-CM

## 2022-12-22 DIAGNOSIS — I2510 Atherosclerotic heart disease of native coronary artery without angina pectoris: Secondary | ICD-10-CM | POA: Insufficient documentation

## 2022-12-22 MED ORDER — METHYLPREDNISOLONE ACETATE 40 MG/ML IJ SUSP
40.0000 mg | Freq: Once | INTRAMUSCULAR | Status: AC
  Administered 2022-12-22: 40 mg via INTRA_ARTICULAR

## 2022-12-22 NOTE — Progress Notes (Signed)
PROCEDURE NOTE:  The patient request injection, verbal consent was obtained.  The left knee was prepped appropriately after time out was performed.   Sterile technique was observed and anesthesia was provided by ethyl chloride and a 20-gauge needle was used to inject the knee area.  A 16-gauge needle was then used to aspirate the knee.  Color of fluid aspirated was straw  Total cc's aspirated was 50.    Injection of 1 cc of DepoMedrol 40 mg with several cc's of plain xylocaine was then performed.  A band aid dressing was applied.  The patient was advised to apply ice later today and tomorrow to the injection sight as needed.  Return as needed.  Call if any problem.  Precautions discussed.  Electronically Signed Sanjuana Kava, MD 1/2/20248:35 AM

## 2022-12-22 NOTE — Addendum Note (Signed)
Addended by: Obie Dredge A on: 12/22/2022 11:26 AM   Modules accepted: Orders

## 2023-01-07 ENCOUNTER — Ambulatory Visit (INDEPENDENT_AMBULATORY_CARE_PROVIDER_SITE_OTHER): Payer: Medicare Other | Admitting: Orthopaedic Surgery

## 2023-01-07 ENCOUNTER — Encounter: Payer: Self-pay | Admitting: Orthopaedic Surgery

## 2023-01-07 ENCOUNTER — Ambulatory Visit: Payer: Medicare Other | Admitting: Orthopaedic Surgery

## 2023-01-07 VITALS — Ht 68.0 in | Wt 192.0 lb

## 2023-01-07 DIAGNOSIS — M25462 Effusion, left knee: Secondary | ICD-10-CM | POA: Diagnosis not present

## 2023-01-07 NOTE — Progress Notes (Signed)
My knee is swollen again.  He has recurrent effusion of the left knee.  PROCEDURE NOTE:  The patient request injection, verbal consent was obtained.  The left knee was prepped appropriately after time out was performed.   Sterile technique was observed and anesthesia was provided by ethyl chloride and a 20-gauge needle was used to inject the knee area.  A 16-gauge needle was then used to aspirate the knee.  Color of fluid aspirated was blood tinged  Total cc's aspirated was 40.    Injection of 1 cc of DepoMedrol 40with several cc's of plain xylocaine was then performed.  A band aid dressing was applied.  The patient was advised to apply ice later today and tomorrow to the injection sight as needed.   Encounter Diagnosis  Name Primary?   Effusion, left knee Yes   Return in two weeks.  Call if any problem.  Precautions discussed.  Electronically Signed Sanjuana Kava, MD 1/18/20249:44 AM

## 2023-01-14 DIAGNOSIS — E1129 Type 2 diabetes mellitus with other diabetic kidney complication: Secondary | ICD-10-CM | POA: Diagnosis not present

## 2023-01-19 ENCOUNTER — Ambulatory Visit (INDEPENDENT_AMBULATORY_CARE_PROVIDER_SITE_OTHER): Payer: Medicare Other | Admitting: Orthopaedic Surgery

## 2023-01-19 ENCOUNTER — Encounter: Payer: Self-pay | Admitting: Orthopaedic Surgery

## 2023-01-19 DIAGNOSIS — M25462 Effusion, left knee: Secondary | ICD-10-CM

## 2023-01-19 NOTE — Progress Notes (Signed)
PROCEDURE NOTE:  The patient request injection, verbal consent was obtained.  The left knee was prepped appropriately after time out was performed.   Sterile technique was observed and anesthesia was provided by ethyl chloride and a 20-gauge needle was used to inject the knee area.  A 16-gauge needle was then used to aspirate the knee.  Color of fluid aspirated was straw  Total cc's aspirated was 40.    A band aid dressing was applied.  The patient was advised to apply ice later today and tomorrow to the injection sight as needed.   Encounter Diagnosis  Name Primary?   Effusion, left knee Yes   Return in two weeks.  Call if any problem.  Precautions discussed.  Electronically Signed Sanjuana Kava, MD 1/30/20248:26 AM

## 2023-01-26 ENCOUNTER — Encounter: Payer: Self-pay | Admitting: Orthopaedic Surgery

## 2023-01-26 ENCOUNTER — Ambulatory Visit (INDEPENDENT_AMBULATORY_CARE_PROVIDER_SITE_OTHER): Payer: Medicare Other | Admitting: Orthopaedic Surgery

## 2023-01-26 DIAGNOSIS — G8929 Other chronic pain: Secondary | ICD-10-CM | POA: Diagnosis not present

## 2023-01-26 DIAGNOSIS — M25562 Pain in left knee: Secondary | ICD-10-CM

## 2023-01-26 DIAGNOSIS — M25462 Effusion, left knee: Secondary | ICD-10-CM | POA: Diagnosis not present

## 2023-01-26 NOTE — Patient Instructions (Signed)
Central Scheduling [phone]  While we are working on your approval please go ahead and call to schedule your appointment to be done within one week. If you can not get an appointment at [facility] within the next week, ask if they have something sooner at [facility] or [place] if you are able to go to [place] to have the imaging done.  AFTER you have made your imaging appointment, please call our office back at [phone] to schedule an appointment to review your results.

## 2023-01-26 NOTE — Progress Notes (Addendum)
PROCEDURE NOTE:  The patient request injection, verbal consent was obtained.  The left knee was prepped appropriately after time out was performed.   Sterile technique was observed and anesthesia was provided by ethyl chloride and a 20-gauge needle was used to inject the knee area.  A 16-gauge needle was then used to aspirate the knee.  Color of fluid aspirated was straw  Total cc's aspirated was 55.    No steroid was injected  A band aid dressing was applied.  The patient was advised to apply ice later today and tomorrow to the injection sight as needed.   Encounter Diagnoses  Name Primary?   Effusion, left knee Yes   Chronic pain of left knee    He keeps getting effusions.  They hurt.  I will get MRI.  I will give knee sleeve.  Keep next week's appointment.  Call if any problem.  Precautions discussed.  Electronically Signed [name], MD 2/6/20248:26 AM  Patient walked into office, is obviously ambulatory.  He was given knee brace.  Electronically Signed [name], MD 6/13/202411:02 AM

## 2023-02-02 ENCOUNTER — Ambulatory Visit: Payer: Medicare Other | Admitting: Orthopaedic Surgery

## 2023-02-02 ENCOUNTER — Ambulatory Visit (INDEPENDENT_AMBULATORY_CARE_PROVIDER_SITE_OTHER): Payer: Medicare Other | Admitting: Orthopaedic Surgery

## 2023-02-02 ENCOUNTER — Encounter: Payer: Self-pay | Admitting: Orthopaedic Surgery

## 2023-02-02 DIAGNOSIS — G8929 Other chronic pain: Secondary | ICD-10-CM

## 2023-02-02 DIAGNOSIS — M25562 Pain in left knee: Secondary | ICD-10-CM

## 2023-02-02 DIAGNOSIS — M25462 Effusion, left knee: Secondary | ICD-10-CM | POA: Diagnosis not present

## 2023-02-02 NOTE — Progress Notes (Signed)
PROCEDURE NOTE:  The patient request injection, verbal consent was obtained.  The left knee was prepped appropriately after time out was performed.   Sterile technique was observed and anesthesia was provided by ethyl chloride and a 20-gauge needle was used to inject the knee area.  A 16-gauge needle was then used to aspirate the knee.  Color of fluid aspirated was straw  Total cc's aspirated was 45.    Injection of 1 cc of DepoMedrol 40 with several cc's of plain xylocaine was then performed.  A band aid dressing was applied.  The patient was advised to apply ice later today and tomorrow to the injection sight as needed.   Encounter Diagnoses  Name Primary?   Effusion, left knee Yes   Chronic pain of left knee    He has MRI scheduled for February 26.  Call if any problem.  Precautions discussed.  Electronically Signed Sanjuana Kava, MD 2/13/20249:02 AM

## 2023-02-09 ENCOUNTER — Ambulatory Visit (INDEPENDENT_AMBULATORY_CARE_PROVIDER_SITE_OTHER): Payer: Self-pay | Admitting: Orthopaedic Surgery

## 2023-02-09 ENCOUNTER — Encounter: Payer: Self-pay | Admitting: Orthopaedic Surgery

## 2023-02-09 DIAGNOSIS — M25462 Effusion, left knee: Secondary | ICD-10-CM

## 2023-02-09 NOTE — Progress Notes (Signed)
He has minimal effusion today.  He has MRI for 02-15-23, I will see the next day.  No treatment, no charge.  Electronically Signed Sanjuana Kava, MD 2/20/20248:25 AM

## 2023-02-15 ENCOUNTER — Ambulatory Visit (HOSPITAL_COMMUNITY)
Admission: RE | Admit: 2023-02-15 | Discharge: 2023-02-15 | Disposition: A | Discharge status: Home / Self Care | Payer: Medicare Other | Source: Ambulatory Visit | Attending: Orthopaedic Surgery | Admitting: Orthopaedic Surgery

## 2023-02-15 DIAGNOSIS — G8929 Other chronic pain: Secondary | ICD-10-CM

## 2023-02-15 DIAGNOSIS — R600 Localized edema: Secondary | ICD-10-CM | POA: Diagnosis not present

## 2023-02-15 DIAGNOSIS — M25462 Effusion, left knee: Secondary | ICD-10-CM | POA: Diagnosis not present

## 2023-02-15 DIAGNOSIS — M25562 Pain in left knee: Secondary | ICD-10-CM | POA: Diagnosis not present

## 2023-02-18 ENCOUNTER — Encounter: Payer: Self-pay | Admitting: Orthopaedic Surgery

## 2023-02-18 ENCOUNTER — Ambulatory Visit (INDEPENDENT_AMBULATORY_CARE_PROVIDER_SITE_OTHER): Payer: Medicare Other | Admitting: Orthopaedic Surgery

## 2023-02-18 VITALS — Ht 68.0 in | Wt 192.0 lb

## 2023-02-18 DIAGNOSIS — M25562 Pain in left knee: Secondary | ICD-10-CM

## 2023-02-18 DIAGNOSIS — G8929 Other chronic pain: Secondary | ICD-10-CM

## 2023-02-18 NOTE — Progress Notes (Signed)
My knee is a little better.  He had MRI of the left knee showing: IMPRESSION: 1. Severely diminutive medial meniscus compatible with prominent partial meniscectomy in the past. The remaining orthotopic portion of the medial meniscus is diminutive and irregular with degenerative accentuated signal and poor definition of meniscal margins especially approaching the meniscal root. 2. 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 3. Expansion and accentuated T2 signal in the anterior cruciate ligament favoring substantial degeneration over partial tear or ACL cyst. Similarly there is suspected mucoid degeneration centrally throughout the posterior cruciate ligament. 4. Thickened proximal MCL without edema, potentially from remote injury. Adjacent marrow edema near the proximal attachment site in the medial femoral condyle. 5. Proximal popliteus tendinopathy. 6. Moderate to severe osteoarthritis especially in the medial compartment. 7. Moderate knee joint effusion with associated synovitis. Small Baker's cyst. 8. Mildly complex cystic lesions posterior to the joint capsule deep to the medial and lateral head gastrocnemius musculature. Ganglion cysts not excluded. 9. 1.0 cm septated cystic lesion along the anterior proximal tibiofibular articulation, probably a ganglion cyst. 10. Thickened medial patellar retinaculum. 11. Thickened distal iliotibial band near its distal attachment.  I have explained the findings to him.  He is a candidate for a total knee but his age is a limiting factor.  I have independently reviewed the MRI.    He has slight effusion of the left knee today.  ROM 0 to 105 with no pain.  Positive medial McMurray.  Limp left, uses a cane.  NV intact.  Encounter Diagnosis  Name Primary?   Chronic pain of left knee Yes   I will see him prn to aspirate the knee as needed.  Continue the knee brace.  Call if  any problem.  Precautions discussed.  Electronically Signed Sanjuana Kava, MD 2/29/20248:34 AM

## 2023-02-19 ENCOUNTER — Other Ambulatory Visit (HOSPITAL_COMMUNITY): Payer: Medicare Other

## 2023-02-28 NOTE — Progress Notes (Signed)
Cardiology Office Note  Date: 03/01/2023   ID: [name], DOB 02-Oct-1929, MRN [id number]  History of Present Illness: [name] is a 89 y.o. male last seen in September 2023.  He is here for a routine visit.  Reports NYHA class II dyspnea, no palpitations or syncope.  Has had trouble with his balance and prone to falls, now using a cane.  I reviewed his medications which are stable from a cardiac perspective.  Reports no intolerances to Zocor, his last LDL was 54.  Blood pressure control is reasonable today.  He continues to follow with Dr. Willey Blade.  I personally reviewed his echocardiogram done earlier this morning and noted below.  Physical Exam: VS:  BP 138/70 (BP Location: Left Arm, Patient Position: Sitting, Cuff Size: Normal)   Pulse 72   Ht '5\' 8"'$  (1.727 m)   Wt 194 lb (88 kg)   SpO2 97%   BMI 29.50 kg/m , BMI Body mass index is 29.5 kg/m.  Wt Readings from Last 3 Encounters:  03/01/23 194 lb (88 kg)  02/18/23 192 lb (87.1 kg)  01/07/23 192 lb (87.1 kg)    General: Patient appears comfortable at rest. HEENT: Conjunctiva and lids normal. Neck: Supple, no elevated JVP or carotid bruits. Lungs: Clear to auscultation, nonlabored breathing at rest. Cardiac: Regular rate and rhythm, no S3, 2/6 systolic murmur, no diastolic murmur. Extremities: No pitting edema.  ECG:  An ECG dated 08/21/2022 was personally reviewed today and demonstrated:  Sinus rhythm with prolonged PR interval, IVCD with decreased R wave progression, PACs.  Labwork:  September 2023: Hemoglobin 12.6, platelets 258, BUN 21, creatinine 1.27, potassium 5.0, AST 15, ALT 13, cholesterol 133, triglycerides 60, HDL 66, LDL 54 January 2024: Hemoglobin A1c 7.1%  Other Studies Reviewed Today:  Echocardiogram 03/01/2023:  1. Left ventricular ejection fraction, by estimation, is 65 to 70%. The  left ventricle has normal function. The left ventricle has no regional  wall motion abnormalities.  There is moderate asymmetric left ventricular  hypertrophy of the basal segment.  Left ventricular diastolic parameters are consistent with Grade II  diastolic dysfunction (pseudonormalization).   2. Right ventricular systolic function is normal. The right ventricular  size is normal. There is mildly elevated pulmonary artery systolic  pressure. The estimated right ventricular systolic pressure is Q000111Q mmHg.   3. Left atrial size was mildly dilated.   4. Right atrial size was mildly dilated.   5. The mitral valve is degenerative. Mild mitral valve regurgitation.   6. Tricuspid valve regurgitation is mild to moderate.   7. The aortic valve has been repaired/replaced. Aortic valve  regurgitation is mild. There is a 34 mm stented (TAVR) valve present in  the aortic position. Aortic valve mean gradient measures 7.0 mmHg.   8. The inferior vena cava is normal in size with greater than 50%  respiratory variability, suggesting right atrial pressure of 3 mmHg.   Comparison(s): Prior images reviewed side by side. LVEF remains normal  range at 65-70%. TAVR with relatively stable function, mean AV gradient 7  mmHg (previously 5 mmHg) and mild paravalvular regurgitation.   Assessment and Plan:  1.  History of severe calcific aortic stenosis status post TAVR at [facility] in 2019.  Last echocardiogram was in February 2023 at which point 34 mm Medtronic TAVR showed grossly normal function with mean AV gradient 5 mmHg and mild paravalvular regurgitation.  Follow-up echocardiogram done earlier this morning shows relatively stable prosthetic function, mean AV gradient  up to 7 mmHg and mild paravalvular regurgitation.  Continue aspirin.  2.  CAD status post DES to the ostial RCA in October 2019.  He does not report any active angina at this time.  Continue observation on medical therapy including aspirin, Toprol-XL, Norvasc, lisinopril, and Zocor.  Last LDL was 54.  3.  CKD stage IIIb, creatinine 1.27 with normal  potassium in September 2023.  Disposition:  Follow up  6 months.  Signed, Satira Sark, [name], F.A.C.C.

## 2023-03-01 ENCOUNTER — Encounter: Payer: Self-pay | Admitting: Cardiology

## 2023-03-01 ENCOUNTER — Ambulatory Visit (INDEPENDENT_AMBULATORY_CARE_PROVIDER_SITE_OTHER): Payer: Medicare Other | Admitting: Cardiology

## 2023-03-01 ENCOUNTER — Ambulatory Visit (HOSPITAL_COMMUNITY)
Admission: RE | Admit: 2023-03-01 | Discharge: 2023-03-01 | Disposition: A | Discharge status: Home / Self Care | Payer: Medicare Other | Source: Ambulatory Visit | Attending: Cardiology | Admitting: Cardiology

## 2023-03-01 ENCOUNTER — Telehealth: Payer: Self-pay

## 2023-03-01 VITALS — BP 138/70 | HR 72 | Ht 68.0 in | Wt 194.0 lb

## 2023-03-01 DIAGNOSIS — N1832 Chronic kidney disease, stage 3b: Secondary | ICD-10-CM | POA: Diagnosis not present

## 2023-03-01 DIAGNOSIS — Z953 Presence of xenogenic heart valve: Secondary | ICD-10-CM | POA: Diagnosis not present

## 2023-03-01 DIAGNOSIS — I25119 Atherosclerotic heart disease of native coronary artery with unspecified angina pectoris: Secondary | ICD-10-CM

## 2023-03-01 LAB — ECHOCARDIOGRAM COMPLETE
AR max vel: 1.73 cm2
AV Area VTI: 1.89 cm2
AV Area mean vel: 1.72 cm2
AV Mean grad: 7 mmHg
AV Peak grad: 12.7 mmHg
Ao pk vel: 1.78 m/s
Area-P 1/2: 3.77 cm2
P 1/2 time: 368 msec
S' Lateral: 2.6 cm

## 2023-03-01 NOTE — Patient Instructions (Signed)
Medication Instructions:  Your physician recommends that you continue on your current medications as directed. Please refer to the Current Medication list given to you today.   Labwork: None  Testing/Procedures: None  Follow-Up: Follow up with Dr. McDowell in 6 months.   Any Other Special Instructions Will Be Listed Below (If Applicable).     If you need a refill on your cardiac medications before your next appointment, please call your pharmacy.  

## 2023-03-01 NOTE — Telephone Encounter (Signed)
Patient notified and verbalized understanding. Patient had no questions or concerns at this time. PCP copied 

## 2023-03-01 NOTE — Progress Notes (Signed)
*  PRELIMINARY RESULTS* Echocardiogram 2D Echocardiogram has been performed.  [name] 03/01/2023, 10:16 AM

## 2023-03-01 NOTE — Telephone Encounter (Signed)
-----   Message from Satira Sark, MD sent at 03/01/2023  1:00 PM EDT ----- Results reviewed. LVEF normal range and TAVR function is relatively stable as well. No change to current plan.

## 2023-03-08 DIAGNOSIS — E1151 Type 2 diabetes mellitus with diabetic peripheral angiopathy without gangrene: Secondary | ICD-10-CM | POA: Diagnosis not present

## 2023-03-08 DIAGNOSIS — B351 Tinea unguium: Secondary | ICD-10-CM | POA: Diagnosis not present

## 2023-03-08 DIAGNOSIS — E1142 Type 2 diabetes mellitus with diabetic polyneuropathy: Secondary | ICD-10-CM | POA: Diagnosis not present

## 2023-03-23 ENCOUNTER — Ambulatory Visit (INDEPENDENT_AMBULATORY_CARE_PROVIDER_SITE_OTHER): Payer: Medicare Other | Admitting: Orthopaedic Surgery

## 2023-03-23 ENCOUNTER — Encounter: Payer: Self-pay | Admitting: Orthopaedic Surgery

## 2023-03-23 DIAGNOSIS — M25562 Pain in left knee: Secondary | ICD-10-CM

## 2023-03-23 DIAGNOSIS — M25462 Effusion, left knee: Secondary | ICD-10-CM

## 2023-03-23 DIAGNOSIS — G8929 Other chronic pain: Secondary | ICD-10-CM

## 2023-03-23 MED ORDER — METHYLPREDNISOLONE ACETATE 40 MG/ML IJ SUSP
40.0000 mg | Freq: Once | INTRAMUSCULAR | Status: AC
  Administered 2023-03-23: 40 mg via INTRA_ARTICULAR

## 2023-03-23 NOTE — Progress Notes (Signed)
PROCEDURE NOTE:  The patient request injection, verbal consent was obtained.  The left knee was prepped appropriately after time out was performed.   Sterile technique was observed and anesthesia was provided by ethyl chloride and a 20-gauge needle was used to inject the knee area.  A 16-gauge needle was then used to aspirate the knee.  Color of fluid aspirated was straw  Total cc's aspirated was 45.    Injection of 1 cc of DepoMedrol 40 with several cc's of plain xylocaine was then performed.  A band aid dressing was applied.  The patient was advised to apply ice later today and tomorrow to the injection sight as needed.   Encounter Diagnoses  Name Primary?   Chronic pain of left knee Yes   Effusion, left knee    Return prn.  Call if any problem.  Precautions discussed.  Electronically Signed Sanjuana Kava, MD 4/2/202410:07 AM

## 2023-03-23 NOTE — Addendum Note (Signed)
Addended byCandice Camp on: 03/23/2023 10:10 AM   Modules accepted: Orders

## 2023-04-06 ENCOUNTER — Encounter: Payer: Self-pay | Admitting: Orthopaedic Surgery

## 2023-04-06 ENCOUNTER — Ambulatory Visit (INDEPENDENT_AMBULATORY_CARE_PROVIDER_SITE_OTHER): Payer: Medicare Other | Admitting: Orthopaedic Surgery

## 2023-04-06 DIAGNOSIS — G8929 Other chronic pain: Secondary | ICD-10-CM

## 2023-04-06 DIAGNOSIS — M25462 Effusion, left knee: Secondary | ICD-10-CM | POA: Diagnosis not present

## 2023-04-06 DIAGNOSIS — M25562 Pain in left knee: Secondary | ICD-10-CM | POA: Diagnosis not present

## 2023-04-06 MED ORDER — METHYLPREDNISOLONE ACETATE 40 MG/ML IJ SUSP
40.0000 mg | Freq: Once | INTRAMUSCULAR | Status: AC
Start: 2023-04-06 — End: 2023-04-06
  Administered 2023-04-06: 40 mg via INTRA_ARTICULAR

## 2023-04-06 NOTE — Progress Notes (Signed)
He got up on tractor for two days in a row then cut wood.  He has new effusion of the left knee.  PROCEDURE NOTE:  The patient request injection, verbal consent was obtained.  The left knee was prepped appropriately after time out was performed.   Sterile technique was observed and anesthesia was provided by ethyl chloride and a 20-gauge needle was used to inject the knee area.  A 16-gauge needle was then used to aspirate the knee.  Color of fluid aspirated was blood tinged  Total cc's aspirated was 30.    Injection of 1 cc of DepoMedrol 40 mg with several cc's of plain xylocaine was then performed.  A band aid dressing was applied.  The patient was advised to apply ice later today and tomorrow to the injection sight as needed.   Encounter Diagnoses  Name Primary?   Chronic pain of left knee Yes   Effusion, left knee    Return prn.  Call if any problem.  Precautions discussed.  Electronically Signed [name], MD 4/16/20249:51 AM

## 2023-04-06 NOTE — Addendum Note (Signed)
Addended by: Recardo Evangelist A on: 04/06/2023 09:54 AM   Modules accepted: Orders

## 2023-04-21 ENCOUNTER — Encounter: Payer: Self-pay | Admitting: Orthopedic Surgery

## 2023-04-21 ENCOUNTER — Ambulatory Visit (INDEPENDENT_AMBULATORY_CARE_PROVIDER_SITE_OTHER): Payer: Medicare Other | Admitting: Orthopedic Surgery

## 2023-04-21 DIAGNOSIS — M25562 Pain in left knee: Secondary | ICD-10-CM

## 2023-04-21 DIAGNOSIS — M25462 Effusion, left knee: Secondary | ICD-10-CM | POA: Diagnosis not present

## 2023-04-21 DIAGNOSIS — G8929 Other chronic pain: Secondary | ICD-10-CM

## 2023-04-21 NOTE — Patient Instructions (Signed)
Instructions Following Joint Injections  In clinic today, you received an aspiration of one of your joints (sometimes more than one).  Occasionally, you can have some pain at the aspiration site, this is normal.  You can place ice at the injection site, or take over-the-counter medications such as Tylenol (acetaminophen) or Advil (ibuprofen).  Please follow all directions listed on the bottle.  If your joint (knee or shoulder) becomes swollen, red or very painful, please contact the clinic for additional assistance.

## 2023-04-21 NOTE — Progress Notes (Signed)
Orthopaedic Clinic Return  Assessment: [name] is a 89 y.o. male with the following: Advanced left knee arthritis, chronic knee pain and recurrent effusions   Plan: [name] was seen in clinic today.  He is a patient of Dr. Sanjuan Dame.  He has advanced degenerative changes in the left knee.  He also has recurrent knee effusions.  He was seen last week, and had an aspiration, as well as a steroid injection.  The fluid has reaccumulated.  He presented wanting an aspiration.  This was completed in clinic today.  He will follow-up as needed.  Procedure note - aspiration left knee joint   Verbal consent was obtained to aspirate the left knee joint  Timeout was completed to confirm the site of aspiration. The skin was prepped with alcohol and ethyl chloride was sprayed at the aspiration site.  An 18-gauge needle was inserted and 35 cc of clear joint fluid was aspirated using a superolateral approach.  There were no complications. A sterile bandage was applied.   Follow-up: Return if symptoms worsen or fail to improve.   Subjective:  Chief Complaint  Patient presents with   Knee Problem    Left/ wants fluid aspirated     History of Present Illness: [name] is a 89 y.o. male who returns to clinic for evaluation of left knee.  He is a longtime patient of Dr. Hilda Lias.  He has advanced degenerative changes in the left knee.  He has been having recurrent effusions.  He was seen last week, at which time his left knee was aspirated, as well as injected.  Since then, the fluid has reaccumulated.  This restricts his motion.  It also affects his ability to ambulate.  Review of Systems: No fevers or chills No numbness or tingling No chest pain No shortness of breath No bowel or bladder dysfunction No GI distress No headaches   Objective: There were no vitals taken for this visit.  Physical Exam:  Elderly male.  No acute distress.  Ambulates with the  assistance of a cane.  Evaluation left knee demonstrates no redness.  Moderate effusion.  Restricted range of motion from 10-90 degrees.  No increased laxity varus valgus stress.  Negative Lachman.  IMAGING: No new imaging obtained today.   Oliver Barre, MD 04/21/2023 11:46 AM

## 2023-05-06 DIAGNOSIS — Z961 Presence of intraocular lens: Secondary | ICD-10-CM | POA: Diagnosis not present

## 2023-05-06 DIAGNOSIS — H401132 Primary open-angle glaucoma, bilateral, moderate stage: Secondary | ICD-10-CM | POA: Diagnosis not present

## 2023-05-06 DIAGNOSIS — E119 Type 2 diabetes mellitus without complications: Secondary | ICD-10-CM | POA: Diagnosis not present

## 2023-05-11 ENCOUNTER — Encounter: Payer: Self-pay | Admitting: Orthopaedic Surgery

## 2023-05-11 ENCOUNTER — Ambulatory Visit (INDEPENDENT_AMBULATORY_CARE_PROVIDER_SITE_OTHER): Payer: Medicare Other | Admitting: Orthopaedic Surgery

## 2023-05-11 DIAGNOSIS — M25562 Pain in left knee: Secondary | ICD-10-CM

## 2023-05-11 DIAGNOSIS — G8929 Other chronic pain: Secondary | ICD-10-CM

## 2023-05-11 DIAGNOSIS — M25462 Effusion, left knee: Secondary | ICD-10-CM | POA: Diagnosis not present

## 2023-05-11 MED ORDER — METHYLPREDNISOLONE ACETATE 40 MG/ML IJ SUSP
40.0000 mg | Freq: Once | INTRAMUSCULAR | Status: AC
  Administered 2023-05-11: 40 mg via INTRA_ARTICULAR

## 2023-05-11 NOTE — Addendum Note (Signed)
Addended byCaffie Damme on: 05/11/2023 08:52 AM   Modules accepted: Orders

## 2023-05-11 NOTE — Progress Notes (Signed)
He has new effusion of the left knee.  He has no trauma.  PROCEDURE NOTE:  The patient request injection, verbal consent was obtained.  The left knee was prepped appropriately after time out was performed.   Sterile technique was observed and anesthesia was provided by ethyl chloride and a 20-gauge needle was used to inject the knee area.  A 16-gauge needle was then used to aspirate the knee.  Color of fluid aspirated was straw  Total cc's aspirated was 32.    Injection of 1 cc of DepoMedrol 40 mg with several cc's of plain xylocaine was then performed.  A band aid dressing was applied.  The patient was advised to apply ice later today and tomorrow to the injection sight as needed.   Encounter Diagnoses  Name Primary?   Chronic pain of left knee Yes   Effusion, left knee    Return prn.  Call if any problem.  Precautions discussed.  Electronically Signed [name], MD 5/21/20248:17 AM

## 2023-05-12 DIAGNOSIS — E1129 Type 2 diabetes mellitus with other diabetic kidney complication: Secondary | ICD-10-CM | POA: Diagnosis not present

## 2023-05-19 DIAGNOSIS — R7309 Other abnormal glucose: Secondary | ICD-10-CM | POA: Diagnosis not present

## 2023-05-19 DIAGNOSIS — I10 Essential (primary) hypertension: Secondary | ICD-10-CM | POA: Diagnosis not present

## 2023-05-19 DIAGNOSIS — I350 Nonrheumatic aortic (valve) stenosis: Secondary | ICD-10-CM | POA: Diagnosis not present

## 2023-05-19 DIAGNOSIS — E1122 Type 2 diabetes mellitus with diabetic chronic kidney disease: Secondary | ICD-10-CM | POA: Diagnosis not present

## 2023-06-08 ENCOUNTER — Ambulatory Visit (INDEPENDENT_AMBULATORY_CARE_PROVIDER_SITE_OTHER): Payer: Medicare Other | Admitting: Orthopaedic Surgery

## 2023-06-08 ENCOUNTER — Encounter: Payer: Self-pay | Admitting: Orthopaedic Surgery

## 2023-06-08 DIAGNOSIS — G8929 Other chronic pain: Secondary | ICD-10-CM | POA: Diagnosis not present

## 2023-06-08 DIAGNOSIS — M25562 Pain in left knee: Secondary | ICD-10-CM | POA: Diagnosis not present

## 2023-06-08 DIAGNOSIS — M25462 Effusion, left knee: Secondary | ICD-10-CM

## 2023-06-08 MED ORDER — METHYLPREDNISOLONE ACETATE 40 MG/ML IJ SUSP
40.0000 mg | Freq: Once | INTRAMUSCULAR | Status: AC
  Administered 2023-06-08: 40 mg via INTRA_ARTICULAR

## 2023-06-08 NOTE — Progress Notes (Signed)
PROCEDURE NOTE:  The patient request injection, verbal consent was obtained.  The left knee was prepped appropriately after time out was performed.   Sterile technique was observed and anesthesia was provided by ethyl chloride and a 20-gauge needle was used to inject the knee area.  A 16-gauge needle was then used to aspirate the knee.  Color of fluid aspirated was blood tinged  Total cc's aspirated was 40.    Injection of 1 cc of DepoMedrol 40 mg with several cc's of plain xylocaine was then performed.  A band aid dressing was applied.  The patient was advised to apply ice later today and tomorrow to the injection sight as needed.  Encounter Diagnoses  Name Primary?   Chronic pain of left knee Yes   Effusion, left knee    Return prn.  Call if any problem.  Precautions discussed.  Electronically Signed [name], MD 6/18/20248:16 AM

## 2023-06-08 NOTE — Addendum Note (Signed)
Addended by: Shareen Capwell W on: 06/08/2023 09:34 AM   Modules accepted: Orders  

## 2023-06-29 ENCOUNTER — Encounter: Payer: Self-pay | Admitting: Orthopaedic Surgery

## 2023-06-29 ENCOUNTER — Ambulatory Visit (INDEPENDENT_AMBULATORY_CARE_PROVIDER_SITE_OTHER): Payer: Medicare Other | Admitting: Orthopaedic Surgery

## 2023-06-29 DIAGNOSIS — G8929 Other chronic pain: Secondary | ICD-10-CM | POA: Diagnosis not present

## 2023-06-29 DIAGNOSIS — M25462 Effusion, left knee: Secondary | ICD-10-CM | POA: Diagnosis not present

## 2023-06-29 DIAGNOSIS — M25562 Pain in left knee: Secondary | ICD-10-CM

## 2023-06-29 MED ORDER — METHYLPREDNISOLONE ACETATE 40 MG/ML IJ SUSP
40.0000 mg | Freq: Once | INTRAMUSCULAR | Status: AC
  Administered 2023-06-29: 40 mg via INTRA_ARTICULAR

## 2023-06-29 NOTE — Progress Notes (Signed)
PROCEDURE NOTE:  The patient request injection, verbal consent was obtained.  The left knee was prepped appropriately after time out was performed.   Sterile technique was observed and anesthesia was provided by ethyl chloride and a 20-gauge needle was used to inject the knee area.  A 16-gauge needle was then used to aspirate the knee.  Color of fluid aspirated was straw  Total cc's aspirated was 42.    Injection of 1 cc of DepoMedrol 40mg  with several cc's of plain xylocaine was then performed.  A band aid dressing was applied.  The patient was advised to apply ice later today and tomorrow to the injection sight as needed.  Encounter Diagnoses  Name Primary?   Chronic pain of left knee Yes   Effusion, left knee    Return prn.  Call if any problem.  Precautions discussed.  Electronically Signed [name], MD 7/9/20249:30 AM

## 2023-06-29 NOTE — Addendum Note (Signed)
Addended by: Michaele Offer on: 06/29/2023 09:53 AM   Modules accepted: Orders

## 2023-07-05 DIAGNOSIS — E1151 Type 2 diabetes mellitus with diabetic peripheral angiopathy without gangrene: Secondary | ICD-10-CM | POA: Diagnosis not present

## 2023-07-05 DIAGNOSIS — B351 Tinea unguium: Secondary | ICD-10-CM | POA: Diagnosis not present

## 2023-07-05 DIAGNOSIS — E1142 Type 2 diabetes mellitus with diabetic polyneuropathy: Secondary | ICD-10-CM | POA: Diagnosis not present

## 2023-07-21 ENCOUNTER — Ambulatory Visit (INDEPENDENT_AMBULATORY_CARE_PROVIDER_SITE_OTHER): Payer: Medicare Other | Admitting: Orthopaedic Surgery

## 2023-07-21 ENCOUNTER — Encounter: Payer: Self-pay | Admitting: Orthopaedic Surgery

## 2023-07-21 VITALS — Ht 68.0 in | Wt 194.0 lb

## 2023-07-21 DIAGNOSIS — M25562 Pain in left knee: Secondary | ICD-10-CM

## 2023-07-21 DIAGNOSIS — G8929 Other chronic pain: Secondary | ICD-10-CM | POA: Diagnosis not present

## 2023-07-21 DIAGNOSIS — M25462 Effusion, left knee: Secondary | ICD-10-CM

## 2023-07-21 NOTE — Progress Notes (Signed)
PROCEDURE NOTE:  The patient request injection, verbal consent was obtained.  The left knee was prepped appropriately after time out was performed.   Sterile technique was observed and anesthesia was provided by ethyl chloride and a 20-gauge needle was used to inject the knee area.  A 16-gauge needle was then used to aspirate the knee.  Color of fluid aspirated was straw  Total cc's aspirated was 48.    Injection of 1 cc of DepoMedrol 40 with several cc's of plain xylocaine was then performed.  A band aid dressing was applied.  The patient was advised to apply ice later today and tomorrow to the injection sight as needed.   Encounter Diagnoses  Name Primary?   Chronic pain of left knee Yes   Effusion, left knee    Return in one month.  Call if any problem.  Precautions discussed.  Electronically Signed [name], MD 7/31/20248:45 AM

## 2023-08-17 ENCOUNTER — Ambulatory Visit: Payer: Medicare Other | Admitting: Orthopaedic Surgery

## 2023-08-18 ENCOUNTER — Ambulatory Visit (INDEPENDENT_AMBULATORY_CARE_PROVIDER_SITE_OTHER): Payer: Medicare Other | Admitting: Orthopaedic Surgery

## 2023-08-18 ENCOUNTER — Encounter: Payer: Self-pay | Admitting: Orthopaedic Surgery

## 2023-08-18 DIAGNOSIS — M25562 Pain in left knee: Secondary | ICD-10-CM | POA: Diagnosis not present

## 2023-08-18 DIAGNOSIS — M25462 Effusion, left knee: Secondary | ICD-10-CM

## 2023-08-18 DIAGNOSIS — G8929 Other chronic pain: Secondary | ICD-10-CM | POA: Diagnosis not present

## 2023-08-18 NOTE — Progress Notes (Signed)
PROCEDURE NOTE:  The patient request injection, verbal consent was obtained.  The left knee was prepped appropriately after time out was performed.   Sterile technique was observed and anesthesia was provided by ethyl chloride and a 20-gauge needle was used to inject the knee area.  A 16-gauge needle was then used to aspirate the knee.  Color of fluid aspirated was blood tinged  Total cc's aspirated was 34.    Injection of 1 cc of DepoMedrol 40mg  with several cc's of plain xylocaine was then performed.  A band aid dressing was applied.  The patient was advised to apply ice later today and tomorrow to the injection sight as needed.  Encounter Diagnoses  Name Primary?   Chronic pain of left knee Yes   Effusion, left knee    Return prn.  Call if any problem.  Precautions discussed.  Electronically Signed [name], MD 8/28/20248:39 AM

## 2023-09-03 NOTE — Progress Notes (Deleted)
Cardiology Office Note  Date: 09/03/2023   ID: [name], DOB [birth year], MRN [id number]  History of Present Illness: [name] is a 89 y.o. male last seen in March.  Physical Exam: VS:  There were no vitals taken for this visit., BMI There is no height or weight on file to calculate BMI.  Wt Readings from Last 3 Encounters:  07/21/23 194 lb (88 kg)  03/01/23 194 lb (88 kg)  02/18/23 192 lb (87.1 kg)    General: Patient appears comfortable at rest. HEENT: Conjunctiva and lids normal, oropharynx clear with moist mucosa. Neck: Supple, no elevated JVP or carotid bruits, no thyromegaly. Lungs: Clear to auscultation, nonlabored breathing at rest. Cardiac: Regular rate and rhythm, no S3 or significant systolic murmur, no pericardial rub. Abdomen: Soft, nontender, no hepatomegaly, bowel sounds present, no guarding or rebound. Extremities: No pitting edema, distal pulses 2+. Skin: Warm and dry. Musculoskeletal: No kyphosis. Neuropsychiatric: Alert and oriented x3, affect grossly appropriate.  ECG:  An ECG dated 08/21/2022 was personally reviewed today and demonstrated:  Sinus rhythm with prolonged PR interval, IVCD with decreased R wave progression, PACs.  Labwork:  September 2023: Hemoglobin 12.6, platelets 258, BUN 21, creatinine 1.27, potassium 5, AST 15, ALT 13, cholesterol 133, triglycerides 60, HDL 66, LDL 54 May 2024: Hemoglobin A1c 7.2%  Other Studies Reviewed Today:  Echocardiogram 03/01/2023:  1. Left ventricular ejection fraction, by estimation, is 65 to 70%. The  left ventricle has normal function. The left ventricle has no regional  wall motion abnormalities. There is moderate asymmetric left ventricular  hypertrophy of the basal segment.  Left ventricular diastolic parameters are consistent with Grade II  diastolic dysfunction (pseudonormalization).   2. Right ventricular systolic function is normal. The right ventricular  size is normal. There  is mildly elevated pulmonary artery systolic  pressure. The estimated right ventricular systolic pressure is 40.2 mmHg.   3. Left atrial size was mildly dilated.   4. Right atrial size was mildly dilated.   5. The mitral valve is degenerative. Mild mitral valve regurgitation.   6. Tricuspid valve regurgitation is mild to moderate.   7. The aortic valve has been repaired/replaced. Aortic valve  regurgitation is mild. There is a 34 mm stented (TAVR) valve present in  the aortic position. Aortic valve mean gradient measures 7.0 mmHg.   8. The inferior vena cava is normal in size with greater than 50%  respiratory variability, suggesting right atrial pressure of 3 mmHg.   Assessment and Plan:  1.  History of severe calcific aortic stenosis status post TAVR at [facility] in 2019.  Echocardiogram in March revealed Medtronic TAVR with mean gradient 7 mmHg and mild aortic regurgitation.   2.  CAD status post DES to the ostial RCA in October 2019.   3.  CKD stage IIIb.  Creatinine 1.27 in September 2023.  4.  Mixed hyperlipidemia, on Zocor 20 mg daily.  LDL 54 in September 2023.  Disposition:  Follow up {follow up:15908}  Signed, Jonelle Sidle, [name], F.A.C.C. [facility] at [facility]

## 2023-09-06 ENCOUNTER — Other Ambulatory Visit: Payer: Self-pay | Admitting: Cardiology

## 2023-09-08 ENCOUNTER — Ambulatory Visit: Payer: Medicare Other | Admitting: Cardiology

## 2023-09-08 DIAGNOSIS — Z953 Presence of xenogenic heart valve: Secondary | ICD-10-CM

## 2023-09-09 DIAGNOSIS — Z79890 Hormone replacement therapy (postmenopausal): Secondary | ICD-10-CM | POA: Diagnosis not present

## 2023-09-09 DIAGNOSIS — M1990 Unspecified osteoarthritis, unspecified site: Secondary | ICD-10-CM | POA: Diagnosis not present

## 2023-09-09 DIAGNOSIS — I10 Essential (primary) hypertension: Secondary | ICD-10-CM | POA: Diagnosis not present

## 2023-09-09 DIAGNOSIS — E118 Type 2 diabetes mellitus with unspecified complications: Secondary | ICD-10-CM | POA: Diagnosis not present

## 2023-09-09 DIAGNOSIS — Z79899 Other long term (current) drug therapy: Secondary | ICD-10-CM | POA: Diagnosis not present

## 2023-09-09 DIAGNOSIS — Z125 Encounter for screening for malignant neoplasm of prostate: Secondary | ICD-10-CM | POA: Diagnosis not present

## 2023-09-15 DIAGNOSIS — N1832 Chronic kidney disease, stage 3b: Secondary | ICD-10-CM | POA: Diagnosis not present

## 2023-09-15 DIAGNOSIS — E1122 Type 2 diabetes mellitus with diabetic chronic kidney disease: Secondary | ICD-10-CM | POA: Diagnosis not present

## 2023-09-15 DIAGNOSIS — Z23 Encounter for immunization: Secondary | ICD-10-CM | POA: Diagnosis not present

## 2023-09-15 DIAGNOSIS — Z8546 Personal history of malignant neoplasm of prostate: Secondary | ICD-10-CM | POA: Diagnosis not present

## 2023-09-15 DIAGNOSIS — E785 Hyperlipidemia, unspecified: Secondary | ICD-10-CM | POA: Diagnosis not present

## 2023-09-15 DIAGNOSIS — I10 Essential (primary) hypertension: Secondary | ICD-10-CM | POA: Diagnosis not present

## 2023-09-20 DIAGNOSIS — E1142 Type 2 diabetes mellitus with diabetic polyneuropathy: Secondary | ICD-10-CM | POA: Diagnosis not present

## 2023-09-20 DIAGNOSIS — E1151 Type 2 diabetes mellitus with diabetic peripheral angiopathy without gangrene: Secondary | ICD-10-CM | POA: Diagnosis not present

## 2023-09-21 ENCOUNTER — Encounter: Payer: Self-pay | Admitting: Orthopaedic Surgery

## 2023-09-21 ENCOUNTER — Ambulatory Visit (INDEPENDENT_AMBULATORY_CARE_PROVIDER_SITE_OTHER): Payer: Medicare Other | Admitting: Orthopaedic Surgery

## 2023-09-21 DIAGNOSIS — M25562 Pain in left knee: Secondary | ICD-10-CM

## 2023-09-21 DIAGNOSIS — G8929 Other chronic pain: Secondary | ICD-10-CM

## 2023-09-21 DIAGNOSIS — M25462 Effusion, left knee: Secondary | ICD-10-CM

## 2023-09-21 NOTE — Progress Notes (Signed)
PROCEDURE NOTE:  The patient request injection, verbal consent was obtained.  The left knee was prepped appropriately after time out was performed.   Sterile technique was observed and anesthesia was provided by ethyl chloride and a 20-gauge needle was used to inject the knee area.  A 16-gauge needle was then used to aspirate the knee.  Color of fluid aspirated was straw  Total cc's aspirated was 44.    Injection of 1 cc of DepoMedrol 40 mg with several cc's of plain xylocaine was then performed.  A band aid dressing was applied.  The patient was advised to apply ice later today and tomorrow to the injection sight as needed.  Encounter Diagnoses  Name Primary?   Chronic pain of left knee Yes   Effusion, left knee    Return in one month.  Call if any problem.  Precautions discussed.  Electronically Signed [name], MD 10/1/20249:48 AM

## 2023-09-22 DIAGNOSIS — M25462 Effusion, left knee: Secondary | ICD-10-CM | POA: Diagnosis not present

## 2023-09-22 DIAGNOSIS — G8929 Other chronic pain: Secondary | ICD-10-CM | POA: Diagnosis not present

## 2023-09-22 DIAGNOSIS — M25562 Pain in left knee: Secondary | ICD-10-CM | POA: Diagnosis not present

## 2023-09-22 MED ORDER — METHYLPREDNISOLONE ACETATE 40 MG/ML IJ SUSP
40.0000 mg | Freq: Once | INTRAMUSCULAR | Status: AC
  Administered 2023-09-22: 40 mg via INTRA_ARTICULAR

## 2023-09-22 NOTE — Addendum Note (Signed)
Addended by: Michaele Offer on: 09/22/2023 03:07 PM   Modules accepted: Orders

## 2023-10-19 ENCOUNTER — Ambulatory Visit (INDEPENDENT_AMBULATORY_CARE_PROVIDER_SITE_OTHER): Payer: Medicare Other | Admitting: Orthopaedic Surgery

## 2023-10-19 ENCOUNTER — Encounter: Payer: Self-pay | Admitting: Orthopaedic Surgery

## 2023-10-19 DIAGNOSIS — M25562 Pain in left knee: Secondary | ICD-10-CM

## 2023-10-19 DIAGNOSIS — M25462 Effusion, left knee: Secondary | ICD-10-CM | POA: Diagnosis not present

## 2023-10-19 DIAGNOSIS — G8929 Other chronic pain: Secondary | ICD-10-CM

## 2023-10-19 MED ORDER — METHYLPREDNISOLONE ACETATE 40 MG/ML IJ SUSP
40.0000 mg | Freq: Once | INTRAMUSCULAR | Status: AC
  Administered 2023-10-19: 40 mg via INTRA_ARTICULAR

## 2023-10-19 NOTE — Addendum Note (Signed)
Addended by: Michaele Offer on: 10/19/2023 10:01 AM   Modules accepted: Orders

## 2023-10-19 NOTE — Progress Notes (Signed)
PROCEDURE NOTE:  The patient request injection, verbal consent was obtained.  The left knee was prepped appropriately after time out was performed.   Sterile technique was observed and anesthesia was provided by ethyl chloride and a 20-gauge needle was used to inject the knee area.  A 16-gauge needle was then used to aspirate the knee.  Color of fluid aspirated was straw  Total cc's aspirated was 42.    Injection of 1 cc of DepoMedrol 40mg  with several cc's of plain xylocaine was then performed.  A band aid dressing was applied.  The patient was advised to apply ice later today and tomorrow to the injection sight as needed.   Encounter Diagnoses  Name Primary?   Chronic pain of left knee Yes   Effusion, left knee    Return in one month.  Call if any problem.  Precautions discussed.  Electronically Signed [name], MD 10/29/20249:39 AM

## 2023-11-08 DIAGNOSIS — E1142 Type 2 diabetes mellitus with diabetic polyneuropathy: Secondary | ICD-10-CM | POA: Diagnosis not present

## 2023-11-08 DIAGNOSIS — B351 Tinea unguium: Secondary | ICD-10-CM | POA: Diagnosis not present

## 2023-11-08 DIAGNOSIS — E1151 Type 2 diabetes mellitus with diabetic peripheral angiopathy without gangrene: Secondary | ICD-10-CM | POA: Diagnosis not present

## 2023-11-17 ENCOUNTER — Ambulatory Visit (INDEPENDENT_AMBULATORY_CARE_PROVIDER_SITE_OTHER): Payer: Medicare Other | Admitting: Orthopaedic Surgery

## 2023-11-17 ENCOUNTER — Encounter: Payer: Self-pay | Admitting: Orthopaedic Surgery

## 2023-11-17 DIAGNOSIS — M25562 Pain in left knee: Secondary | ICD-10-CM

## 2023-11-17 DIAGNOSIS — M25561 Pain in right knee: Secondary | ICD-10-CM | POA: Diagnosis not present

## 2023-11-17 DIAGNOSIS — G8929 Other chronic pain: Secondary | ICD-10-CM

## 2023-11-17 MED ORDER — METHYLPREDNISOLONE ACETATE 40 MG/ML IJ SUSP
40.0000 mg | Freq: Once | INTRAMUSCULAR | Status: AC
  Administered 2023-11-17: 40 mg via INTRA_ARTICULAR

## 2023-11-17 NOTE — Progress Notes (Signed)
PROCEDURE NOTE:  The patient request injection, verbal consent was obtained.  The right knee was prepped appropriately after time out was performed.   Sterile technique was observed and anesthesia was provided by ethyl chloride and a 20-gauge needle was used to inject the knee area.  A 16-gauge needle was then used to aspirate the knee.  Color of fluid aspirated was straw  Total cc's aspirated was 25.    Injection of 1 cc of DepoMedrol 40 mg with several cc's of plain xylocaine was then performed.  A band aid dressing was applied.  The patient was advised to apply ice later today and tomorrow to the injection sight as needed.   PROCEDURE NOTE:  The patient request injection, verbal consent was obtained.  The left knee was prepped appropriately after time out was performed.   Sterile technique was observed and anesthesia was provided by ethyl chloride and a 20-gauge needle was used to inject the knee area.  A 16-gauge needle was then used to aspirate the knee.  Color of fluid aspirated was straw  Total cc's aspirated was 30.    Injection of 1 cc of DepoMedrol 40 mg with several cc's of plain xylocaine was then performed.  A band aid dressing was applied.  The patient was advised to apply ice later today and tomorrow to the injection sight as needed.   Encounter Diagnosis  Name Primary?   Chronic pain of both knees Yes   Return after New Year.  Call if any problem.  Precautions discussed.  Electronically Signed [name], MD 11/27/20248:37 AM

## 2023-11-17 NOTE — Addendum Note (Signed)
Addended byCaffie Damme on: 11/17/2023 08:55 AM   Modules accepted: Orders

## 2023-12-16 ENCOUNTER — Ambulatory Visit: Payer: Medicare Other | Admitting: Orthopaedic Surgery

## 2023-12-29 ENCOUNTER — Ambulatory Visit: Payer: Medicare Other | Admitting: Orthopaedic Surgery

## 2023-12-29 ENCOUNTER — Encounter: Payer: Self-pay | Admitting: Orthopaedic Surgery

## 2023-12-29 DIAGNOSIS — G8929 Other chronic pain: Secondary | ICD-10-CM | POA: Diagnosis not present

## 2023-12-29 DIAGNOSIS — M25462 Effusion, left knee: Secondary | ICD-10-CM | POA: Diagnosis not present

## 2023-12-29 DIAGNOSIS — M25562 Pain in left knee: Secondary | ICD-10-CM | POA: Diagnosis not present

## 2023-12-29 NOTE — Progress Notes (Signed)
 PROCEDURE NOTE:  The patient request injection, verbal consent was obtained.  The left knee was prepped appropriately after time out was performed.   Sterile technique was observed and anesthesia was provided by ethyl chloride and a 20-gauge needle was used to inject the knee area.  A 16-gauge needle was then used to aspirate the knee.  Color of fluid aspirated was straw  Total cc's aspirated was 25.    Injection of 1 cc of DepoMedrol 40 with several cc's of plain xylocaine was then performed.  A band aid dressing was applied.  The patient was advised to apply ice later today and tomorrow to the injection sight as needed.   Encounter Diagnoses  Name Primary?   Chronic pain of left knee Yes   Effusion, left knee    Return prn.  Call if any problem.  Precautions discussed.  Electronically Signed Lemond Stable, MD 1/8/20258:41 AM

## 2024-01-10 DIAGNOSIS — E1129 Type 2 diabetes mellitus with other diabetic kidney complication: Secondary | ICD-10-CM | POA: Diagnosis not present

## 2024-01-10 DIAGNOSIS — C61 Malignant neoplasm of prostate: Secondary | ICD-10-CM | POA: Diagnosis not present

## 2024-01-10 DIAGNOSIS — I10 Essential (primary) hypertension: Secondary | ICD-10-CM | POA: Diagnosis not present

## 2024-01-10 DIAGNOSIS — E785 Hyperlipidemia, unspecified: Secondary | ICD-10-CM | POA: Diagnosis not present

## 2024-01-10 DIAGNOSIS — N1831 Chronic kidney disease, stage 3a: Secondary | ICD-10-CM | POA: Diagnosis not present

## 2024-01-10 DIAGNOSIS — Z79899 Other long term (current) drug therapy: Secondary | ICD-10-CM | POA: Diagnosis not present

## 2024-01-17 DIAGNOSIS — N1831 Chronic kidney disease, stage 3a: Secondary | ICD-10-CM | POA: Diagnosis not present

## 2024-01-17 DIAGNOSIS — C4491 Basal cell carcinoma of skin, unspecified: Secondary | ICD-10-CM | POA: Diagnosis not present

## 2024-01-17 DIAGNOSIS — E1122 Type 2 diabetes mellitus with diabetic chronic kidney disease: Secondary | ICD-10-CM | POA: Diagnosis not present

## 2024-01-17 DIAGNOSIS — E785 Hyperlipidemia, unspecified: Secondary | ICD-10-CM | POA: Diagnosis not present

## 2024-01-26 ENCOUNTER — Encounter: Payer: Self-pay | Admitting: Orthopaedic Surgery

## 2024-01-26 ENCOUNTER — Ambulatory Visit (INDEPENDENT_AMBULATORY_CARE_PROVIDER_SITE_OTHER): Payer: Medicare Other | Admitting: Orthopaedic Surgery

## 2024-01-26 DIAGNOSIS — Z961 Presence of intraocular lens: Secondary | ICD-10-CM | POA: Diagnosis not present

## 2024-01-26 DIAGNOSIS — H524 Presbyopia: Secondary | ICD-10-CM | POA: Diagnosis not present

## 2024-01-26 DIAGNOSIS — G8929 Other chronic pain: Secondary | ICD-10-CM | POA: Diagnosis not present

## 2024-01-26 DIAGNOSIS — M25562 Pain in left knee: Secondary | ICD-10-CM | POA: Diagnosis not present

## 2024-01-26 MED ORDER — METHYLPREDNISOLONE ACETATE 40 MG/ML IJ SUSP
40.0000 mg | Freq: Once | INTRAMUSCULAR | Status: AC
  Administered 2024-01-26: 40 mg via INTRA_ARTICULAR

## 2024-01-26 NOTE — Addendum Note (Signed)
 Addended by: Georgann Kim on: 01/26/2024 09:50 AM   Modules accepted: Orders

## 2024-01-26 NOTE — Progress Notes (Signed)
 PROCEDURE NOTE:  The patient request injection, verbal consent was obtained.  The left knee was prepped appropriately after time out was performed.   Sterile technique was observed and anesthesia was provided by ethyl chloride and a 20-gauge needle was used to inject the knee area.  A 16-gauge needle was then used to aspirate the knee.  Color of fluid aspirated was blood tinged  Total cc's aspirated was 36.    Injection of 1 cc of DepoMedrol 40 with several cc's of plain xylocaine was then performed.  A band aid dressing was applied.  The patient was advised to apply ice later today and tomorrow to the injection sight as needed.   Encounter Diagnosis  Name Primary?   Chronic pain of left knee Yes   Return in one month.  Call if any problem.  Precautions discussed.  Electronically Signed Lemond Stable, MD 2/5/20258:32 AM

## 2024-02-01 DIAGNOSIS — L820 Inflamed seborrheic keratosis: Secondary | ICD-10-CM | POA: Diagnosis not present

## 2024-02-01 DIAGNOSIS — L821 Other seborrheic keratosis: Secondary | ICD-10-CM | POA: Diagnosis not present

## 2024-02-01 DIAGNOSIS — C44311 Basal cell carcinoma of skin of nose: Secondary | ICD-10-CM | POA: Diagnosis not present

## 2024-02-01 DIAGNOSIS — D225 Melanocytic nevi of trunk: Secondary | ICD-10-CM | POA: Diagnosis not present

## 2024-02-01 DIAGNOSIS — C44319 Basal cell carcinoma of skin of other parts of face: Secondary | ICD-10-CM | POA: Diagnosis not present

## 2024-02-23 ENCOUNTER — Ambulatory Visit (INDEPENDENT_AMBULATORY_CARE_PROVIDER_SITE_OTHER): Payer: Medicare Other | Admitting: Orthopaedic Surgery

## 2024-02-23 ENCOUNTER — Encounter: Payer: Self-pay | Admitting: Orthopaedic Surgery

## 2024-02-23 DIAGNOSIS — M25462 Effusion, left knee: Secondary | ICD-10-CM | POA: Diagnosis not present

## 2024-02-23 DIAGNOSIS — M25562 Pain in left knee: Secondary | ICD-10-CM

## 2024-02-23 DIAGNOSIS — G8929 Other chronic pain: Secondary | ICD-10-CM | POA: Diagnosis not present

## 2024-02-23 MED ORDER — METHYLPREDNISOLONE ACETATE 40 MG/ML IJ SUSP
40.0000 mg | Freq: Once | INTRAMUSCULAR | Status: AC
  Administered 2024-02-23: 40 mg via INTRA_ARTICULAR

## 2024-02-23 NOTE — Progress Notes (Addendum)
 My knee is swollen again.  PROCEDURE NOTE:  The patient request injection, verbal consent was obtained.  The left knee was prepped appropriately after time out was performed.   Sterile technique was observed and anesthesia was provided by ethyl chloride and a 20-gauge needle was used to inject the knee area.  A 16-gauge needle was then used to aspirate the knee.  Color of fluid aspirated was straw  Total cc's aspirated was 30.    Injection of 1 cc of DepoMedrol 40 with several cc's of plain xylocaine was then performed.  A band aid dressing was applied.  The patient was advised to apply ice later today and tomorrow to the injection sight as needed.   Encounter Diagnoses  Name Primary?   Chronic pain of left knee Yes   Effusion, left knee    He has a cut on the right dorsum of the hand and this was dressed and Steristrips used. Return in five weeks.  Call if any problem.  Precautions discussed.  Electronically Signed [name], MD 3/5/20258:40 AM

## 2024-03-06 DIAGNOSIS — B351 Tinea unguium: Secondary | ICD-10-CM | POA: Diagnosis not present

## 2024-03-06 DIAGNOSIS — E1142 Type 2 diabetes mellitus with diabetic polyneuropathy: Secondary | ICD-10-CM | POA: Diagnosis not present

## 2024-03-06 DIAGNOSIS — E1151 Type 2 diabetes mellitus with diabetic peripheral angiopathy without gangrene: Secondary | ICD-10-CM | POA: Diagnosis not present

## 2024-03-14 DIAGNOSIS — X32XXXD Exposure to sunlight, subsequent encounter: Secondary | ICD-10-CM | POA: Diagnosis not present

## 2024-03-14 DIAGNOSIS — Z08 Encounter for follow-up examination after completed treatment for malignant neoplasm: Secondary | ICD-10-CM | POA: Diagnosis not present

## 2024-03-14 DIAGNOSIS — L821 Other seborrheic keratosis: Secondary | ICD-10-CM | POA: Diagnosis not present

## 2024-03-14 DIAGNOSIS — Z85828 Personal history of other malignant neoplasm of skin: Secondary | ICD-10-CM | POA: Diagnosis not present

## 2024-03-14 DIAGNOSIS — L570 Actinic keratosis: Secondary | ICD-10-CM | POA: Diagnosis not present

## 2024-03-29 ENCOUNTER — Encounter: Payer: Self-pay | Admitting: Orthopaedic Surgery

## 2024-03-29 ENCOUNTER — Ambulatory Visit: Admitting: Orthopaedic Surgery

## 2024-03-29 ENCOUNTER — Ambulatory Visit (INDEPENDENT_AMBULATORY_CARE_PROVIDER_SITE_OTHER): Admitting: Orthopaedic Surgery

## 2024-03-29 DIAGNOSIS — M25462 Effusion, left knee: Secondary | ICD-10-CM

## 2024-03-29 MED ORDER — METHYLPREDNISOLONE ACETATE 40 MG/ML IJ SUSP
40.0000 mg | Freq: Once | INTRAMUSCULAR | Status: AC
  Administered 2024-03-29: 40 mg via INTRA_ARTICULAR

## 2024-03-29 NOTE — Progress Notes (Signed)
 PROCEDURE NOTE:  The patient request injection, verbal consent was obtained.  The left knee was prepped appropriately after time out was performed.   Sterile technique was observed and anesthesia was provided by ethyl chloride and a 20-gauge needle was used to inject the knee area.  A 16-gauge needle was then used to aspirate the knee.  Color of fluid aspirated was blood tinged  Total cc's aspirated was 35.    Injection of 1 cc of DepoMedrol 40 with several cc's of plain xylocaine was then performed.  A band aid dressing was applied.  The patient was advised to apply ice later today and tomorrow to the injection sight as needed.   Encounter Diagnosis  Name Primary?   Effusion, left knee Yes   Return prn.  Call if any problem.  Precautions discussed.  Electronically Signed [name], MD 4/9/20259:02 AM

## 2024-03-29 NOTE — Addendum Note (Signed)
 Addended by: Elvina Mattes T on: 03/29/2024 02:46 PM   Modules accepted: Orders

## 2024-04-05 ENCOUNTER — Ambulatory Visit: Admitting: Orthopaedic Surgery

## 2024-04-10 DIAGNOSIS — Z23 Encounter for immunization: Secondary | ICD-10-CM | POA: Diagnosis not present

## 2024-04-10 DIAGNOSIS — S61201A Unspecified open wound of left index finger without damage to nail, initial encounter: Secondary | ICD-10-CM | POA: Diagnosis not present

## 2024-04-10 DIAGNOSIS — S61253A Open bite of left middle finger without damage to nail, initial encounter: Secondary | ICD-10-CM | POA: Diagnosis not present

## 2024-04-14 DIAGNOSIS — S61201D Unspecified open wound of left index finger without damage to nail, subsequent encounter: Secondary | ICD-10-CM | POA: Diagnosis not present

## 2024-04-14 DIAGNOSIS — Z4800 Encounter for change or removal of nonsurgical wound dressing: Secondary | ICD-10-CM | POA: Diagnosis not present

## 2024-04-19 DIAGNOSIS — Z4802 Encounter for removal of sutures: Secondary | ICD-10-CM | POA: Diagnosis not present

## 2024-05-10 ENCOUNTER — Encounter: Payer: Self-pay | Admitting: Orthopaedic Surgery

## 2024-05-10 ENCOUNTER — Ambulatory Visit (INDEPENDENT_AMBULATORY_CARE_PROVIDER_SITE_OTHER): Admitting: Orthopaedic Surgery

## 2024-05-10 DIAGNOSIS — Z79899 Other long term (current) drug therapy: Secondary | ICD-10-CM | POA: Diagnosis not present

## 2024-05-10 DIAGNOSIS — N1831 Chronic kidney disease, stage 3a: Secondary | ICD-10-CM | POA: Diagnosis not present

## 2024-05-10 DIAGNOSIS — E1129 Type 2 diabetes mellitus with other diabetic kidney complication: Secondary | ICD-10-CM | POA: Diagnosis not present

## 2024-05-10 DIAGNOSIS — M25462 Effusion, left knee: Secondary | ICD-10-CM

## 2024-05-10 DIAGNOSIS — E785 Hyperlipidemia, unspecified: Secondary | ICD-10-CM | POA: Diagnosis not present

## 2024-05-10 MED ORDER — METHYLPREDNISOLONE ACETATE 40 MG/ML IJ SUSP
40.0000 mg | Freq: Once | INTRAMUSCULAR | Status: AC
  Administered 2024-05-10: 40 mg via INTRA_ARTICULAR

## 2024-05-10 NOTE — Addendum Note (Signed)
 Addended by: [place] Snow T on: 05/10/2024 10:22 AM   Modules accepted: Orders

## 2024-05-10 NOTE — Progress Notes (Signed)
 PROCEDURE NOTE:  The patient request injection, verbal consent was obtained.  The left knee was prepped appropriately after time out was performed.   Sterile technique was observed and anesthesia was provided by ethyl chloride and a 20-gauge needle was used to inject the knee area.  A 16-gauge needle was then used to aspirate the knee.  Color of fluid aspirated was blood tinged  Total cc's aspirated was 30.    Injection of 1 cc of DepoMedrol 40 with several cc's of plain xylocaine was then performed.  A band aid dressing was applied.  The patient was advised to apply ice later today and tomorrow to the injection sight as needed.   Encounter Diagnosis  Name Primary?   Effusion, left knee Yes   Return prn.  Call if any problem.  Precautions discussed.  Electronically Signed Pleasant Brilliant, MD 5/21/20259:49 AM

## 2024-05-17 DIAGNOSIS — N1831 Chronic kidney disease, stage 3a: Secondary | ICD-10-CM | POA: Diagnosis not present

## 2024-05-17 DIAGNOSIS — I10 Essential (primary) hypertension: Secondary | ICD-10-CM | POA: Diagnosis not present

## 2024-05-17 DIAGNOSIS — E1122 Type 2 diabetes mellitus with diabetic chronic kidney disease: Secondary | ICD-10-CM | POA: Diagnosis not present

## 2024-06-21 ENCOUNTER — Ambulatory Visit (INDEPENDENT_AMBULATORY_CARE_PROVIDER_SITE_OTHER): Admitting: Orthopaedic Surgery

## 2024-06-21 ENCOUNTER — Encounter: Payer: Self-pay | Admitting: Orthopaedic Surgery

## 2024-06-21 DIAGNOSIS — G8929 Other chronic pain: Secondary | ICD-10-CM | POA: Diagnosis not present

## 2024-06-21 DIAGNOSIS — M25462 Effusion, left knee: Secondary | ICD-10-CM

## 2024-06-21 DIAGNOSIS — M25562 Pain in left knee: Secondary | ICD-10-CM | POA: Diagnosis not present

## 2024-06-21 MED ORDER — METHYLPREDNISOLONE ACETATE 40 MG/ML IJ SUSP
40.0000 mg | Freq: Once | INTRAMUSCULAR | Status: AC
  Administered 2024-06-21: 40 mg via INTRA_ARTICULAR

## 2024-06-21 MED ORDER — METHYLPREDNISOLONE ACETATE 40 MG/ML IJ SUSP: 40.0000 mg | Freq: Once | INTRAMUSCULAR | Status: AC

## 2024-06-21 NOTE — Progress Notes (Signed)
 PROCEDURE NOTE:  The patient request injection, verbal consent was obtained.  The left knee was prepped appropriately after time out was performed.   Sterile technique was observed and anesthesia was provided by ethyl chloride and a 20-gauge needle was used to inject the knee area.  A 16-gauge needle was then used to aspirate the knee.  Color of fluid aspirated was blood tinged  Total cc's aspirated was 16.    Injection of 1 cc of DepoMedrol 40 with several cc's of plain xylocaine was then performed.  A band aid dressing was applied.  The patient was advised to apply ice later today and tomorrow to the injection sight as needed.   Encounter Diagnoses  Name Primary?   Effusion, left knee Yes   Chronic pain of left knee    Return in six weeks.  Call if any problem.  Precautions discussed. Electronically Signed Lemond Stable, MD 7/2/20258:09 AM

## 2024-07-03 DIAGNOSIS — B351 Tinea unguium: Secondary | ICD-10-CM | POA: Diagnosis not present

## 2024-07-03 DIAGNOSIS — E1142 Type 2 diabetes mellitus with diabetic polyneuropathy: Secondary | ICD-10-CM | POA: Diagnosis not present

## 2024-07-03 DIAGNOSIS — E1151 Type 2 diabetes mellitus with diabetic peripheral angiopathy without gangrene: Secondary | ICD-10-CM | POA: Diagnosis not present

## 2024-07-07 ENCOUNTER — Encounter: Payer: Self-pay | Admitting: Advanced Practice Midwife

## 2024-07-31 DIAGNOSIS — H401131 Primary open-angle glaucoma, bilateral, mild stage: Secondary | ICD-10-CM | POA: Diagnosis not present

## 2024-08-02 ENCOUNTER — Encounter: Payer: Self-pay | Admitting: Orthopaedic Surgery

## 2024-08-02 ENCOUNTER — Ambulatory Visit (INDEPENDENT_AMBULATORY_CARE_PROVIDER_SITE_OTHER): Admitting: Orthopaedic Surgery

## 2024-08-02 DIAGNOSIS — G8929 Other chronic pain: Secondary | ICD-10-CM

## 2024-08-02 DIAGNOSIS — M25462 Effusion, left knee: Secondary | ICD-10-CM

## 2024-08-02 DIAGNOSIS — M25562 Pain in left knee: Secondary | ICD-10-CM

## 2024-08-02 MED ORDER — METHYLPREDNISOLONE ACETATE 40 MG/ML IJ SUSP
40.0000 mg | Freq: Once | INTRAMUSCULAR | Status: AC
  Administered 2024-08-02 (×2): 40 mg via INTRA_ARTICULAR

## 2024-08-02 NOTE — Progress Notes (Signed)
 PROCEDURE NOTE:  The patient request injection, verbal consent was obtained.  The left knee was prepped appropriately after time out was performed.   Sterile technique was observed and anesthesia was provided by ethyl chloride and a 20-gauge needle was used to inject the knee area.  A 16-gauge needle was then used to aspirate the knee.  Color of fluid aspirated was straw  Total cc's aspirated was 26.    Injection of 1 cc of DepoMedrol 40 with several cc's of plain xylocaine was then performed.  A band aid dressing was applied.  The patient was advised to apply ice later today and tomorrow to the injection sight as needed.   Encounter Diagnoses  Name Primary?   Effusion, left knee Yes   Chronic pain of left knee    Return in two months.  Call if any problem.  Precautions discussed.  Electronically Signed Lemond Stable, MD 8/13/202510:27 AM

## 2024-08-02 NOTE — Addendum Note (Signed)
 Addended by: MARCINE HUSBAND T on: 08/02/2024 11:27 AM   Modules accepted: Orders

## 2024-08-11 ENCOUNTER — Encounter: Payer: Self-pay | Admitting: Radiology

## 2024-09-11 DIAGNOSIS — N1831 Chronic kidney disease, stage 3a: Secondary | ICD-10-CM | POA: Diagnosis not present

## 2024-09-11 DIAGNOSIS — Z125 Encounter for screening for malignant neoplasm of prostate: Secondary | ICD-10-CM | POA: Diagnosis not present

## 2024-09-11 DIAGNOSIS — E1129 Type 2 diabetes mellitus with other diabetic kidney complication: Secondary | ICD-10-CM | POA: Diagnosis not present

## 2024-09-11 DIAGNOSIS — E785 Hyperlipidemia, unspecified: Secondary | ICD-10-CM | POA: Diagnosis not present

## 2024-09-18 DIAGNOSIS — E785 Hyperlipidemia, unspecified: Secondary | ICD-10-CM | POA: Diagnosis not present

## 2024-09-18 DIAGNOSIS — Z8546 Personal history of malignant neoplasm of prostate: Secondary | ICD-10-CM | POA: Diagnosis not present

## 2024-09-18 DIAGNOSIS — N1831 Chronic kidney disease, stage 3a: Secondary | ICD-10-CM | POA: Diagnosis not present

## 2024-09-18 DIAGNOSIS — R800 Isolated proteinuria: Secondary | ICD-10-CM | POA: Diagnosis not present

## 2024-09-18 DIAGNOSIS — E1122 Type 2 diabetes mellitus with diabetic chronic kidney disease: Secondary | ICD-10-CM | POA: Diagnosis not present

## 2024-09-18 DIAGNOSIS — I10 Essential (primary) hypertension: Secondary | ICD-10-CM | POA: Diagnosis not present

## 2024-09-18 DIAGNOSIS — Z23 Encounter for immunization: Secondary | ICD-10-CM | POA: Diagnosis not present

## 2024-09-20 ENCOUNTER — Encounter: Payer: Self-pay | Admitting: Orthopaedic Surgery

## 2024-09-20 ENCOUNTER — Ambulatory Visit: Admitting: Orthopaedic Surgery

## 2024-09-20 ENCOUNTER — Encounter: Payer: Self-pay | Admitting: Cardiology

## 2024-09-20 ENCOUNTER — Ambulatory Visit: Attending: Cardiology | Admitting: Cardiology

## 2024-09-20 VITALS — BP 120/56 | HR 81 | Ht 68.5 in | Wt 192.2 lb

## 2024-09-20 DIAGNOSIS — I10 Essential (primary) hypertension: Secondary | ICD-10-CM | POA: Diagnosis not present

## 2024-09-20 DIAGNOSIS — N1831 Chronic kidney disease, stage 3a: Secondary | ICD-10-CM | POA: Diagnosis not present

## 2024-09-20 DIAGNOSIS — Z952 Presence of prosthetic heart valve: Secondary | ICD-10-CM | POA: Diagnosis not present

## 2024-09-20 DIAGNOSIS — M25562 Pain in left knee: Secondary | ICD-10-CM

## 2024-09-20 DIAGNOSIS — I2510 Atherosclerotic heart disease of native coronary artery without angina pectoris: Secondary | ICD-10-CM | POA: Insufficient documentation

## 2024-09-20 DIAGNOSIS — E782 Mixed hyperlipidemia: Secondary | ICD-10-CM | POA: Diagnosis not present

## 2024-09-20 DIAGNOSIS — I25119 Atherosclerotic heart disease of native coronary artery with unspecified angina pectoris: Secondary | ICD-10-CM | POA: Insufficient documentation

## 2024-09-20 DIAGNOSIS — M25462 Effusion, left knee: Secondary | ICD-10-CM

## 2024-09-20 DIAGNOSIS — I2583 Coronary atherosclerosis due to lipid rich plaque: Secondary | ICD-10-CM | POA: Diagnosis not present

## 2024-09-20 DIAGNOSIS — G8929 Other chronic pain: Secondary | ICD-10-CM | POA: Diagnosis not present

## 2024-09-20 NOTE — Progress Notes (Signed)
 PROCEDURE NOTE:  The patient request injection, verbal consent was obtained.  The left knee was prepped appropriately after time out was performed.   Sterile technique was observed and anesthesia was provided by ethyl chloride and a 20-gauge needle was used to inject the knee area.  A 16-gauge needle was then used to aspirate the knee.  Color of fluid aspirated was blood tinged  Total cc's aspirated was 14.    Injection of 1 cc of DepoMedrol 40 with several cc's of plain xylocaine was then performed.  A band aid dressing was applied.  The patient was advised to apply ice later today and tomorrow to the injection sight as needed.  Encounter Diagnoses  Name Primary?   Chronic pain of left knee Yes   Effusion, left knee    Return prn.  I have informed the patient I will be retiring from medical practice and from this office on September 21, 2024.  The patient has been offered continuing care with Dr. Margrette or Dr. Onesimo of this office.  The patient may choose another provider and the records will be forwarded after proper signature and notification.  Patient understands and agrees.  Call if any problem.  Precautions discussed.  Electronically Signed Lemond Stable, MD 10/1/20252:01 PM

## 2024-09-20 NOTE — Progress Notes (Signed)
 Cardiology Office Note  Date: 09/20/2024   ID: [name], DOB [birth year], MRN [id number]  History of Present Illness: [name] is a 89 y.o. male last seen in March 2024.  He is here for a follow-up visit.  Still lives in his own home and functional with basic ADLs.  [place]  He has slowed down over the years, reports NYHA class II dyspnea, trouble with his gait and uses a cane.  No palpitations or syncope.  No angina.  I reviewed his medications.  No changes from a cardiac perspective.  He had lab work with Dr. Sheryle recently with LDL 55 on Crestor 10 mg daily.  I reviewed his ECG today which shows sinus rhythm PAC, prolonged PR interval, IVCD with QRS duration 116 ms and repolarization abnormalities.  He is due for a follow-up echocardiogram.  Physical Exam: VS:  BP (!) 120/56 (BP Location: Right Arm, Cuff Size: Normal)   Pulse 81   Ht 5' 8.5 (1.74 m)   Wt 192 lb 3.2 oz (87.2 kg)   SpO2 97%   BMI 28.80 kg/m , BMI Body mass index is 28.8 kg/m.  Wt Readings from Last 3 Encounters:  09/20/24 192 lb 3.2 oz (87.2 kg)  07/21/23 194 lb (88 kg)  03/01/23 194 lb (88 kg)    General: Patient appears comfortable at rest. HEENT: Conjunctiva and lids normal. Neck: Supple, no elevated JVP or carotid bruits. Lungs: Clear to auscultation, nonlabored breathing at rest. Cardiac: Regular rate and rhythm, no S3, 2/6 systolic murmur. Extremities: No pitting edema.  ECG:  An ECG dated 08/21/2022 was personally reviewed today and demonstrated:  Sinus rhythm with prolonged PR interval, IVCD with decreased R wave progression, PACs.  Labwork:  September 2025: Hemoglobin 12.6, platelets 321, BUN 22, creatinine 1.34, potassium 4.4, AST 20, ALT 18, cholesterol 141, triglycerides 97, HDL 68, LDL 55, hemoglobin A1c 6.9%  Other Studies Reviewed Today:  Echocardiogram 03/01/2023:  1. Left ventricular ejection fraction, by  estimation, is 65 to 70%. The  left ventricle has normal function. The left ventricle has no regional  wall motion abnormalities. There is moderate asymmetric left ventricular  hypertrophy of the basal segment.  Left ventricular diastolic parameters are consistent with Grade II  diastolic dysfunction (pseudonormalization).   2. Right ventricular systolic function is normal. The right ventricular  size is normal. There is mildly elevated pulmonary artery systolic  pressure. The estimated right ventricular systolic pressure is 40.2 mmHg.   3. Left atrial size was mildly dilated.   4. Right atrial size was mildly dilated.   5. The mitral valve is degenerative. Mild mitral valve regurgitation.   6. Tricuspid valve regurgitation is mild to moderate.   7. The aortic valve has been repaired/replaced. Aortic valve  regurgitation is mild. There is a 34 mm stented (TAVR) valve present in  the aortic position. Aortic valve mean gradient measures 7.0 mmHg.   8. The inferior vena cava is normal in size with greater than 50%  respiratory variability, suggesting right atrial pressure of 3 mmHg.   Assessment and Plan:  1.  History of severe calcific aortic stenosis status post TAVR at [facility] in 2019.  Echocardiogram in March 2024 showed stable 34 mm Medtronic TAVR function with mean AV gradient 7 mmHg and mild aortic regurgitation.  Reports NYHA class II dyspnea with low-level activity.  No palpitations or syncope.  Plan follow-up echocardiogram for  surveillance.  Continue aspirin 81 mg daily.   2.  CAD status post DES to the ostial RCA in October 2019.  No active angina at current level of activity.  Continue aspirin 81 mg daily and Crestor 10 mg daily.   3.  CKD stage IIIa, creatinine 1.34 with GFR 50.  4.  Mixed hyperlipidemia, LDL 55 and HDL 68 in September.  Continue Crestor 10 mg daily.  5.  Primary hypertension, blood pressure well-controlled today.  Continue Norvasc 5 mg daily, HCTZ 25 mg daily,  Toprol  XL 12.5 mg daily and lisinopril  20 mg daily.  Disposition:  Follow up 1 year.  Signed, Jayson JUDITHANN Sierras, [name], F.A.C.C. [facility] at [facility]

## 2024-09-20 NOTE — Patient Instructions (Signed)
 Medication Instructions:   Your physician recommends that you continue on your current medications as directed. Please refer to the Current Medication list given to you today.   Labwork: None today  Testing/Procedures: Your physician has requested that you have an echocardiogram. Echocardiography is a painless test that uses sound waves to create images of your heart. It provides your doctor with information about the size and shape of your heart and how well your heart's chambers and valves are working. This procedure takes approximately one hour. There are no restrictions for this procedure. Please do NOT wear cologne, perfume, aftershave, or lotions (deodorant is allowed). Please arrive 15 minutes prior to your appointment time.  Please note: We ask at that you not bring children with you during ultrasound (echo/ vascular) testing. Due to room size and safety concerns, children are not allowed in the ultrasound rooms during exams. Our front office staff cannot provide observation of children in our lobby area while testing is being conducted. An adult accompanying a patient to their appointment will only be allowed in the ultrasound room at the discretion of the ultrasound technician under special circumstances. We apologize for any inconvenience.   Follow-Up: 1 year  Any Other Special Instructions Will Be Listed Below (If Applicable).  If you need a refill on your cardiac medications before your next appointment, please call your pharmacy.

## 2024-10-02 ENCOUNTER — Other Ambulatory Visit: Payer: Self-pay | Admitting: Cardiology

## 2024-10-04 ENCOUNTER — Ambulatory Visit: Admitting: Orthopaedic Surgery

## 2024-10-09 DIAGNOSIS — E1142 Type 2 diabetes mellitus with diabetic polyneuropathy: Secondary | ICD-10-CM | POA: Diagnosis not present

## 2024-10-09 DIAGNOSIS — B351 Tinea unguium: Secondary | ICD-10-CM | POA: Diagnosis not present

## 2024-10-11 ENCOUNTER — Ambulatory Visit: Payer: Self-pay | Admitting: Cardiology

## 2024-10-11 ENCOUNTER — Ambulatory Visit (HOSPITAL_COMMUNITY)
Admission: RE | Admit: 2024-10-11 | Discharge: 2024-10-11 | Disposition: A | Discharge status: Home / Self Care | Source: Ambulatory Visit | Attending: Cardiology | Admitting: Cardiology

## 2024-10-11 DIAGNOSIS — Z952 Presence of prosthetic heart valve: Secondary | ICD-10-CM | POA: Insufficient documentation

## 2024-10-11 LAB — ECHOCARDIOGRAM COMPLETE
AR max vel: 1.46 cm2
AV Area VTI: 1.74 cm2
AV Area mean vel: 1.65 cm2
AV Mean grad: 7 mmHg
AV Peak grad: 13.1 mmHg
Ao pk vel: 1.81 m/s
Area-P 1/2: 2.91 cm2
S' Lateral: 2.3 cm

## 2024-10-11 NOTE — Progress Notes (Signed)
*  PRELIMINARY RESULTS* Echocardiogram 2D Echocardiogram has been performed.  [name] 10/11/2024, 11:30 AM

## 2024-10-12 NOTE — Telephone Encounter (Signed)
 Patient returned RN's call regarding results.

## 2024-10-23 ENCOUNTER — Encounter: Payer: Self-pay | Admitting: Radiology

## 2024-10-31 ENCOUNTER — Ambulatory Visit (HOSPITAL_COMMUNITY): Attending: Internal Medicine

## 2024-10-31 ENCOUNTER — Encounter (HOSPITAL_COMMUNITY): Payer: Self-pay

## 2024-10-31 DIAGNOSIS — M6281 Muscle weakness (generalized): Secondary | ICD-10-CM | POA: Diagnosis not present

## 2024-10-31 DIAGNOSIS — Z9181 History of falling: Secondary | ICD-10-CM | POA: Diagnosis not present

## 2024-10-31 NOTE — Therapy (Deleted)
 OUTPATIENT PHYSICAL THERAPY NEURO EVALUATION   Patient Name: [name] MRN: [id number] DOB:08-20-1929, 89 y.o., male Today's Date: 10/31/2024   PCP: Sheryle Carwin, MD  REFERRING PROVIDER: Sheryle Carwin, MD   END OF SESSION:   Past Medical History:  Diagnosis Date   Aortic stenosis    Status post TAVR at [facility], November 2019   CAD (coronary artery disease)    DES to ostial RCA October 2019 at [facility]   Essential hypertension    Glaucoma    Guillain-Barre syndrome 06/2013   Treated at [facility]   History of nephrolithiasis    History of pneumonia    Hyperlipidemia    Patent ductus arteriosus    Surgically repaired at age 69   Prostate cancer (HCC)    Type 2 diabetes mellitus (HCC)    Past Surgical History:  Procedure Laterality Date   CARPAL TUNNEL RELEASE  2012   left   CARPAL TUNNEL RELEASE Right 06/12/2014   Procedure: RIGHT CARPAL TUNNEL RELEASE ;  Surgeon: Arley JONELLE Curia, MD;  Location: [facility];  Service: Orthopedics;  Laterality: Right;   CATARACT EXTRACTION W/PHACO Left 10/13/2016   Procedure: CATARACT EXTRACTION PHACO AND INTRAOCULAR LENS PLACEMENT LEFT EYE CDE=15.04;  Surgeon: Oneil Platts, MD;  Location: [facility];  Service: Ophthalmology;  Laterality: Left;  left   CATARACT EXTRACTION W/PHACO Right 11/03/2016   Procedure: CATARACT EXTRACTION PHACO AND INTRAOCULAR LENS PLACEMENT (IOC);  Surgeon: Oneil Platts, MD;  Location: [facility];  Service: Ophthalmology;  Laterality: Right;  CDE: 15.10   COLONOSCOPY     KNEE ARTHROSCOPY  1962   left   Patent ductus arteriosus repair     1951 - age 48   ULNAR NERVE TRANSPOSITION Right 06/12/2014   Procedure: DECOMPRESSION ULNAR NERVE RIGHT ELBOW;  Surgeon: Arley JONELLE Curia, MD;  Location: [facility];  Service: Orthopedics;  Laterality: Right;   VIDEO ASSISTED THORACOSCOPY (VATS)/THOROCOTOMY  8/14   left-guillian-barre   Patient Active Problem List   Diagnosis Date Noted   Coronary artery disease due to  lipid rich plaque 12/22/2022   S/P TAVR (transcatheter aortic valve replacement) 11/02/2018   PAD (peripheral artery disease) 11/01/2018   CHF (congestive heart failure), NYHA class II, chronic, diastolic (HCC) 10/31/2018   Diabetes mellitus type 2, uncomplicated (HCC) 04/06/2017   Primary open angle glaucoma of both eyes, moderate stage 04/06/2017   Pseudophakia 04/06/2017   Lower extremity weakness 10/23/2013   Difficulty walking 10/23/2013   Guillain-Barre 07/13/2013   DVT of lower extremity (deep venous thrombosis) (HCC) 07/07/2013   Dyspnea 07/04/2013   Hyponatremia 07/04/2013   Diabetes (HCC) 07/03/2013   Hypertension 07/03/2013   Hyperlipidemia 07/03/2013   Lung mass 07/03/2013   Rash 07/03/2013    ONSET DATE: ***  REFERRING DIAG: gait disturbance   THERAPY DIAG:  No diagnosis found.  Rationale for Evaluation and Treatment: Rehabilitation  SUBJECTIVE:  SUBJECTIVE STATEMENT: *** Pt accompanied by: {accompnied:27141}  PERTINENT HISTORY: ***  PAIN:  Are you having pain? {OPRCPAIN:27236}  PRECAUTIONS: {Therapy precautions:24002}  RED FLAGS: {PT Red Flags:29287}   WEIGHT BEARING RESTRICTIONS: {Yes ***/No:24003}  FALLS: Has patient fallen in last 6 months? {fallsyesno:27318}  LIVING ENVIRONMENT: Lives with: {OPRC lives with:25569::lives with their family} Lives in: {Lives in:25570} Stairs: {opstairs:27293} Has following equipment at home: {Assistive devices:23999}  PLOF: {PLOF:24004}  PATIENT GOALS: ***  OBJECTIVE:  Note: Objective measures were completed at Evaluation unless otherwise noted.  DIAGNOSTIC FINDINGS: ***  COGNITION: Overall cognitive status: {cognition:24006}   SENSATION: {sensation:27233}  COORDINATION: ***  EDEMA:  {edema:24020}  MUSCLE  TONE: {LE tone:25568}  MUSCLE LENGTH: Hamstrings: Right *** deg; Left *** deg Debby test: Right *** deg; Left *** deg  DTRs:  {DTR SITE:24025}  POSTURE: {posture:25561}  LOWER EXTREMITY ROM:     {AROM/PROM:27142}  Right Eval Left Eval  Hip flexion    Hip extension    Hip abduction    Hip adduction    Hip internal rotation    Hip external rotation    Knee flexion    Knee extension    Ankle dorsiflexion    Ankle plantarflexion    Ankle inversion    Ankle eversion     (Blank rows = not tested)  LOWER EXTREMITY MMT:    MMT Right Eval Left Eval  Hip flexion    Hip extension    Hip abduction    Hip adduction    Hip internal rotation    Hip external rotation    Knee flexion    Knee extension    Ankle dorsiflexion    Ankle plantarflexion    Ankle inversion    Ankle eversion    (Blank rows = not tested)  BED MOBILITY:  {bed mobility:32615:p}  TRANSFERS: {transfers eval:32620}  RAMP:  {ramp eval:32616}  CURB:  {curb eval:32617}  STAIRS: {stairs eval:32618} GAIT: Findings: {GaitneuroPT:32644::Distance walked: ***,Comments: ***}  FUNCTIONAL TESTS:  {Functional tests:24029}  PATIENT SURVEYS:  {rehab surveys:24030}                                                                                                                              TREATMENT DATE: ***  10/31/24: PT evaluation, patient education, and HEP   PATIENT EDUCATION: Education details: *** Person educated: {Person educated:25204} Education method: {Education Method:25205} Education comprehension: {Education Comprehension:25206}  HOME EXERCISE PROGRAM: ***  GOALS: Goals reviewed with patient? {yes/no:20286}  SHORT TERM GOALS: Target date: ***  *** Baseline: Goal status: INITIAL  2.  *** Baseline:  Goal status: INITIAL  3.  *** Baseline:  Goal status: INITIAL  4.  *** Baseline:  Goal status: INITIAL  5.  *** Baseline:  Goal status: INITIAL  6.   *** Baseline:  Goal status: INITIAL  LONG TERM GOALS: Target date: ***  *** Baseline:  Goal status: INITIAL  2.  *** Baseline:  Goal status: INITIAL  3.  ***  Baseline:  Goal status: INITIAL  4.  *** Baseline:  Goal status: INITIAL  5.  *** Baseline:  Goal status: INITIAL  6.  *** Baseline:  Goal status: INITIAL  ASSESSMENT:  CLINICAL IMPRESSION: Patient is a 89 y.o. male who was seen today for physical therapy evaluation and treatment for ***.   OBJECTIVE IMPAIRMENTS: {opptimpairments:25111}.   ACTIVITY LIMITATIONS: {activitylimitations:27494}  PARTICIPATION LIMITATIONS: {participationrestrictions:25113}  PERSONAL FACTORS: {Personal factors:25162} are also affecting patient's functional outcome.   REHAB POTENTIAL: {rehabpotential:25112}  CLINICAL DECISION MAKING: {clinical decision making:25114}  EVALUATION COMPLEXITY: {Evaluation complexity:25115}  PLAN:  PT FREQUENCY: {rehab frequency:25116}  PT DURATION: {rehab duration:25117}  PLANNED INTERVENTIONS: {rehab planned interventions:25118::97110-Therapeutic exercises,97530- Therapeutic [phone]- Neuromuscular re-education,97535- Self Rjmz,02859- Manual therapy,Patient/Family education}  PLAN FOR NEXT SESSION: PIERRETTE Lacinda JAYSON Elspeth, PT 10/31/2024, 7:25 AM

## 2024-10-31 NOTE — Therapy (Signed)
 OUTPATIENT PHYSICAL THERAPY NEURO EVALUATION   Patient Name: [name] MRN: [id number] DOB:[birth year], 89 y.o., male Today's Date: 10/31/2024   PCP: Sheryle Carwin, MD  REFERRING PROVIDER: Sheryle Carwin, MD   END OF SESSION:  PT End of Session - 10/31/24 0859     Visit Number 1    Number of Visits 12    Date for Recertification  01/19/25    Authorization Type Medicare part A & B with [employer] secondary    Authorization Time Period no auth required    Progress Note Due on Visit 10    PT Start Time 0903    PT Stop Time 0948    PT Time Calculation (min) 45 min    Activity Tolerance Patient tolerated treatment well    Behavior During Therapy [facility] for tasks assessed/performed          Past Medical History:  Diagnosis Date   Aortic stenosis    Status post TAVR at [facility], November 2019   CAD (coronary artery disease)    DES to ostial RCA October 2019 at [facility]   Essential hypertension    Glaucoma    Guillain-Barre syndrome 06/2013   Treated at [facility]   History of nephrolithiasis    History of pneumonia    Hyperlipidemia    Patent ductus arteriosus    Surgically repaired at age 56   Prostate cancer (HCC)    Type 2 diabetes mellitus (HCC)    Past Surgical History:  Procedure Laterality Date   CARPAL TUNNEL RELEASE  2012   left   CARPAL TUNNEL RELEASE Right 06/12/2014   Procedure: RIGHT CARPAL TUNNEL RELEASE ;  Surgeon: Arley JONELLE Curia, MD;  Location: [facility];  Service: Orthopedics;  Laterality: Right;   CATARACT EXTRACTION W/PHACO Left 10/13/2016   Procedure: CATARACT EXTRACTION PHACO AND INTRAOCULAR LENS PLACEMENT LEFT EYE CDE=15.04;  Surgeon: Oneil Platts, MD;  Location: [facility];  Service: Ophthalmology;  Laterality: Left;  left   CATARACT EXTRACTION W/PHACO Right 11/03/2016   Procedure: CATARACT EXTRACTION PHACO AND INTRAOCULAR LENS PLACEMENT (IOC);  Surgeon: Oneil Platts, MD;  Location: [facility];  Service: Ophthalmology;  Laterality: Right;  CDE: 15.10    COLONOSCOPY     KNEE ARTHROSCOPY  1962   left   Patent ductus arteriosus repair     1951 - age 42   ULNAR NERVE TRANSPOSITION Right 06/12/2014   Procedure: DECOMPRESSION ULNAR NERVE RIGHT ELBOW;  Surgeon: Arley JONELLE Curia, MD;  Location: [facility];  Service: Orthopedics;  Laterality: Right;   VIDEO ASSISTED THORACOSCOPY (VATS)/THOROCOTOMY  8/14   left-guillian-barre   Patient Active Problem List   Diagnosis Date Noted   Coronary artery disease due to lipid rich plaque 12/22/2022   S/P TAVR (transcatheter aortic valve replacement) 11/02/2018   PAD (peripheral artery disease) 11/01/2018   CHF (congestive heart failure), NYHA class II, chronic, diastolic (HCC) 10/31/2018   Diabetes mellitus type 2, uncomplicated (HCC) 04/06/2017   Primary open angle glaucoma of both eyes, moderate stage 04/06/2017   Pseudophakia 04/06/2017   Lower extremity weakness 10/23/2013   Difficulty walking 10/23/2013   Guillain-Barre 07/13/2013   DVT of lower extremity (deep venous thrombosis) (HCC) 07/07/2013   Dyspnea 07/04/2013   Hyponatremia 07/04/2013   Diabetes (HCC) 07/03/2013   Hypertension 07/03/2013   Hyperlipidemia 07/03/2013   Lung mass 07/03/2013   Rash 07/03/2013    ONSET DATE: 6-8 months ago  REFERRING DIAG: gait disturbance   THERAPY DIAG:  History of falling  Muscle weakness (generalized)  Rationale for Evaluation and Treatment: Rehabilitation  SUBJECTIVE:                                                                                                                                                                                             SUBJECTIVE STATEMENT: Patient feels that he has been walking around like I'm drunk. He feels that it has been getting worse for the past 6-8 months. He did not have a mechanism of injury. He did have Guillan-Barre in 2014 and he never got his legs back after that. He fell backwards yesterday, but he thinks his heel hit something  causing him to fall. He is not hurting from this fall. He has been able to get up without help after each of his falls. He had tried going to the [facility], but this did not seem to help. Pt accompanied by: self  PERTINENT HISTORY: CHF, HTN, type 2 diabetes, history of Guillain-Barre, glaucoma, and history  of cancer    PAIN:  Are you having pain? No  PRECAUTIONS: Fall  RED FLAGS: None   WEIGHT BEARING RESTRICTIONS: No  FALLS: Has patient fallen in last 6 months? Yes. Number of falls 3-4  LIVING ENVIRONMENT: Lives with: lives alone Lives in: House/apartment Stairs: Yes: Internal: 25 steps; can reach both and External: 6 steps; can reach both; step to pattern and avoids going upstairs inside home Has following equipment at home: Single point cane  PLOF: Independent  PATIENT GOALS: improved balance  OBJECTIVE:  Note: Objective measures were completed at Evaluation unless otherwise noted.  COGNITION: Overall cognitive status: Within functional limits for tasks assessed   SENSATION: Light touch: Impaired  and diminished sensation to light touch in bilateral lower extremities Patient reports that he has neuropathy in both legs from his hips down to his feet in both legs  COORDINATION: WFL  EDEMA:  No edema observed   MUSCLE TONE: WFL for activities assessed  POSTURE: forward head and flexed trunk   LOWER EXTREMITY ROM: WFL for activities assessed  LOWER EXTREMITY MMT:    MMT Right Eval Left Eval  Hip flexion 4-/5 3+/5  Hip extension    Hip abduction    Hip adduction 4+/5 4+/5  Hip internal rotation    Hip external rotation    Knee flexion 4/5 4/5  Knee extension 4/5 3+/5  Ankle dorsiflexion 3/5 3/5  Ankle plantarflexion    Ankle inversion    Ankle eversion    (Blank rows = not tested)  TRANSFERS: Sit to stand: unable to complete without UE support from armrests  GAIT: Findings: Gait Characteristics: step  through pattern, decreased stride length, decreased  hip/knee flexion- Right, decreased hip/knee flexion- Left, Right foot flat, Left foot flat, poor foot clearance- Right, and poor foot clearance- Left, Assistive device utilized:Single point cane, Level of assistance: SBA, and Comments: patient utilized the [facility] in one hand and had the other hand on the wall for balance  FUNCTIONAL TESTS:  5 times sit to stand: 47.76 seconds; requires UE support from armrests  Timed up and go (TUG): assessed at next appointment, as able 2 minute walk test: assessed at next appointment, as able  PATIENT SURVEYS:  ABC scale: The Activities-Specific Balance Confidence (ABC) Scale 0% 10 20 30  40 50 60 70 80 90 100% No confidence<->completely confident  "How confident are you that you will not lose your balance or become unsteady when you . . .   Date tested 10/31/24  Walk around the house 100%  2. Walk up or down stairs 100%  3. Bend over and pick up a slipper from in front of a closet floor 0%  4. Reach for a small can off a shelf at eye level 100%  5. Stand on tip toes and reach for something above your head 50%  6. Stand on a chair and reach for something 0%  7. Sweep the floor 0%  8. Walk outside the house to a car parked in the driveway 100%  9. Get into or out of a car 100%  10. Walk across a parking lot to the mall 50%  11. Walk up or down a ramp 25%  12. Walk in a crowded mall where people rapidly walk past you 25%  13. Are bumped into by people as you walk through the mall 50%  14. Step onto or off of an escalator while you are holding onto the railing 100%  15. Step onto or off an escalator while holding onto parcels such that you cannot hold onto the railing 0%  16. Walk outside on icy sidewalks 0%  Total: #/16  50%                                                                                                                                  TREATMENT DATE:   10/31/24: PT evaluation, patient education, and HEP   PATIENT  EDUCATION: Education details: HEP, POC, prognosis, objective findings, and goals for physical therapy Person educated: Patient Education method: Explanation, Demonstration, and Handouts Education comprehension: verbalized understanding and returned demonstration  HOME EXERCISE PROGRAM: Access Code: [email or web address] URL: [email or web address] Date: 10/31/2024 Prepared by: Lacinda Fass  Exercises - Seated March  - 1-2 x daily - 7 x weekly - 3 sets - 10 reps - Seated Long Arc Quad  - 1-2 x daily - 7 x weekly - 3 sets - 10 reps - Seated Heel Toe Raises  - 1-2 x daily - 7 x weekly - 3 sets - 10 reps  GOALS: Goals reviewed with patient? Yes  SHORT TERM GOALS: Target date: 11/21/24  Patient will be independent with his initial HEP. Baseline: Goal status: INITIAL  2.  Patient will be able to transfer from sitting to standing without upper extremity support for improved independence. Baseline:  Goal status: INITIAL  3.  Patient will improve his 5 times sit to stand time to 30 seconds or less for improved lower extremity power. Baseline:  Goal status: INITIAL  LONG TERM GOALS: Target date: 12/12/24  Patient will be independent with his advanced HEP. Baseline:  Goal status: INITIAL  2.  Patient will improve his 5 times sit to stand time to 20 seconds or less for improved functional mobility. Baseline:  Goal status: INITIAL  3.  Patient will improve his ABC scale by at least 15% for improved perceived function with his daily activities. Baseline:  Goal status: INITIAL  4.  Patient will be able to navigate at least 4 steps while utilizing only 1 railing for improved household mobility. Baseline:  Goal status: INITIAL  ASSESSMENT:  CLINICAL IMPRESSION: Patient is a 89 y.o. male who was seen today for physical therapy evaluation and treatment for unsteadiness on his feet. He is a high fall risk as evidenced by his gait mechanics, objective and functional testing, and his  history of falling. He also exhibited diminished sensation to light touch in both lower extremities. Recommend that he continue with skilled physical therapy to address his impairments to maximize his safety and functional mobility.    OBJECTIVE IMPAIRMENTS: Abnormal gait, decreased activity tolerance, decreased balance, decreased mobility, difficulty walking, and decreased strength.   ACTIVITY LIMITATIONS: standing, squatting, stairs, transfers, and locomotion level  PARTICIPATION LIMITATIONS: driving, shopping, community activity, and yard work  PERSONAL FACTORS: Age, Behavior pattern, Past/current experiences, Time since onset of injury/illness/exacerbation, and 3+ comorbidities: CHF, HTN, type 2 diabetes, history of Guillain-Barre, glaucoma, and history  of cancer   are also affecting patient's functional outcome.   REHAB POTENTIAL: Fair    CLINICAL DECISION MAKING: Evolving/moderate complexity  EVALUATION COMPLEXITY: Moderate  PLAN:  PT FREQUENCY: 2x/week  PT DURATION: 6 weeks  PLANNED INTERVENTIONS: 97164- PT Re-evaluation, 97750- Physical Performance Testing, 97110-Therapeutic exercises, 97530- Therapeutic activity, 97112- Neuromuscular re-education, 97535- Self Care, 02859- Manual therapy, [phone]- Gait training, Patient/Family education, Balance training, and Stair training  PLAN FOR NEXT SESSION: 2MWT/TUG/DGI (as able), gait training, balance interventions, and lower extremity strengthening (utilize gait belt for standing interventions due to high fall risk)    Lacinda JAYSON Fass, PT 10/31/2024, 12:40 PM

## 2024-11-07 ENCOUNTER — Encounter (HOSPITAL_COMMUNITY): Payer: Self-pay

## 2024-11-07 ENCOUNTER — Ambulatory Visit (HOSPITAL_COMMUNITY)

## 2024-11-07 DIAGNOSIS — Z9181 History of falling: Secondary | ICD-10-CM | POA: Diagnosis not present

## 2024-11-07 DIAGNOSIS — M6281 Muscle weakness (generalized): Secondary | ICD-10-CM

## 2024-11-07 NOTE — Therapy (Signed)
 OUTPATIENT PHYSICAL THERAPY NEURO TREATMENT   Patient Name: [name] MRN: [id number] DOB:04-02-1929, 89 y.o., male Today's Date: 11/07/2024   PCP: Sheryle Carwin, MD  REFERRING PROVIDER: Sheryle Carwin, MD   END OF SESSION:  PT End of Session - 11/07/24 0730     Visit Number 2    Number of Visits 12    Date for Recertification  01/19/25    Authorization Type Medicare part A & B with [employer] secondary    Authorization Time Period no auth required    Progress Note Due on Visit 10    PT Start Time 0730    PT Stop Time 0812    PT Time Calculation (min) 42 min    Activity Tolerance Patient tolerated treatment well    Behavior During Therapy [facility] for tasks assessed/performed           Past Medical History:  Diagnosis Date   Aortic stenosis    Status post TAVR at [facility], November 2019   CAD (coronary artery disease)    DES to ostial RCA October 2019 at [facility]   Essential hypertension    Glaucoma    Guillain-Barre syndrome 06/2013   Treated at [facility]   History of nephrolithiasis    History of pneumonia    Hyperlipidemia    Patent ductus arteriosus    Surgically repaired at age 70   Prostate cancer (HCC)    Type 2 diabetes mellitus (HCC)    Past Surgical History:  Procedure Laterality Date   CARPAL TUNNEL RELEASE  2012   left   CARPAL TUNNEL RELEASE Right 06/12/2014   Procedure: RIGHT CARPAL TUNNEL RELEASE ;  Surgeon: Arley JONELLE Curia, MD;  Location: [facility];  Service: Orthopedics;  Laterality: Right;   CATARACT EXTRACTION W/PHACO Left 10/13/2016   Procedure: CATARACT EXTRACTION PHACO AND INTRAOCULAR LENS PLACEMENT LEFT EYE CDE=15.04;  Surgeon: Oneil Platts, MD;  Location: [facility];  Service: Ophthalmology;  Laterality: Left;  left   CATARACT EXTRACTION W/PHACO Right 11/03/2016   Procedure: CATARACT EXTRACTION PHACO AND INTRAOCULAR LENS PLACEMENT (IOC);  Surgeon: Oneil Platts, MD;  Location: [facility];  Service: Ophthalmology;  Laterality: Right;  CDE: 15.10    COLONOSCOPY     KNEE ARTHROSCOPY  1962   left   Patent ductus arteriosus repair     1951 - age 26   ULNAR NERVE TRANSPOSITION Right 06/12/2014   Procedure: DECOMPRESSION ULNAR NERVE RIGHT ELBOW;  Surgeon: Arley JONELLE Curia, MD;  Location: [facility];  Service: Orthopedics;  Laterality: Right;   VIDEO ASSISTED THORACOSCOPY (VATS)/THOROCOTOMY  8/14   left-guillian-barre   Patient Active Problem List   Diagnosis Date Noted   Coronary artery disease due to lipid rich plaque 12/22/2022   S/P TAVR (transcatheter aortic valve replacement) 11/02/2018   PAD (peripheral artery disease) 11/01/2018   CHF (congestive heart failure), NYHA class II, chronic, diastolic (HCC) 10/31/2018   Diabetes mellitus type 2, uncomplicated (HCC) 04/06/2017   Primary open angle glaucoma of both eyes, moderate stage 04/06/2017   Pseudophakia 04/06/2017   Lower extremity weakness 10/23/2013   Difficulty walking 10/23/2013   Guillain-Barre 07/13/2013   DVT of lower extremity (deep venous thrombosis) (HCC) 07/07/2013   Dyspnea 07/04/2013   Hyponatremia 07/04/2013   Diabetes (HCC) 07/03/2013   Hypertension 07/03/2013   Hyperlipidemia 07/03/2013   Lung mass 07/03/2013   Rash 07/03/2013    ONSET DATE: 6-8 months ago  REFERRING DIAG: gait disturbance   THERAPY DIAG:  History of  falling  Muscle weakness (generalized)  Rationale for Evaluation and Treatment: Rehabilitation  SUBJECTIVE:                                                                                                                                                                                             SUBJECTIVE STATEMENT: Patient reports that he made himself sore by doing his HEP 4-5 times on Saturday. He is still a little sore today. He has not fallen since his last appointment.   Eval: Patient feels that he has been walking around like I'm drunk. He feels that it has been getting worse for the past 6-8 months. He did not have  a mechanism of injury. He did have Guillan-Barre in 2014 and he never got his legs back after that. He fell backwards yesterday, but he thinks his heel hit something causing him to fall. He is not hurting from this fall. He has been able to get up without help after each of his falls. He had tried going to the [facility], but this did not seem to help. Pt accompanied by: self  PERTINENT HISTORY: CHF, HTN, type 2 diabetes, history of Guillain-Barre, glaucoma, and history  of cancer    PAIN:  Are you having pain? No  PRECAUTIONS: Fall  RED FLAGS: None   WEIGHT BEARING RESTRICTIONS: No  FALLS: Has patient fallen in last 6 months? Yes. Number of falls 3-4  LIVING ENVIRONMENT: Lives with: lives alone Lives in: House/apartment Stairs: Yes: Internal: 25 steps; can reach both and External: 6 steps; can reach both; step to pattern and avoids going upstairs inside home Has following equipment at home: Single point cane  PLOF: Independent  PATIENT GOALS: improved balance  OBJECTIVE:  Note: Objective measures were completed at Evaluation unless otherwise noted.  COGNITION: Overall cognitive status: Within functional limits for tasks assessed   SENSATION: Light touch: Impaired  and diminished sensation to light touch in bilateral lower extremities Patient reports that he has neuropathy in both legs from his hips down to his feet in both legs  COORDINATION: WFL  EDEMA:  No edema observed   MUSCLE TONE: WFL for activities assessed  POSTURE: forward head and flexed trunk   LOWER EXTREMITY ROM: WFL for activities assessed  LOWER EXTREMITY MMT:    MMT Right Eval Left Eval  Hip flexion 4-/5 3+/5  Hip extension    Hip abduction    Hip adduction 4+/5 4+/5  Hip internal rotation    Hip external rotation    Knee flexion 4/5 4/5  Knee extension 4/5 3+/5  Ankle dorsiflexion 3/5 3/5  Ankle plantarflexion  Ankle inversion    Ankle eversion    (Blank rows = not  tested)  TRANSFERS: Sit to stand: unable to complete without UE support from armrests  GAIT: Findings: Gait Characteristics: step through pattern, decreased stride length, decreased hip/knee flexion- Right, decreased hip/knee flexion- Left, Right foot flat, Left foot flat, poor foot clearance- Right, and poor foot clearance- Left, Assistive device utilized:Single point cane, Level of assistance: SBA, and Comments: patient utilized the [facility] in one hand and had the other hand on the wall for balance  FUNCTIONAL TESTS:  5 times sit to stand: 47.76 seconds; requires UE support from armrests  Timed up and go (TUG): 11/07/24: 41.28 seconds with cane 2 minute walk test: assessed at next appointment, as able  PATIENT SURVEYS:  ABC scale: The Activities-Specific Balance Confidence (ABC) Scale 0% 10 20 30  40 50 60 70 80 90 100% No confidence<->completely confident  "How confident are you that you will not lose your balance or become unsteady when you . . .   Date tested 10/31/24  Walk around the house 100%  2. Walk up or down stairs 100%  3. Bend over and pick up a slipper from in front of a closet floor 0%  4. Reach for a small can off a shelf at eye level 100%  5. Stand on tip toes and reach for something above your head 50%  6. Stand on a chair and reach for something 0%  7. Sweep the floor 0%  8. Walk outside the house to a car parked in the driveway 100%  9. Get into or out of a car 100%  10. Walk across a parking lot to the mall 50%  11. Walk up or down a ramp 25%  12. Walk in a crowded mall where people rapidly walk past you 25%  13. Are bumped into by people as you walk through the mall 50%  14. Step onto or off of an escalator while you are holding onto the railing 100%  15. Step onto or off an escalator while holding onto parcels such that you cannot hold onto the railing 0%  16. Walk outside on icy sidewalks 0%  Total: #/16  50%                                                                                                                                   TREATMENT DATE:                                     11/07/24 EXERCISE LOG  Exercise Repetitions and Resistance Comments  Nustep  L3 x 7 minutes BUE and BLE   LAQ 3# x 2 x 10 reps Alternating LE   Seated march   3# x 2 x 10 reps  Alternating LE   Seated hip ADD isometric  2 minutes  w/ 5 second hold    Rocker board  2.5 minutes  Seated   Seated HS curls  RTB x 20 reps each    TUG  41.28 seconds  With cane and CGA    Blank cell = exercise not performed today   10/31/24: PT evaluation, patient education, and HEP   PATIENT EDUCATION: Education details: muscle strengthening, POC, HEP, and expectation for soreness  Person educated: Patient Education method: Explanation, Demonstration, and Handouts Education comprehension: verbalized understanding and returned demonstration  HOME EXERCISE PROGRAM: Access Code: [email or web address] URL: [email or web address] Date: 10/31/2024 Prepared by: Lacinda Fass  Exercises - Seated March  - 1-2 x daily - 7 x weekly - 3 sets - 10 reps - Seated Long Arc Quad  - 1-2 x daily - 7 x weekly - 3 sets - 10 reps - Seated Heel Toe Raises  - 1-2 x daily - 7 x weekly - 3 sets - 10 reps  GOALS: Goals reviewed with patient? Yes  SHORT TERM GOALS: Target date: 11/21/24  Patient will be independent with his initial HEP. Baseline: Goal status: INITIAL  2.  Patient will be able to transfer from sitting to standing without upper extremity support for improved independence. Baseline:  Goal status: INITIAL  3.  Patient will improve his 5 times sit to stand time to 30 seconds or less for improved lower extremity power. Baseline:  Goal status: INITIAL  LONG TERM GOALS: Target date: 12/12/24  Patient will be independent with his advanced HEP. Baseline:  Goal status: INITIAL  2.  Patient will improve his 5 times sit to stand time to 20 seconds or less for improved functional  mobility. Baseline:  Goal status: INITIAL  3.  Patient will improve his ABC scale by at least 15% for improved perceived function with his daily activities. Baseline:  Goal status: INITIAL  4.  Patient will be able to navigate at least 4 steps while utilizing only 1 railing for improved household mobility. Baseline:  Goal status: INITIAL  ASSESSMENT:  CLINICAL IMPRESSION: Patient was introduced to multiple new interventions for improved lower extremity strength for improved functional mobility. He required minimal cueing with today's new interventions for proper exercise performance. He was educated on the expectations for soreness and proper performance of his HEP to avoid a significant increase in soreness. He reported feeling alright upon the conclusion of treatment. Patient continues to require skilled physical therapy to address her remaining impairments to maximize his safety and functional mobility.   Eval: Patient is a 89 y.o. male who was seen today for physical therapy evaluation and treatment for unsteadiness on his feet. He is a high fall risk as evidenced by his gait mechanics, objective and functional testing, and his history of falling. He also exhibited diminished sensation to light touch in both lower extremities. Recommend that he continue with skilled physical therapy to address his impairments to maximize his safety and functional mobility.    OBJECTIVE IMPAIRMENTS: Abnormal gait, decreased activity tolerance, decreased balance, decreased mobility, difficulty walking, and decreased strength.   ACTIVITY LIMITATIONS: standing, squatting, stairs, transfers, and locomotion level  PARTICIPATION LIMITATIONS: driving, shopping, community activity, and yard work  PERSONAL FACTORS: Age, Behavior pattern, Past/current experiences, Time since onset of injury/illness/exacerbation, and 3+ comorbidities: CHF, HTN, type 2 diabetes, history of Guillain-Barre, glaucoma, and history  of  cancer   are also affecting patient's functional outcome.   REHAB POTENTIAL: Fair    CLINICAL DECISION MAKING: Evolving/moderate complexity  EVALUATION COMPLEXITY: Moderate  PLAN:  PT FREQUENCY: 2x/week  PT DURATION: 6 weeks  PLANNED INTERVENTIONS: 97164- PT Re-evaluation, 97750- Physical Performance Testing, 97110-Therapeutic exercises, 97530- Therapeutic activity, 97112- Neuromuscular re-education, 97535- Self Care, [id number]- Manual therapy, [phone]- Gait training, Patient/Family education, Balance training, and Stair training  PLAN FOR NEXT SESSION: 2MWT/TUG/DGI (as able), gait training, balance interventions, and lower extremity strengthening (utilize gait belt for standing interventions due to high fall risk)    Lacinda JAYSON Fass, PT 11/07/2024, 12:26 PM

## 2024-11-15 ENCOUNTER — Ambulatory Visit (HOSPITAL_COMMUNITY)

## 2024-11-15 DIAGNOSIS — L570 Actinic keratosis: Secondary | ICD-10-CM | POA: Diagnosis not present

## 2024-11-15 DIAGNOSIS — X32XXXD Exposure to sunlight, subsequent encounter: Secondary | ICD-10-CM | POA: Diagnosis not present

## 2024-11-15 DIAGNOSIS — Z08 Encounter for follow-up examination after completed treatment for malignant neoplasm: Secondary | ICD-10-CM | POA: Diagnosis not present

## 2024-11-15 DIAGNOSIS — Z85828 Personal history of other malignant neoplasm of skin: Secondary | ICD-10-CM | POA: Diagnosis not present

## 2024-11-21 ENCOUNTER — Ambulatory Visit (HOSPITAL_COMMUNITY)

## 2024-11-21 DIAGNOSIS — Z9181 History of falling: Secondary | ICD-10-CM | POA: Insufficient documentation

## 2024-11-21 DIAGNOSIS — M6281 Muscle weakness (generalized): Secondary | ICD-10-CM | POA: Diagnosis present

## 2024-11-21 NOTE — Therapy (Signed)
 OUTPATIENT PHYSICAL THERAPY NEURO TREATMENT   Patient Name: [name] MRN: [id number] DOB:03-21-1929, 89 y.o., male Today's Date: 11/21/2024   PCP: Sheryle Carwin, MD  REFERRING PROVIDER: Sheryle Carwin, MD   END OF SESSION:  PT End of Session - 11/21/24 1042     Visit Number 3    Number of Visits 12    Date for Recertification  01/19/25    Authorization Type Medicare part A & B with [employer] secondary    Authorization Time Period no auth required    Progress Note Due on Visit 10    PT Start Time 1039    PT Stop Time 1115    PT Time Calculation (min) 36 min    Activity Tolerance Patient tolerated treatment well    Behavior During Therapy [facility] for tasks assessed/performed           Past Medical History:  Diagnosis Date   Aortic stenosis    Status post TAVR at [facility], November 2019   CAD (coronary artery disease)    DES to ostial RCA October 2019 at [facility]   Essential hypertension    Glaucoma    Guillain-Barre syndrome 06/2013   Treated at [facility]   History of nephrolithiasis    History of pneumonia    Hyperlipidemia    Patent ductus arteriosus    Surgically repaired at age 54   Prostate cancer (HCC)    Type 2 diabetes mellitus (HCC)    Past Surgical History:  Procedure Laterality Date   CARPAL TUNNEL RELEASE  2012   left   CARPAL TUNNEL RELEASE Right 06/12/2014   Procedure: RIGHT CARPAL TUNNEL RELEASE ;  Surgeon: Arley JONELLE Curia, MD;  Location: [facility];  Service: Orthopedics;  Laterality: Right;   CATARACT EXTRACTION W/PHACO Left 10/13/2016   Procedure: CATARACT EXTRACTION PHACO AND INTRAOCULAR LENS PLACEMENT LEFT EYE CDE=15.04;  Surgeon: Oneil Platts, MD;  Location: [facility];  Service: Ophthalmology;  Laterality: Left;  left   CATARACT EXTRACTION W/PHACO Right 11/03/2016   Procedure: CATARACT EXTRACTION PHACO AND INTRAOCULAR LENS PLACEMENT (IOC);  Surgeon: Oneil Platts, MD;  Location: [facility];  Service: Ophthalmology;  Laterality: Right;  CDE: 15.10    COLONOSCOPY     KNEE ARTHROSCOPY  1962   left   Patent ductus arteriosus repair     1951 - age 41   ULNAR NERVE TRANSPOSITION Right 06/12/2014   Procedure: DECOMPRESSION ULNAR NERVE RIGHT ELBOW;  Surgeon: Arley JONELLE Curia, MD;  Location: [facility];  Service: Orthopedics;  Laterality: Right;   VIDEO ASSISTED THORACOSCOPY (VATS)/THOROCOTOMY  8/14   left-guillian-barre   Patient Active Problem List   Diagnosis Date Noted   Coronary artery disease due to lipid rich plaque 12/22/2022   S/P TAVR (transcatheter aortic valve replacement) 11/02/2018   PAD (peripheral artery disease) 11/01/2018   CHF (congestive heart failure), NYHA class II, chronic, diastolic (HCC) 10/31/2018   Diabetes mellitus type 2, uncomplicated (HCC) 04/06/2017   Primary open angle glaucoma of both eyes, moderate stage 04/06/2017   Pseudophakia 04/06/2017   Lower extremity weakness 10/23/2013   Difficulty walking 10/23/2013   Guillain-Barre 07/13/2013   DVT of lower extremity (deep venous thrombosis) (HCC) 07/07/2013   Dyspnea 07/04/2013   Hyponatremia 07/04/2013   Diabetes (HCC) 07/03/2013   Hypertension 07/03/2013   Hyperlipidemia 07/03/2013   Lung mass 07/03/2013   Rash 07/03/2013    ONSET DATE: 6-8 months ago  REFERRING DIAG: gait disturbance   THERAPY DIAG:  History of  falling  Muscle weakness (generalized)  Rationale for Evaluation and Treatment: Rehabilitation  SUBJECTIVE:                                                                                                                                                                                             SUBJECTIVE STATEMENT: Late arrival; reports he had a fall on Sunday; took me about 30 minutes to get up  Eval: Patient feels that he has been walking around like I'm drunk. He feels that it has been getting worse for the past 6-8 months. He did not have a mechanism of injury. He did have Guillan-Barre in 2014 and he never got his  legs back after that. He fell backwards yesterday, but he thinks his heel hit something causing him to fall. He is not hurting from this fall. He has been able to get up without help after each of his falls. He had tried going to the [facility], but this did not seem to help. Pt accompanied by: self  PERTINENT HISTORY: CHF, HTN, type 2 diabetes, history of Guillain-Barre, glaucoma, and history  of cancer    PAIN:  Are you having pain? No  PRECAUTIONS: Fall  RED FLAGS: None   WEIGHT BEARING RESTRICTIONS: No  FALLS: Has patient fallen in last 6 months? Yes. Number of falls 3-4  LIVING ENVIRONMENT: Lives with: lives alone Lives in: House/apartment Stairs: Yes: Internal: 25 steps; can reach both and External: 6 steps; can reach both; step to pattern and avoids going upstairs inside home Has following equipment at home: Single point cane  PLOF: Independent  PATIENT GOALS: improved balance  OBJECTIVE:  Note: Objective measures were completed at Evaluation unless otherwise noted.  COGNITION: Overall cognitive status: Within functional limits for tasks assessed   SENSATION: Light touch: Impaired  and diminished sensation to light touch in bilateral lower extremities Patient reports that he has neuropathy in both legs from his hips down to his feet in both legs  COORDINATION: WFL  EDEMA:  No edema observed   MUSCLE TONE: WFL for activities assessed  POSTURE: forward head and flexed trunk   LOWER EXTREMITY ROM: WFL for activities assessed  LOWER EXTREMITY MMT:    MMT Right Eval Left Eval  Hip flexion 4-/5 3+/5  Hip extension    Hip abduction    Hip adduction 4+/5 4+/5  Hip internal rotation    Hip external rotation    Knee flexion 4/5 4/5  Knee extension 4/5 3+/5  Ankle dorsiflexion 3/5 3/5  Ankle plantarflexion    Ankle inversion    Ankle eversion    (Blank rows =  not tested)  TRANSFERS: Sit to stand: unable to complete without UE support from  armrests  GAIT: Findings: Gait Characteristics: step through pattern, decreased stride length, decreased hip/knee flexion- Right, decreased hip/knee flexion- Left, Right foot flat, Left foot flat, poor foot clearance- Right, and poor foot clearance- Left, Assistive device utilized:Single point cane, Level of assistance: SBA, and Comments: patient utilized the [facility] in one hand and had the other hand on the wall for balance  FUNCTIONAL TESTS:  5 times sit to stand: 47.76 seconds; requires UE support from armrests  Timed up and go (TUG): 11/07/24: 41.28 seconds with cane 2 minute walk test: assessed at next appointment, as able  PATIENT SURVEYS:  ABC scale: The Activities-Specific Balance Confidence (ABC) Scale 0% 10 20 30  40 50 60 70 80 90 100% No confidence<->completely confident  "How confident are you that you will not lose your balance or become unsteady when you . . .   Date tested 10/31/24  Walk around the house 100%  2. Walk up or down stairs 100%  3. Bend over and pick up a slipper from in front of a closet floor 0%  4. Reach for a small can off a shelf at eye level 100%  5. Stand on tip toes and reach for something above your head 50%  6. Stand on a chair and reach for something 0%  7. Sweep the floor 0%  8. Walk outside the house to a car parked in the driveway 100%  9. Get into or out of a car 100%  10. Walk across a parking lot to the mall 50%  11. Walk up or down a ramp 25%  12. Walk in a crowded mall where people rapidly walk past you 25%  13. Are bumped into by people as you walk through the mall 50%  14. Step onto or off of an escalator while you are holding onto the railing 100%  15. Step onto or off an escalator while holding onto parcels such that you cannot hold onto the railing 0%  16. Walk outside on icy sidewalks 0%  Total: #/16  50%                                                                                                                                   TREATMENT DATE:   11/21/24 2 MWT 159 ft with SPC Sit to stand with foam in chair without UE assist x 5 (uses legs on back of chair to assist) Trial of standing heel raises and toe raises with minimal available active AROM Squat x 10 in // bars Slant board 5 x 20 2# marching 2 x 10 2# sidestepping length of // bars x 2                                    11/07/24 EXERCISE  LOG  Exercise Repetitions and Resistance Comments  Nustep  L3 x 7 minutes BUE and BLE   LAQ 3# x 2 x 10 reps Alternating LE   Seated march   3# x 2 x 10 reps  Alternating LE   Seated hip ADD isometric  2 minutes w/ 5 second hold    Rocker board  2.5 minutes  Seated   Seated HS curls  RTB x 20 reps each    TUG  41.28 seconds  With cane and CGA    Blank cell = exercise not performed today   10/31/24: PT evaluation, patient education, and HEP   PATIENT EDUCATION: Education details: muscle strengthening, POC, HEP, and expectation for soreness  Person educated: Patient Education method: Explanation, Demonstration, and Handouts Education comprehension: verbalized understanding and returned demonstration  HOME EXERCISE PROGRAM: Access Code: [email or web address] URL: [email or web address] Date: 10/31/2024 Prepared by: Lacinda Fass  Exercises - Seated March  - 1-2 x daily - 7 x weekly - 3 sets - 10 reps - Seated Long Arc Quad  - 1-2 x daily - 7 x weekly - 3 sets - 10 reps - Seated Heel Toe Raises  - 1-2 x daily - 7 x weekly - 3 sets - 10 reps  GOALS: Goals reviewed with patient? Yes  SHORT TERM GOALS: Target date: 11/21/24  Patient will be independent with his initial HEP. Baseline: Goal status: INITIAL  2.  Patient will be able to transfer from sitting to standing without upper extremity support for improved independence. Baseline:  Goal status: INITIAL  3.  Patient will improve his 5 times sit to stand time to 30 seconds or less for improved lower extremity power. Baseline:  Goal status:  INITIAL  LONG TERM GOALS: Target date: 12/12/24  Patient will be independent with his advanced HEP. Baseline:  Goal status: INITIAL  2.  Patient will improve his 5 times sit to stand time to 20 seconds or less for improved functional mobility. Baseline:  Goal status: INITIAL  3.  Patient will improve his ABC scale by at least 15% for improved perceived function with his daily activities. Baseline:  Goal status: INITIAL  4.  Patient will be able to navigate at least 4 steps while utilizing only 1 railing for improved household mobility. Baseline:  Goal status: INITIAL  ASSESSMENT:  CLINICAL IMPRESSION:  Late arrival.  Today's session started with a 2 min MWT.  Shuffing gait and sometimes catches his toe; also tends to speed up a couple of steps; losing control on his pacing.  Able to perform sit to stand with foam in chair without UE but tends to use the back of his legs to help maintain balance.  Mostly has uncontrolled descent back to the chair but improves with cues.  Noted limited ankle mobility with heel and toe raise attempts which increases fall risk due to very limited ankle strategy.  Patient continues to require skilled physical therapy to address his remaining impairments to maximize his safety and functional mobility.   Eval: Patient is a 89 y.o. male who was seen today for physical therapy evaluation and treatment for unsteadiness on his feet. He is a high fall risk as evidenced by his gait mechanics, objective and functional testing, and his history of falling. He also exhibited diminished sensation to light touch in both lower extremities. Recommend that he continue with skilled physical therapy to address his impairments to maximize his safety and functional mobility.    OBJECTIVE IMPAIRMENTS: Abnormal gait, decreased activity  tolerance, decreased balance, decreased mobility, difficulty walking, and decreased strength.   ACTIVITY LIMITATIONS: standing, squatting, stairs,  transfers, and locomotion level  PARTICIPATION LIMITATIONS: driving, shopping, community activity, and yard work  PERSONAL FACTORS: Age, Behavior pattern, Past/current experiences, Time since onset of injury/illness/exacerbation, and 3+ comorbidities: CHF, HTN, type 2 diabetes, history of Guillain-Barre, glaucoma, and history  of cancer   are also affecting patient's functional outcome.   REHAB POTENTIAL: Fair    CLINICAL DECISION MAKING: Evolving/moderate complexity  EVALUATION COMPLEXITY: Moderate  PLAN:  PT FREQUENCY: 2x/week  PT DURATION: 6 weeks  PLANNED INTERVENTIONS: 97164- PT Re-evaluation, 97750- Physical Performance Testing, 97110-Therapeutic exercises, 97530- Therapeutic activity, 97112- Neuromuscular re-education, 97535- Self Care, [id number]- Manual therapy, [phone]- Gait training, Patient/Family education, Balance training, and Stair training  PLAN FOR NEXT SESSION: TUG/DGI (as able), gait training, balance interventions, and lower extremity strengthening (utilize gait belt for standing interventions due to high fall risk) ankle mobility   11:18 AM, 11/21/24 Pietrina Jagodzinski Small Yomara Toothman MPT [facility] [place] [phone] Ph:[phone]

## 2024-11-23 ENCOUNTER — Encounter (HOSPITAL_COMMUNITY): Payer: Self-pay

## 2024-11-23 ENCOUNTER — Ambulatory Visit (HOSPITAL_COMMUNITY)

## 2024-11-23 DIAGNOSIS — Z9181 History of falling: Secondary | ICD-10-CM | POA: Diagnosis not present

## 2024-11-23 DIAGNOSIS — M6281 Muscle weakness (generalized): Secondary | ICD-10-CM

## 2024-11-23 NOTE — Therapy (Signed)
 OUTPATIENT PHYSICAL THERAPY NEURO TREATMENT   Patient Name: [name] MRN: [id number] DOB:[birth year], 89 y.o., male Today's Date: 11/23/2024   PCP: Sheryle Carwin, MD  REFERRING PROVIDER: Sheryle Carwin, MD   END OF SESSION:  PT End of Session - 11/23/24 0732     Visit Number 4    Number of Visits 12    Date for Recertification  01/19/25    Authorization Type Medicare part A & B with [employer] secondary    Authorization Time Period no auth required    Progress Note Due on Visit 10    PT Start Time 0733    PT Stop Time 0811    PT Time Calculation (min) 38 min    Activity Tolerance Patient tolerated treatment well    Behavior During Therapy [facility] for tasks assessed/performed           Past Medical History:  Diagnosis Date   Aortic stenosis    Status post TAVR at [facility], November 2019   CAD (coronary artery disease)    DES to ostial RCA October 2019 at [facility]   Essential hypertension    Glaucoma    Guillain-Barre syndrome 06/2013   Treated at [facility]   History of nephrolithiasis    History of pneumonia    Hyperlipidemia    Patent ductus arteriosus    Surgically repaired at age 50   Prostate cancer (HCC)    Type 2 diabetes mellitus (HCC)    Past Surgical History:  Procedure Laterality Date   CARPAL TUNNEL RELEASE  2012   left   CARPAL TUNNEL RELEASE Right 06/12/2014   Procedure: RIGHT CARPAL TUNNEL RELEASE ;  Surgeon: Arley JONELLE Curia, MD;  Location: [facility];  Service: Orthopedics;  Laterality: Right;   CATARACT EXTRACTION W/PHACO Left 10/13/2016   Procedure: CATARACT EXTRACTION PHACO AND INTRAOCULAR LENS PLACEMENT LEFT EYE CDE=15.04;  Surgeon: Oneil Platts, MD;  Location: [facility];  Service: Ophthalmology;  Laterality: Left;  left   CATARACT EXTRACTION W/PHACO Right 11/03/2016   Procedure: CATARACT EXTRACTION PHACO AND INTRAOCULAR LENS PLACEMENT (IOC);  Surgeon: Oneil Platts, MD;  Location: [facility];  Service: Ophthalmology;  Laterality: Right;  CDE: 15.10    COLONOSCOPY     KNEE ARTHROSCOPY  1962   left   Patent ductus arteriosus repair     1951 - age 89   ULNAR NERVE TRANSPOSITION Right 06/12/2014   Procedure: DECOMPRESSION ULNAR NERVE RIGHT ELBOW;  Surgeon: Arley JONELLE Curia, MD;  Location: [facility];  Service: Orthopedics;  Laterality: Right;   VIDEO ASSISTED THORACOSCOPY (VATS)/THOROCOTOMY  8/14   left-guillian-barre   Patient Active Problem List   Diagnosis Date Noted   Coronary artery disease due to lipid rich plaque 12/22/2022   S/P TAVR (transcatheter aortic valve replacement) 11/02/2018   PAD (peripheral artery disease) 11/01/2018   CHF (congestive heart failure), NYHA class II, chronic, diastolic (HCC) 10/31/2018   Diabetes mellitus type 2, uncomplicated (HCC) 04/06/2017   Primary open angle glaucoma of both eyes, moderate stage 04/06/2017   Pseudophakia 04/06/2017   Lower extremity weakness 10/23/2013   Difficulty walking 10/23/2013   Guillain-Barre 07/13/2013   DVT of lower extremity (deep venous thrombosis) (HCC) 07/07/2013   Dyspnea 07/04/2013   Hyponatremia 07/04/2013   Diabetes (HCC) 07/03/2013   Hypertension 07/03/2013   Hyperlipidemia 07/03/2013   Lung mass 07/03/2013   Rash 07/03/2013    ONSET DATE: 6-8 months ago  REFERRING DIAG: gait disturbance   THERAPY DIAG:  History of  falling  Muscle weakness (generalized)  Rationale for Evaluation and Treatment: Rehabilitation  SUBJECTIVE:                                                                                                                                                                                             SUBJECTIVE STATEMENT: Reports he was tired for 2 days following last session.  Reports a near miss, sat in a flower pot near front door yesterday.  Eval: Patient feels that he has been walking around like I'm drunk. He feels that it has been getting worse for the past 6-8 months. He did not have a mechanism of injury. He did have  Guillan-Barre in 2014 and he never got his legs back after that. He fell backwards yesterday, but he thinks his heel hit something causing him to fall. He is not hurting from this fall. He has been able to get up without help after each of his falls. He had tried going to the [facility], but this did not seem to help. Pt accompanied by: self  PERTINENT HISTORY: CHF, HTN, type 2 diabetes, history of Guillain-Barre, glaucoma, and history  of cancer    PAIN:  Are you having pain? No  PRECAUTIONS: Fall  RED FLAGS: None   WEIGHT BEARING RESTRICTIONS: No  FALLS: Has patient fallen in last 6 months? Yes. Number of falls 3-4  LIVING ENVIRONMENT: Lives with: lives alone Lives in: House/apartment Stairs: Yes: Internal: 25 steps; can reach both and External: 6 steps; can reach both; step to pattern and avoids going upstairs inside home Has following equipment at home: Single point cane  PLOF: Independent  PATIENT GOALS: improved balance  OBJECTIVE:  Note: Objective measures were completed at Evaluation unless otherwise noted.  COGNITION: Overall cognitive status: Within functional limits for tasks assessed   SENSATION: Light touch: Impaired  and diminished sensation to light touch in bilateral lower extremities Patient reports that he has neuropathy in both legs from his hips down to his feet in both legs  COORDINATION: WFL  EDEMA:  No edema observed   MUSCLE TONE: WFL for activities assessed  POSTURE: forward head and flexed trunk   LOWER EXTREMITY ROM: WFL for activities assessed  LOWER EXTREMITY MMT:    MMT Right Eval Left Eval  Hip flexion 4-/5 3+/5  Hip extension    Hip abduction    Hip adduction 4+/5 4+/5  Hip internal rotation    Hip external rotation    Knee flexion 4/5 4/5  Knee extension 4/5 3+/5  Ankle dorsiflexion 3/5 3/5  Ankle plantarflexion    Ankle inversion    Ankle  eversion    (Blank rows = not tested)  TRANSFERS: Sit to stand: unable to complete  without UE support from armrests  GAIT: Findings: Gait Characteristics: step through pattern, decreased stride length, decreased hip/knee flexion- Right, decreased hip/knee flexion- Left, Right foot flat, Left foot flat, poor foot clearance- Right, and poor foot clearance- Left, Assistive device utilized:Single point cane, Level of assistance: SBA, and Comments: patient utilized the [facility] in one hand and had the other hand on the wall for balance  FUNCTIONAL TESTS:  5 times sit to stand: 47.76 seconds; requires UE support from armrests  Timed up and go (TUG): 11/07/24: 41.28 seconds with cane 2 minute walk test: 12/2/2 MWT 159 ft with SPC DGI 11/23/24: 3/24 with SPC  PATIENT SURVEYS:  ABC scale: The Activities-Specific Balance Confidence (ABC) Scale 0% 10 20 30  40 50 60 70 80 90 100% No confidence<->completely confident  "How confident are you that you will not lose your balance or become unsteady when you . . .   Date tested 10/31/24  Walk around the house 100%  2. Walk up or down stairs 100%  3. Bend over and pick up a slipper from in front of a closet floor 0%  4. Reach for a small can off a shelf at eye level 100%  5. Stand on tip toes and reach for something above your head 50%  6. Stand on a chair and reach for something 0%  7. Sweep the floor 0%  8. Walk outside the house to a car parked in the driveway 100%  9. Get into or out of a car 100%  10. Walk across a parking lot to the mall 50%  11. Walk up or down a ramp 25%  12. Walk in a crowded mall where people rapidly walk past you 25%  13. Are bumped into by people as you walk through the mall 50%  14. Step onto or off of an escalator while you are holding onto the railing 100%  15. Step onto or off an escalator while holding onto parcels such that you cannot hold onto the railing 0%  16. Walk outside on icy sidewalks 0%  Total: #/16  50%                                                                                                                                   TREATMENT DATE:   11/23/24: Nustep, BUE and BLE, 5x seat 11 DGI 1. Gait level surface (0) Severe Impairment: Cannot walk 20' without assistance, severe gait deviations or imbalance. 2. Change in gait speed (1) Moderate Impairment: Makes only minor adjustments to walking speed, or accomplishes a change in speed with significant gait deviations, or changes speed but has significant gait deviations, or changes speed but loses balance but is able to recover and continue walking. 3. Gait with horizontal head turns (0) Severe Impairment: Performs task with severe disruption of gait,  i.e., staggers outside 15 path, loses balance, stops, reaches for wall. 4. Gait with vertical head turns 1) Moderate Impairment: Performs head turns with moderate change in gait velocity, slows down, staggers but recovers, can continue to walk. 5. Gait and pivot turn (0) Severe Impairment: Cannot turn safely, requires assistance to turn and stop. 6. Step over obstacle (0) Severe Impairment: Cannot perform without assistance. 7. Step around obstacles (1) Moderate Impairment: Is able to clear cones but must significantly slow, speed to accomplish task, or requires verbal cueing. 8. Stairs (1) Moderate Impairment: Two feet to a stair, must use rail.  TOTAL SCORE: 3 / 24 Sit to stand with foam in chair without UE assist x 5 (uses legs on back of chair to assist) 2# marching 2 x 10 2# sidestepping length of // bars x 2  11/21/24 2 MWT 159 ft with SPC Sit to stand with foam in chair without UE assist x 5 (uses legs on back of chair to assist) Trial of standing heel raises and toe raises with minimal available active AROM Squat x 10 in // bars Slant board 5 x 20 2# marching 2 x 10 2# sidestepping length of // bars x 2                                    11/07/24 EXERCISE LOG  Exercise Repetitions and Resistance Comments  Nustep  L3 x 7 minutes BUE and BLE   LAQ 3# x 2 x  10 reps Alternating LE   Seated march   3# x 2 x 10 reps  Alternating LE   Seated hip ADD isometric  2 minutes w/ 5 second hold    Rocker board  2.5 minutes  Seated   Seated HS curls  RTB x 20 reps each    TUG  41.28 seconds  With cane and CGA    Blank cell = exercise not performed today   10/31/24: PT evaluation, patient education, and HEP   PATIENT EDUCATION: Education details: muscle strengthening, POC, HEP, and expectation for soreness  Person educated: Patient Education method: Explanation, Demonstration, and Handouts Education comprehension: verbalized understanding and returned demonstration  HOME EXERCISE PROGRAM: Access Code: [email or web address] URL: [email or web address] Date: 10/31/2024 Prepared by: Lacinda Fass  Exercises - Seated March  - 1-2 x daily - 7 x weekly - 3 sets - 10 reps - Seated Long Arc Quad  - 1-2 x daily - 7 x weekly - 3 sets - 10 reps - Seated Heel Toe Raises  - 1-2 x daily - 7 x weekly - 3 sets - 10 reps  GOALS: Goals reviewed with patient? Yes  SHORT TERM GOALS: Target date: 11/21/24  Patient will be independent with his initial HEP. Baseline: Goal status: INITIAL  2.  Patient will be able to transfer from sitting to standing without upper extremity support for improved independence. Baseline:  Goal status: INITIAL  3.  Patient will improve his 5 times sit to stand time to 30 seconds or less for improved lower extremity power. Baseline:  Goal status: INITIAL  LONG TERM GOALS: Target date: 12/12/24  Patient will be independent with his advanced HEP. Baseline:  Goal status: INITIAL  2.  Patient will improve his 5 times sit to stand time to 20 seconds or less for improved functional mobility. Baseline:  Goal status: INITIAL  3.  Patient will improve his ABC scale by at least 15%  for improved perceived function with his daily activities. Baseline:  Goal status: INITIAL  4.  Patient will be able to navigate at least 4 steps while  utilizing only 1 railing for improved household mobility. Baseline:  Goal status: INITIAL  ASSESSMENT:  CLINICAL IMPRESSION: DGI complete with SPC and min to mod A required for safety during testing, score a 3/24 indicating significant impaired balance.  Pt with minimal changes in speed and needs to come to complete stop with head turns.  Gait with slow shuffled mechanics with inability to control with increased speed. Discussed pt may benefit from RW at all times, pt stated he uses walker at home at night.  Cueing required to improve mechanics with STS, required elevated height to stand without UE assistance due to LE weakness and relies on back of knees to keep stable upon standing.  No reports of pain through session, was limited by fatigue at appropriate levels, did required a couple of short duration seated rest breaks between activities.  Eval: Patient is a 89 y.o. male who was seen today for physical therapy evaluation and treatment for unsteadiness on his feet. He is a high fall risk as evidenced by his gait mechanics, objective and functional testing, and his history of falling. He also exhibited diminished sensation to light touch in both lower extremities. Recommend that he continue with skilled physical therapy to address his impairments to maximize his safety and functional mobility.    OBJECTIVE IMPAIRMENTS: Abnormal gait, decreased activity tolerance, decreased balance, decreased mobility, difficulty walking, and decreased strength.   ACTIVITY LIMITATIONS: standing, squatting, stairs, transfers, and locomotion level  PARTICIPATION LIMITATIONS: driving, shopping, community activity, and yard work  PERSONAL FACTORS: Age, Behavior pattern, Past/current experiences, Time since onset of injury/illness/exacerbation, and 3+ comorbidities: CHF, HTN, type 2 diabetes, history of Guillain-Barre, glaucoma, and history  of cancer   are also affecting patient's functional outcome.   REHAB  POTENTIAL: Fair    CLINICAL DECISION MAKING: Evolving/moderate complexity  EVALUATION COMPLEXITY: Moderate  PLAN:  PT FREQUENCY: 2x/week  PT DURATION: 6 weeks  PLANNED INTERVENTIONS: 97164- PT Re-evaluation, 97750- Physical Performance Testing, 97110-Therapeutic exercises, 97530- Therapeutic activity, 97112- Neuromuscular re-education, 97535- Self Care, [id number]- Manual therapy, [phone]- Gait training, Patient/Family education, Balance training, and Stair training  PLAN FOR NEXT SESSION: Encourage use of RW for daily activity, gait training, balance interventions, and lower extremity strengthening (utilize gait belt for standing interventions due to high fall risk) ankle mobility  Augustin Mclean, LPTA/CLT; CBIS [phone] 3:55 PM, 11/23/24

## 2024-11-28 ENCOUNTER — Encounter (HOSPITAL_COMMUNITY): Payer: Self-pay

## 2024-11-28 ENCOUNTER — Ambulatory Visit (HOSPITAL_COMMUNITY)

## 2024-11-28 DIAGNOSIS — Z9181 History of falling: Secondary | ICD-10-CM | POA: Diagnosis not present

## 2024-11-28 DIAGNOSIS — M6281 Muscle weakness (generalized): Secondary | ICD-10-CM

## 2024-11-28 NOTE — Therapy (Signed)
 OUTPATIENT PHYSICAL THERAPY NEURO TREATMENT   Patient Name: [name] MRN: [id number] DOB:07-28-1929, 89 y.o., male Today's Date: 11/28/2024   PCP: Sheryle Carwin, MD  REFERRING PROVIDER: Sheryle Carwin, MD   END OF SESSION:  PT End of Session - 11/28/24 1111     Visit Number 5    Number of Visits 12    Date for Recertification  01/19/25    Authorization Type Medicare part A & B with [employer] secondary    Authorization Time Period no auth required    Progress Note Due on Visit 10    PT Start Time 1111    PT Stop Time 1150    PT Time Calculation (min) 39 min    Activity Tolerance Patient tolerated treatment well    Behavior During Therapy [facility] for tasks assessed/performed            Past Medical History:  Diagnosis Date   Aortic stenosis    Status post [facility]AVR at [facility], November 2019   CAD (coronary artery disease)    DES to ostial RCA October 2019 at [facility]   Essential hypertension    Glaucoma    Guillain-Barre syndrome 06/2013   Treated at [facility]   History of nephrolithiasis    History of pneumonia    Hyperlipidemia    Patent ductus arteriosus    Surgically repaired at age 67   Prostate cancer (HCC)    Type 2 diabetes mellitus (HCC)    Past Surgical History:  Procedure Laterality Date   CARPAL TUNNEL RELEASE  2012   left   CARPAL TUNNEL RELEASE Right 06/12/2014   Procedure: RIGHT CARPAL TUNNEL RELEASE ;  Surgeon: Arley JONELLE Curia, MD;  Location: [facility];  Service: Orthopedics;  Laterality: Right;   CATARACT EXTRACTION W/PHACO Left 10/13/2016   Procedure: CATARACT EXTRACTION PHACO AND INTRAOCULAR LENS PLACEMENT LEFT EYE CDE=15.04;  Surgeon: Oneil Platts, MD;  Location: [facility];  Service: Ophthalmology;  Laterality: Left;  left   CATARACT EXTRACTION W/PHACO Right 11/03/2016   Procedure: CATARACT EXTRACTION PHACO AND INTRAOCULAR LENS PLACEMENT (IOC);  Surgeon: Oneil Platts, MD;  Location: [facility];  Service: Ophthalmology;  Laterality: Right;  CDE: 15.10    COLONOSCOPY     KNEE ARTHROSCOPY  1962   left   Patent ductus arteriosus repair     1951 - age 69   ULNAR NERVE TRANSPOSITION Right 06/12/2014   Procedure: DECOMPRESSION ULNAR NERVE RIGHT ELBOW;  Surgeon: Arley JONELLE Curia, MD;  Location: [facility];  Service: Orthopedics;  Laterality: Right;   VIDEO ASSISTED THORACOSCOPY (VATS)/THOROCOTOMY  8/14   left-guillian-barre   Patient Active Problem List   Diagnosis Date Noted   Coronary artery disease due to lipid rich plaque 12/22/2022   S/P TAVR (transcatheter aortic valve replacement) 11/02/2018   PAD (peripheral artery disease) 11/01/2018   CHF (congestive heart failure), NYHA class II, chronic, diastolic (HCC) 10/31/2018   Diabetes mellitus type 2, uncomplicated (HCC) 04/06/2017   Primary open angle glaucoma of both eyes, moderate stage 04/06/2017   Pseudophakia 04/06/2017   Lower extremity weakness 10/23/2013   Difficulty walking 10/23/2013   Guillain-Barre 07/13/2013   DVT of lower extremity (deep venous thrombosis) (HCC) 07/07/2013   Dyspnea 07/04/2013   Hyponatremia 07/04/2013   Diabetes (HCC) 07/03/2013   Hypertension 07/03/2013   Hyperlipidemia 07/03/2013   Lung mass 07/03/2013   Rash 07/03/2013    ONSET DATE: 6-8 months ago  REFERRING DIAG: gait disturbance   THERAPY DIAG:  History  of falling  Muscle weakness (generalized)  Rationale for Evaluation and Treatment: Rehabilitation  SUBJECTIVE:                                                                                                                                                                                             SUBJECTIVE STATEMENT: Pt states he has been going to the [employer] 3 days a week for about 2 months. Pt states he does the nustep machine and a lat pull down and back machine at the [facility].   Eval: Patient feels that he has been walking around like I'm drunk. He feels that it has been getting worse for the past 6-8 months. He did not have  a mechanism of injury. He did have Guillan-Barre in 2014 and he never got his legs back after that. He fell backwards yesterday, but he thinks his heel hit something causing him to fall. He is not hurting from this fall. He has been able to get up without help after each of his falls. He had tried going to the [facility], but this did not seem to help. Pt accompanied by: self  PERTINENT HISTORY: CHF, HTN, type 2 diabetes, history of Guillain-Barre, glaucoma, and history  of cancer    PAIN:  Are you having pain? No  PRECAUTIONS: Fall  RED FLAGS: None   WEIGHT BEARING RESTRICTIONS: No  FALLS: Has patient fallen in last 6 months? Yes. Number of falls 3-4  LIVING ENVIRONMENT: Lives with: lives alone Lives in: House/apartment Stairs: Yes: Internal: 25 steps; can reach both and External: 6 steps; can reach both; step to pattern and avoids going upstairs inside home Has following equipment at home: Single point cane  PLOF: Independent  PATIENT GOALS: improved balance  OBJECTIVE:  Note: Objective measures were completed at Evaluation unless otherwise noted.  COGNITION: Overall cognitive status: Within functional limits for tasks assessed   SENSATION: Light touch: Impaired  and diminished sensation to light touch in bilateral lower extremities Patient reports that he has neuropathy in both legs from his hips down to his feet in both legs  COORDINATION: WFL  EDEMA:  No edema observed   MUSCLE TONE: WFL for activities assessed  POSTURE: forward head and flexed trunk   LOWER EXTREMITY ROM: WFL for activities assessed  LOWER EXTREMITY MMT:    MMT Right Eval Left Eval  Hip flexion 4-/5 3+/5  Hip extension    Hip abduction    Hip adduction 4+/5 4+/5  Hip internal rotation    Hip external rotation    Knee flexion 4/5 4/5  Knee extension 4/5 3+/5  Ankle dorsiflexion 3/5  3/5  Ankle plantarflexion    Ankle inversion    Ankle eversion    (Blank rows = not  tested)  TRANSFERS: Sit to stand: unable to complete without UE support from armrests  GAIT: Findings: Gait Characteristics: step through pattern, decreased stride length, decreased hip/knee flexion- Right, decreased hip/knee flexion- Left, Right foot flat, Left foot flat, poor foot clearance- Right, and poor foot clearance- Left, Assistive device utilized:Single point cane, Level of assistance: SBA, and Comments: patient utilized the [facility] in one hand and had the other hand on the wall for balance  FUNCTIONAL TESTS:  5 times sit to stand: 47.76 seconds; requires UE support from armrests  Timed up and go (TUG): 11/07/24: 41.28 seconds with cane 2 minute walk test: 12/2/2 MWT 159 ft with SPC DGI 11/23/24: 3/24 with SPC  PATIENT SURVEYS:  ABC scale: The Activities-Specific Balance Confidence (ABC) Scale 0% 10 20 30  40 50 60 70 80 90 100% No confidence<->completely confident  "How confident are you that you will not lose your balance or become unsteady when you . . .   Date tested 10/31/24  Walk around the house 100%  2. Walk up or down stairs 100%  3. Bend over and pick up a slipper from in front of a closet floor 0%  4. Reach for a small can off a shelf at eye level 100%  5. Stand on tip toes and reach for something above your head 50%  6. Stand on a chair and reach for something 0%  7. Sweep the floor 0%  8. Walk outside the house to a car parked in the driveway 100%  9. Get into or out of a car 100%  10. Walk across a parking lot to the mall 50%  11. Walk up or down a ramp 25%  12. Walk in a crowded mall where people rapidly walk past you 25%  13. Are bumped into by people as you walk through the mall 50%  14. Step onto or off of an escalator while you are holding onto the railing 100%  15. Step onto or off an escalator while holding onto parcels such that you cannot hold onto the railing 0%  16. Walk outside on icy sidewalks 0%  Total: #/16  50%                                                                                                                                   TREATMENT DATE:   11/28/2024  Therapeutic Exercise: -Nustep, 5 minutes, level 5>4 resistance, pt cued for 80 spm -Hamstring curl machine, 2 sets of 10 reps, plate 4>5, pt cued for eccentric control -Leg press, 2 sets of 10 reps, plate 6 (3 reps on second set, left knee pain) Therapeutic Activity: -Standing normal BOS, 5 sets of trampoline toss attempts, 1-4 successful throws, pt requires mod assist for balance for BUE throw -Aeromat walks, in parallel bars, 1  lap of lateral and tandem steps, requires BUE support for safety -Sit to stands with kettle bell, 2 sets of 3 reps, pt cued for core activation, nose over toes and keeping weight close to chest   11/23/24: Nustep, BUE and BLE, 5x seat 11 DGI 1. Gait level surface (0) Severe Impairment: Cannot walk 20' without assistance, severe gait deviations or imbalance. 2. Change in gait speed (1) Moderate Impairment: Makes only minor adjustments to walking speed, or accomplishes a change in speed with significant gait deviations, or changes speed but has significant gait deviations, or changes speed but loses balance but is able to recover and continue walking. 3. Gait with horizontal head turns (0) Severe Impairment: Performs task with severe disruption of gait, i.e., staggers outside 15 path, loses balance, stops, reaches for wall. 4. Gait with vertical head turns 1) Moderate Impairment: Performs head turns with moderate change in gait velocity, slows down, staggers but recovers, can continue to walk. 5. Gait and pivot turn (0) Severe Impairment: Cannot turn safely, requires assistance to turn and stop. 6. Step over obstacle (0) Severe Impairment: Cannot perform without assistance. 7. Step around obstacles (1) Moderate Impairment: Is able to clear cones but must significantly slow, speed to accomplish task, or requires verbal cueing. 8.  Stairs (1) Moderate Impairment: Two feet to a stair, must use rail.  TOTAL SCORE: 3 / 24 Sit to stand with foam in chair without UE assist x 5 (uses legs on back of chair to assist) 2# marching 2 x 10 2# sidestepping length of // bars x 2  11/21/24 2 MWT 159 ft with SPC Sit to stand with foam in chair without UE assist x 5 (uses legs on back of chair to assist) Trial of standing heel raises and toe raises with minimal available active AROM Squat x 10 in // bars Slant board 5 x 20 2# marching 2 x 10 2# sidestepping length of // bars x 2                                     PATIENT EDUCATION: Education details: muscle strengthening, POC, HEP, and expectation for soreness  Person educated: Patient Education method: Explanation, Demonstration, and Handouts Education comprehension: verbalized understanding and returned demonstration  HOME EXERCISE PROGRAM: Access Code: [email or web address] URL: [email or web address] Date: 10/31/2024 Prepared by: Lacinda Fass  Exercises - Seated March  - 1-2 x daily - 7 x weekly - 3 sets - 10 reps - Seated Long Arc Quad  - 1-2 x daily - 7 x weekly - 3 sets - 10 reps - Seated Heel Toe Raises  - 1-2 x daily - 7 x weekly - 3 sets - 10 reps  STS 3x10  added verbally 11/28/24 JAM GOALS: Goals reviewed with patient? Yes  SHORT TERM GOALS: Target date: 11/21/24  Patient will be independent with his initial HEP. Baseline: Goal status: INITIAL  2.  Patient will be able to transfer from sitting to standing without upper extremity support for improved independence. Baseline:  Goal status: INITIAL  3.  Patient will improve his 5 times sit to stand time to 30 seconds or less for improved lower extremity power. Baseline:  Goal status: INITIAL  LONG TERM GOALS: Target date: 12/12/24  Patient will be independent with his advanced HEP. Baseline:  Goal status: INITIAL  2.  Patient will improve his 5 times sit to stand time to 20 seconds or  less  for improved functional mobility. Baseline:  Goal status: INITIAL  3.  Patient will improve his ABC scale by at least 15% for improved perceived function with his daily activities. Baseline:  Goal status: INITIAL  4.  Patient will be able to navigate at least 4 steps while utilizing only 1 railing for improved household mobility. Baseline:  Goal status: INITIAL  ASSESSMENT:  CLINICAL IMPRESSION: Patient continues to demonstrate decreased LE strength/ROM, decreased gait quality and balance. Patient also demonstrates decreased endurance with aerobic based exercise during today's session with increased resistance on nustep. Patient able to progress dynamic balance and core activation exercises today with trampoline toss and aeromat walks, good performance with verbal cueing. Patient would continue to benefit from skilled physical therapy for increased endurance with ambulation, increased LE strength, and improved balance for improved quality of life, improved independence with gait training and continued progress towards therapy goals.   Eval: Patient is a 89 y.o. male who was seen today for physical therapy evaluation and treatment for unsteadiness on his feet. He is a high fall risk as evidenced by his gait mechanics, objective and functional testing, and his history of falling. He also exhibited diminished sensation to light touch in both lower extremities. Recommend that he continue with skilled physical therapy to address his impairments to maximize his safety and functional mobility.    OBJECTIVE IMPAIRMENTS: Abnormal gait, decreased activity tolerance, decreased balance, decreased mobility, difficulty walking, and decreased strength.   ACTIVITY LIMITATIONS: standing, squatting, stairs, transfers, and locomotion level  PARTICIPATION LIMITATIONS: driving, shopping, community activity, and yard work  PERSONAL FACTORS: Age, Behavior pattern, Past/current experiences, Time since onset of  injury/illness/exacerbation, and 3+ comorbidities: CHF, HTN, type 2 diabetes, history of Guillain-Barre, glaucoma, and history  of cancer   are also affecting patient's functional outcome.   REHAB POTENTIAL: Fair    CLINICAL DECISION MAKING: Evolving/moderate complexity  EVALUATION COMPLEXITY: Moderate  PLAN:  PT FREQUENCY: 2x/week  PT DURATION: 6 weeks  PLANNED INTERVENTIONS: 97164- PT Re-evaluation, 97750- Physical Performance Testing, 97110-Therapeutic exercises, 97530- Therapeutic activity, 97112- Neuromuscular re-education, 97535- Self Care, [id number]- Manual therapy, [phone]- Gait training, Patient/Family education, Balance training, and Stair training  PLAN FOR NEXT SESSION: Encourage use of RW for daily activity, gait training, balance interventions, and lower extremity strengthening (utilize gait belt for standing interventions due to high fall risk) ankle mobility  Lang Ada, PT, DPT [facility] Office: [phone] 12:03 PM, 11/28/24

## 2024-11-30 ENCOUNTER — Encounter (HOSPITAL_COMMUNITY): Payer: Self-pay

## 2024-11-30 ENCOUNTER — Ambulatory Visit (HOSPITAL_COMMUNITY)

## 2024-11-30 DIAGNOSIS — Z9181 History of falling: Secondary | ICD-10-CM

## 2024-11-30 DIAGNOSIS — M6281 Muscle weakness (generalized): Secondary | ICD-10-CM

## 2024-11-30 NOTE — Therapy (Signed)
 OUTPATIENT PHYSICAL THERAPY NEURO TREATMENT   Patient Name: [name] MRN: [id number] DOB:02/21/1929, 89 y.o., male Today's Date: 11/30/2024   PCP: Sheryle Carwin, MD  REFERRING PROVIDER: Sheryle Carwin, MD   END OF SESSION:  PT End of Session - 11/30/24 0731     Visit Number 6    Number of Visits 12    Date for Recertification  01/19/25    Authorization Type Medicare part A & B with [employer] secondary    Authorization Time Period no auth required    Progress Note Due on Visit 10    PT Start Time 0732    PT Stop Time 0814    PT Time Calculation (min) 42 min    Equipment Utilized During Treatment Gait belt    Activity Tolerance Patient tolerated treatment well    Behavior During Therapy [facility] for tasks assessed/performed            Past Medical History:  Diagnosis Date   Aortic stenosis    Status post TAVR at [facility], November 2019   CAD (coronary artery disease)    DES to ostial RCA October 2019 at [facility]   Essential hypertension    Glaucoma    Guillain-Barre syndrome 06/2013   Treated at [facility]   History of nephrolithiasis    History of pneumonia    Hyperlipidemia    Patent ductus arteriosus    Surgically repaired at age 74   Prostate cancer (HCC)    Type 2 diabetes mellitus (HCC)    Past Surgical History:  Procedure Laterality Date   CARPAL TUNNEL RELEASE  2012   left   CARPAL TUNNEL RELEASE Right 06/12/2014   Procedure: RIGHT CARPAL TUNNEL RELEASE ;  Surgeon: Arley JONELLE Curia, MD;  Location: [facility];  Service: Orthopedics;  Laterality: Right;   CATARACT EXTRACTION W/PHACO Left 10/13/2016   Procedure: CATARACT EXTRACTION PHACO AND INTRAOCULAR LENS PLACEMENT LEFT EYE CDE=15.04;  Surgeon: Oneil Platts, MD;  Location: [facility];  Service: Ophthalmology;  Laterality: Left;  left   CATARACT EXTRACTION W/PHACO Right 11/03/2016   Procedure: CATARACT EXTRACTION PHACO AND INTRAOCULAR LENS PLACEMENT (IOC);  Surgeon: Oneil Platts, MD;  Location: [facility];  Service:  Ophthalmology;  Laterality: Right;  CDE: 15.10   COLONOSCOPY     KNEE ARTHROSCOPY  1962   left   Patent ductus arteriosus repair     1951 - age 89   ULNAR NERVE TRANSPOSITION Right 06/12/2014   Procedure: DECOMPRESSION ULNAR NERVE RIGHT ELBOW;  Surgeon: Arley JONELLE Curia, MD;  Location:  SURGERY CENTER;  Service: Orthopedics;  Laterality: Right;   VIDEO ASSISTED THORACOSCOPY (VATS)/THOROCOTOMY  8/14   left-guillian-barre   Patient Active Problem List   Diagnosis Date Noted   Coronary artery disease due to lipid rich plaque 12/22/2022   S/P TAVR (transcatheter aortic valve replacement) 11/02/2018   PAD (peripheral artery disease) 11/01/2018   CHF (congestive heart failure), NYHA class II, chronic, diastolic (HCC) 10/31/2018   Diabetes mellitus type 2, uncomplicated (HCC) 04/06/2017   Primary open angle glaucoma of both eyes, moderate stage 04/06/2017   Pseudophakia 04/06/2017   Lower extremity weakness 10/23/2013   Difficulty walking 10/23/2013   Guillain-Barre 07/13/2013   DVT of lower extremity (deep venous thrombosis) (HCC) 07/07/2013   Dyspnea 07/04/2013   Hyponatremia 07/04/2013   Diabetes (HCC) 07/03/2013   Hypertension 07/03/2013   Hyperlipidemia 07/03/2013   Lung mass 07/03/2013   Rash 07/03/2013    ONSET DATE: 6-8 months ago  REFERRING  DIAG: gait disturbance   THERAPY DIAG:  History of falling  Muscle weakness (generalized)  Rationale for Evaluation and Treatment: Rehabilitation  SUBJECTIVE:                                                                                                                                                                                             SUBJECTIVE STATEMENT: Pt stated he continues to feel unstable while walking.  Arrived with  [facility] today, stated he uses RW at home at night.  No reports of recent fall.  Eval: Patient feels that he has been walking around like I'm drunk. He feels that it has been getting worse for the  past 6-8 months. He did not have a mechanism of injury. He did have Guillan-Barre in 2014 and he never got his legs back after that. He fell backwards yesterday, but he thinks his heel hit something causing him to fall. He is not hurting from this fall. He has been able to get up without help after each of his falls. He had tried going to the [facility], but this did not seem to help. Pt accompanied by: self  PERTINENT HISTORY: CHF, HTN, type 2 diabetes, history of Guillain-Barre, glaucoma, and history  of cancer    PAIN:  Are you having pain? No  PRECAUTIONS: Fall  RED FLAGS: None   WEIGHT BEARING RESTRICTIONS: No  FALLS: Has patient fallen in last 6 months? Yes. Number of falls 3-4  LIVING ENVIRONMENT: Lives with: lives alone Lives in: House/apartment Stairs: Yes: Internal: 25 steps; can reach both and External: 6 steps; can reach both; step to pattern and avoids going upstairs inside home Has following equipment at home: Single point cane  PLOF: Independent  PATIENT GOALS: improved balance  OBJECTIVE:  Note: Objective measures were completed at Evaluation unless otherwise noted.  COGNITION: Overall cognitive status: Within functional limits for tasks assessed   SENSATION: Light touch: Impaired  and diminished sensation to light touch in bilateral lower extremities Patient reports that he has neuropathy in both legs from his hips down to his feet in both legs  COORDINATION: WFL  EDEMA:  No edema observed   MUSCLE TONE: WFL for activities assessed  POSTURE: forward head and flexed trunk   LOWER EXTREMITY ROM: WFL for activities assessed  LOWER EXTREMITY MMT:    MMT Right Eval Left Eval  Hip flexion 4-/5 3+/5  Hip extension    Hip abduction    Hip adduction 4+/5 4+/5  Hip internal rotation    Hip external rotation    Knee flexion 4/5 4/5  Knee extension 4/5 3+/5  Ankle  dorsiflexion 3/5 3/5  Ankle plantarflexion    Ankle inversion    Ankle eversion    (Blank  rows = not tested)  TRANSFERS: Sit to stand: unable to complete without UE support from armrests  GAIT: Findings: Gait Characteristics: step through pattern, decreased stride length, decreased hip/knee flexion- Right, decreased hip/knee flexion- Left, Right foot flat, Left foot flat, poor foot clearance- Right, and poor foot clearance- Left, Assistive device utilized:Single point cane, Level of assistance: SBA, and Comments: patient utilized the [facility] in one hand and had the other hand on the wall for balance  FUNCTIONAL TESTS:  5 times sit to stand: 47.76 seconds; requires UE support from armrests  Timed up and go (TUG): 11/07/24: 41.28 seconds with cane 2 minute walk test: 12/2/2 MWT 159 ft with SPC DGI 11/23/24: 3/24 with SPC  PATIENT SURVEYS:  ABC scale: The Activities-Specific Balance Confidence (ABC) Scale 0% 10 20 30  40 50 60 70 80 90 100% No confidence<->completely confident  How confident are you that you will not lose your balance or become unsteady when you . . .   Date tested 10/31/24  Walk around the house 100%  2. Walk up or down stairs 100%  3. Bend over and pick up a slipper from in front of a closet floor 0%  4. Reach for a small can off a shelf at eye level 100%  5. Stand on tip toes and reach for something above your head 50%  6. Stand on a chair and reach for something 0%  7. Sweep the floor 0%  8. Walk outside the house to a car parked in the driveway 100%  9. Get into or out of a car 100%  10. Walk across a parking lot to the mall 50%  11. Walk up or down a ramp 25%  12. Walk in a crowded mall where people rapidly walk past you 25%  13. Are bumped into by people as you walk through the mall 50%  14. Step onto or off of an escalator while you are holding onto the railing 100%  15. Step onto or off an escalator while holding onto parcels such that you cannot hold onto the railing 0%  16. Walk outside on icy sidewalks 0%  Total: #/16  50%                                                                                                                                   TREATMENT DATE:   11/30/24: - Nustep, 5 minutes, level 4 resistance, UE/LE - Rebounder red weight ball paired with STS able to throw/catch 10/10 2x5 reps - Toe tapping with 3# ankle 1 HHA 2x 10 reps on 6in step - Step up and over 6in step 2 HHA 6 reps each LE - Sidestep 3# on ankle, RTB around thighs, 4RT inside // bars  11/28/2024  Therapeutic Exercise: -Nustep, 5 minutes, level 5>4 resistance, pt  cued for 80 spm -Hamstring curl machine, 2 sets of 10 reps, plate 4>5, pt cued for eccentric control -Leg press, 2 sets of 10 reps, plate 6 (3 reps on second set, left knee pain) Therapeutic Activity: -Standing normal BOS, 5 sets of trampoline toss attempts, 1-4 successful throws, pt requires mod assist for balance for BUE throw -Aeromat walks, in parallel bars, 1 lap of lateral and tandem steps, requires BUE support for safety -Sit to stands with kettle bell, 2 sets of 3 reps, pt cued for core activation, nose over toes and keeping weight close to chest   11/23/24: Nustep, BUE and BLE, 5x seat 11 DGI 1. Gait level surface (0) Severe Impairment: Cannot walk 20' without assistance, severe gait deviations or imbalance. 2. Change in gait speed (1) Moderate Impairment: Makes only minor adjustments to walking speed, or accomplishes a change in speed with significant gait deviations, or changes speed but has significant gait deviations, or changes speed but loses balance but is able to recover and continue walking. 3. Gait with horizontal head turns (0) Severe Impairment: Performs task with severe disruption of gait, i.e., staggers outside 15 path, loses balance, stops, reaches for wall. 4. Gait with vertical head turns 1) Moderate Impairment: Performs head turns with moderate change in gait velocity, slows down, staggers but recovers, can continue to walk. 5. Gait and pivot turn (0) Severe  Impairment: Cannot turn safely, requires assistance to turn and stop. 6. Step over obstacle (0) Severe Impairment: Cannot perform without assistance. 7. Step around obstacles (1) Moderate Impairment: Is able to clear cones but must significantly slow, speed to accomplish task, or requires verbal cueing. 8. Stairs (1) Moderate Impairment: Two feet to a stair, must use rail.  TOTAL SCORE: 3 / 24 Sit to stand with foam in chair without UE assist x 5 (uses legs on back of chair to assist) 2# marching 2 x 10 2# sidestepping length of // bars x 2  11/21/24 2 MWT 159 ft with SPC Sit to stand with foam in chair without UE assist x 5 (uses legs on back of chair to assist) Trial of standing heel raises and toe raises with minimal available active AROM Squat x 10 in // bars Slant board 5 x 20 2# marching 2 x 10 2# sidestepping length of // bars x 2                                     PATIENT EDUCATION: Education details: muscle strengthening, POC, HEP, and expectation for soreness  Person educated: Patient Education method: Explanation, Demonstration, and Handouts Education comprehension: verbalized understanding and returned demonstration  HOME EXERCISE PROGRAM: Access Code: [email or web address] URL: [email or web address] Date: 10/31/2024 Prepared by: Lacinda Fass  Exercises - Seated March  - 1-2 x daily - 7 x weekly - 3 sets - 10 reps - Seated Long Arc Quad  - 1-2 x daily - 7 x weekly - 3 sets - 10 reps - Seated Heel Toe Raises  - 1-2 x daily - 7 x weekly - 3 sets - 10 reps  STS 3x10  added verbally 11/28/24 JAM GOALS: Goals reviewed with patient? Yes  SHORT TERM GOALS: Target date: 11/21/24  Patient will be independent with his initial HEP. Baseline: Goal status: INITIAL  2.  Patient will be able to transfer from sitting to standing without upper extremity support for improved independence. Baseline:  Goal status: INITIAL  3.  Patient will improve his 5 times sit to  stand time to 30 seconds or less for improved lower extremity power. Baseline:  Goal status: INITIAL  LONG TERM GOALS: Target date: 12/12/24  Patient will be independent with his advanced HEP. Baseline:  Goal status: INITIAL  2.  Patient will improve his 5 times sit to stand time to 20 seconds or less for improved functional mobility. Baseline:  Goal status: INITIAL  3.  Patient will improve his ABC scale by at least 15% for improved perceived function with his daily activities. Baseline:  Goal status: INITIAL  4.  Patient will be able to navigate at least 4 steps while utilizing only 1 railing for improved household mobility. Baseline:  Goal status: INITIAL  ASSESSMENT:  CLINICAL IMPRESSION: Pt arrived ambulating with SPC and presents unstable gait due to LE weakness and impaired balance.  Pt educated on benefits of walking with RW all day to imporve balance during gait, stated he uses it at night and has increased difficulty with walker on uneven terrain.  Session focus with LE strengthening and balance training.  Added sit to stands paired with rebounding for increased gluteal activation and stability, pt able to catch all 10/10 throws, min A with STS and cueing for eccentric control.  Pt tolerated well to session with no reports of pain, was limited by appropriate levels of fatigue.    Eval: Patient is a 89 y.o. male who was seen today for physical therapy evaluation and treatment for unsteadiness on his feet. He is a high fall risk as evidenced by his gait mechanics, objective and functional testing, and his history of falling. He also exhibited diminished sensation to light touch in both lower extremities. Recommend that he continue with skilled physical therapy to address his impairments to maximize his safety and functional mobility.    OBJECTIVE IMPAIRMENTS: Abnormal gait, decreased activity tolerance, decreased balance, decreased mobility, difficulty walking, and decreased  strength.   ACTIVITY LIMITATIONS: standing, squatting, stairs, transfers, and locomotion level  PARTICIPATION LIMITATIONS: driving, shopping, community activity, and yard work  PERSONAL FACTORS: Age, Behavior pattern, Past/current experiences, Time since onset of injury/illness/exacerbation, and 3+ comorbidities: CHF, HTN, type 2 diabetes, history of Guillain-Barre, glaucoma, and history  of cancer   are also affecting patient's functional outcome.   REHAB POTENTIAL: Fair    CLINICAL DECISION MAKING: Evolving/moderate complexity  EVALUATION COMPLEXITY: Moderate  PLAN:  PT FREQUENCY: 2x/week  PT DURATION: 6 weeks  PLANNED INTERVENTIONS: 97164- PT Re-evaluation, 97750- Physical Performance Testing, 97110-Therapeutic exercises, 97530- Therapeutic activity, 97112- Neuromuscular re-education, 97535- Self Care, [id number]- Manual therapy, [phone]- Gait training, Patient/Family education, Balance training, and Stair training  PLAN FOR NEXT SESSION: Encourage use of RW for daily activity, gait training, balance interventions, and lower extremity strengthening (utilize gait belt for standing interventions due to high fall risk) ankle mobility.  Begin bridges and squats, add to HEP if good form and mechanics.  Augustin Mclean, LPTA/CLT; CBIS [phone]  9:44 AM, 11/30/2024

## 2024-12-05 ENCOUNTER — Ambulatory Visit (HOSPITAL_COMMUNITY)

## 2024-12-05 ENCOUNTER — Encounter (HOSPITAL_COMMUNITY): Payer: Self-pay

## 2024-12-05 DIAGNOSIS — Z9181 History of falling: Secondary | ICD-10-CM

## 2024-12-05 DIAGNOSIS — M6281 Muscle weakness (generalized): Secondary | ICD-10-CM

## 2024-12-05 NOTE — Therapy (Signed)
 OUTPATIENT PHYSICAL THERAPY NEURO TREATMENT   Patient Name: [name] MRN: [id number] DOB:12-10-1929, 89 y.o., male Today's Date: 12/05/2024   PCP: Sheryle Carwin, MD  REFERRING PROVIDER: Sheryle Carwin, MD   END OF SESSION:  PT End of Session - 12/05/24 0731     Visit Number 7    Number of Visits 12    Date for Recertification  01/19/25    Authorization Type Medicare part A & B with [employer] secondary    Authorization Time Period no auth required    Progress Note Due on Visit 10    PT Start Time 0731    PT Stop Time 0812    PT Time Calculation (min) 41 min    Equipment Utilized During Treatment Gait belt    Activity Tolerance Patient tolerated treatment well    Behavior During Therapy [facility] for tasks assessed/performed            Past Medical History:  Diagnosis Date   Aortic stenosis    Status post TAVR at [facility], November 2019   CAD (coronary artery disease)    DES to ostial RCA October 2019 at [facility]   Essential hypertension    Glaucoma    Guillain-Barre syndrome 06/2013   Treated at [facility]   History of nephrolithiasis    History of pneumonia    Hyperlipidemia    Patent ductus arteriosus    Surgically repaired at age 89   Prostate cancer (HCC)    Type 2 diabetes mellitus (HCC)    Past Surgical History:  Procedure Laterality Date   CARPAL TUNNEL RELEASE  2012   left   CARPAL TUNNEL RELEASE Right 06/12/2014   Procedure: RIGHT CARPAL TUNNEL RELEASE ;  Surgeon: Arley JONELLE Curia, MD;  Location: [facility];  Service: Orthopedics;  Laterality: Right;   CATARACT EXTRACTION W/PHACO Left 10/13/2016   Procedure: CATARACT EXTRACTION PHACO AND INTRAOCULAR LENS PLACEMENT LEFT EYE CDE=15.04;  Surgeon: Oneil Platts, MD;  Location: [facility];  Service: Ophthalmology;  Laterality: Left;  left   CATARACT EXTRACTION W/PHACO Right 11/03/2016   Procedure: CATARACT EXTRACTION PHACO AND INTRAOCULAR LENS PLACEMENT (IOC);  Surgeon: Oneil Platts, MD;  Location: [facility];  Service:  Ophthalmology;  Laterality: Right;  CDE: 15.10   COLONOSCOPY     KNEE ARTHROSCOPY  1962   left   Patent ductus arteriosus repair     1951 - age 78   ULNAR NERVE TRANSPOSITION Right 06/12/2014   Procedure: DECOMPRESSION ULNAR NERVE RIGHT ELBOW;  Surgeon: Arley JONELLE Curia, MD;  Location: [facility];  Service: Orthopedics;  Laterality: Right;   VIDEO ASSISTED THORACOSCOPY (VATS)/THOROCOTOMY  8/14   left-guillian-barre   Patient Active Problem List   Diagnosis Date Noted   Coronary artery disease due to lipid rich plaque 12/22/2022   S/P TAVR (transcatheter aortic valve replacement) 11/02/2018   PAD (peripheral artery disease) 11/01/2018   CHF (congestive heart failure), NYHA class II, chronic, diastolic (HCC) 10/31/2018   Diabetes mellitus type 2, uncomplicated (HCC) 04/06/2017   Primary open angle glaucoma of both eyes, moderate stage 04/06/2017   Pseudophakia 04/06/2017   Lower extremity weakness 10/23/2013   Difficulty walking 10/23/2013   Guillain-Barre 07/13/2013   DVT of lower extremity (deep venous thrombosis) (HCC) 07/07/2013   Dyspnea 07/04/2013   Hyponatremia 07/04/2013   Diabetes (HCC) 07/03/2013   Hypertension 07/03/2013   Hyperlipidemia 07/03/2013   Lung mass 07/03/2013   Rash 07/03/2013    ONSET DATE: 6-8 months ago  REFERRING  DIAG: gait disturbance   THERAPY DIAG:  History of falling  Muscle weakness (generalized)  Rationale for Evaluation and Treatment: Rehabilitation  SUBJECTIVE:                                                                                                                                                                                             SUBJECTIVE STATEMENT: Reports he went out for dinner the other night with daughter, stated he was walking better.  Arrived with [facility] today.  No reports of recent fall or pain.  Eval: Patient feels that he has been walking around like I'm drunk. He feels that it has been getting worse  for the past 6-8 months. He did not have a mechanism of injury. He did have Guillan-Barre in 2014 and he never got his legs back after that. He fell backwards yesterday, but he thinks his heel hit something causing him to fall. He is not hurting from this fall. He has been able to get up without help after each of his falls. He had tried going to the [facility], but this did not seem to help. Pt accompanied by: self  PERTINENT HISTORY: CHF, HTN, type 2 diabetes, history of Guillain-Barre, glaucoma, and history  of cancer    PAIN:  Are you having pain? No  PRECAUTIONS: Fall  RED FLAGS: None   WEIGHT BEARING RESTRICTIONS: No  FALLS: Has patient fallen in last 6 months? Yes. Number of falls 3-4  LIVING ENVIRONMENT: Lives with: lives alone Lives in: House/apartment Stairs: Yes: Internal: 25 steps; can reach both and External: 6 steps; can reach both; step to pattern and avoids going upstairs inside home Has following equipment at home: Single point cane  PLOF: Independent  PATIENT GOALS: improved balance  OBJECTIVE:  Note: Objective measures were completed at Evaluation unless otherwise noted.  COGNITION: Overall cognitive status: Within functional limits for tasks assessed   SENSATION: Light touch: Impaired  and diminished sensation to light touch in bilateral lower extremities Patient reports that he has neuropathy in both legs from his hips down to his feet in both legs  COORDINATION: WFL  EDEMA:  No edema observed   MUSCLE TONE: WFL for activities assessed  POSTURE: forward head and flexed trunk   LOWER EXTREMITY ROM: WFL for activities assessed  LOWER EXTREMITY MMT:    MMT Right Eval Left Eval  Hip flexion 4-/5 3+/5  Hip extension    Hip abduction    Hip adduction 4+/5 4+/5  Hip internal rotation    Hip external rotation    Knee flexion 4/5 4/5  Knee extension 4/5 3+/5  Ankle  dorsiflexion 3/5 3/5  Ankle plantarflexion    Ankle inversion    Ankle eversion     (Blank rows = not tested)  TRANSFERS: Sit to stand: unable to complete without UE support from armrests  GAIT: Findings: Gait Characteristics: step through pattern, decreased stride length, decreased hip/knee flexion- Right, decreased hip/knee flexion- Left, Right foot flat, Left foot flat, poor foot clearance- Right, and poor foot clearance- Left, Assistive device utilized:Single point cane, Level of assistance: SBA, and Comments: patient utilized the [facility] in one hand and had the other hand on the wall for balance  FUNCTIONAL TESTS:  5 times sit to stand: 47.76 seconds; requires UE support from armrests  Timed up and go (TUG): 11/07/24: 41.28 seconds with cane 2 minute walk test: 12/2/2 MWT 159 ft with SPC DGI 11/23/24: 3/24 with SPC  PATIENT SURVEYS:  ABC scale: The Activities-Specific Balance Confidence (ABC) Scale 0% 10 20 30  40 50 60 70 80 90 100% No confidence<->completely confident  How confident are you that you will not lose your balance or become unsteady when you . . .   Date tested 10/31/24  Walk around the house 100%  2. Walk up or down stairs 100%  3. Bend over and pick up a slipper from in front of a closet floor 0%  4. Reach for a small can off a shelf at eye level 100%  5. Stand on tip toes and reach for something above your head 50%  6. Stand on a chair and reach for something 0%  7. Sweep the floor 0%  8. Walk outside the house to a car parked in the driveway 100%  9. Get into or out of a car 100%  10. Walk across a parking lot to the mall 50%  11. Walk up or down a ramp 25%  12. Walk in a crowded mall where people rapidly walk past you 25%  13. Are bumped into by people as you walk through the mall 50%  14. Step onto or off of an escalator while you are holding onto the railing 100%  15. Step onto or off an escalator while holding onto parcels such that you cannot hold onto the railing 0%  16. Walk outside on icy sidewalks 0%  Total: #/16  50%                                                                                                                                   TREATMENT DATE:   12/05/24: - Nustep, 5 minutes, level 4 resistance, UE/LE average SPM 84 Supine: - Bridge 2x 10 5 holds with RTB around thigh Standing inside // bars 3# on ankles: - Marching 3RT - Hamstring curls 3RT - Sidestep with RTB around thighs 4RT  - Forward then lateral hurdles over 6 then 12in with HHA 2RT - Minisquats front of chair with cueing for mechanics 8 reps   11/30/24: - Nustep, 5  minutes, level 4 resistance, UE/LE - Rebounder red weight ball paired with STS able to throw/catch 10/10 2x5 reps - Toe tapping with 3# ankle 1 HHA 2x 10 reps on 6in step - Step up and over 6in step 2 HHA 6 reps each LE - Sidestep 3# on ankle, RTB around thighs, 4RT inside // bars  11/28/2024  Therapeutic Exercise: -Nustep, 5 minutes, level 5>4 resistance, pt cued for 80 spm -Hamstring curl machine, 2 sets of 10 reps, plate 4>5, pt cued for eccentric control -Leg press, 2 sets of 10 reps, plate 6 (3 reps on second set, left knee pain) Therapeutic Activity: -Standing normal BOS, 5 sets of trampoline toss attempts, 1-4 successful throws, pt requires mod assist for balance for BUE throw -Aeromat walks, in parallel bars, 1 lap of lateral and tandem steps, requires BUE support for safety -Sit to stands with kettle bell, 2 sets of 3 reps, pt cued for core activation, nose over toes and keeping weight close to chest   11/23/24: Nustep, BUE and BLE, 5x seat 11 DGI 1. Gait level surface (0) Severe Impairment: Cannot walk 20' without assistance, severe gait deviations or imbalance. 2. Change in gait speed (1) Moderate Impairment: Makes only minor adjustments to walking speed, or accomplishes a change in speed with significant gait deviations, or changes speed but has significant gait deviations, or changes speed but loses balance but is able to recover and continue walking. 3.  Gait with horizontal head turns (0) Severe Impairment: Performs task with severe disruption of gait, i.e., staggers outside 15 path, loses balance, stops, reaches for wall. 4. Gait with vertical head turns 1) Moderate Impairment: Performs head turns with moderate change in gait velocity, slows down, staggers but recovers, can continue to walk. 5. Gait and pivot turn (0) Severe Impairment: Cannot turn safely, requires assistance to turn and stop. 6. Step over obstacle (0) Severe Impairment: Cannot perform without assistance. 7. Step around obstacles (1) Moderate Impairment: Is able to clear cones but must significantly slow, speed to accomplish task, or requires verbal cueing. 8. Stairs (1) Moderate Impairment: Two feet to a stair, must use rail.  TOTAL SCORE: 3 / 24 Sit to stand with foam in chair without UE assist x 5 (uses legs on back of chair to assist) 2# marching 2 x 10 2# sidestepping length of // bars x 2  11/21/24 2 MWT 159 ft with SPC Sit to stand with foam in chair without UE assist x 5 (uses legs on back of chair to assist) Trial of standing heel raises and toe raises with minimal available active AROM Squat x 10 in // bars Slant board 5 x 20 2# marching 2 x 10 2# sidestepping length of // bars x 2                                     PATIENT EDUCATION: Education details: muscle strengthening, POC, HEP, and expectation for soreness  Person educated: Patient Education method: Explanation, Demonstration, and Handouts Education comprehension: verbalized understanding and returned demonstration  HOME EXERCISE PROGRAM: Access Code: [email or web address] URL: [email or web address] Date: 10/31/2024 Prepared by: Lacinda Fass  Exercises - Seated March  - 1-2 x daily - 7 x weekly - 3 sets - 10 reps - Seated Long Arc Quad  - 1-2 x daily - 7 x weekly - 3 sets - 10 reps - Seated Heel Toe Raises  - 1-2 x daily -  7 x weekly - 3 sets - 10 reps  STS 3x10  added verbally  11/28/24 JAM   12/05/24: - Supine Bridge  - 1 x daily - 7 x weekly - 3 sets - 10 reps - 5 hold - Sit to Stand with Armchair  - 2 x daily - 7 x weekly - 1 sets - 10 reps GOALS: Goals reviewed with patient? Yes  SHORT TERM GOALS: Target date: 11/21/24  Patient will be independent with his initial HEP. Baseline: Goal status: INITIAL  2.  Patient will be able to transfer from sitting to standing without upper extremity support for improved independence. Baseline:  Goal status: INITIAL  3.  Patient will improve his 5 times sit to stand time to 30 seconds or less for improved lower extremity power. Baseline:  Goal status: INITIAL  LONG TERM GOALS: Target date: 12/12/24  Patient will be independent with his advanced HEP. Baseline:  Goal status: INITIAL  2.  Patient will improve his 5 times sit to stand time to 20 seconds or less for improved functional mobility. Baseline:  Goal status: INITIAL  3.  Patient will improve his ABC scale by at least 15% for improved perceived function with his daily activities. Baseline:  Goal status: INITIAL  4.  Patient will be able to navigate at least 4 steps while utilizing only 1 railing for improved household mobility. Baseline:  Goal status: INITIAL  ASSESSMENT:  CLINICAL IMPRESSION: Session focus with LE strengthening and balance training.  Added bridges and mini-squats for gluteal strengthening.  Pt continues to demonstrate instability when standing without HHA.  Encouraged to ambulate with RW for stability though continues with SPC and no reports of recent fall, SBA through session for safety.  Added bridges to HEP with printout given.    Eval: Patient is a 89 y.o. male who was seen today for physical therapy evaluation and treatment for unsteadiness on his feet. He is a high fall risk as evidenced by his gait mechanics, objective and functional testing, and his history of falling. He also exhibited diminished sensation to light touch in  both lower extremities. Recommend that he continue with skilled physical therapy to address his impairments to maximize his safety and functional mobility.    OBJECTIVE IMPAIRMENTS: Abnormal gait, decreased activity tolerance, decreased balance, decreased mobility, difficulty walking, and decreased strength.   ACTIVITY LIMITATIONS: standing, squatting, stairs, transfers, and locomotion level  PARTICIPATION LIMITATIONS: driving, shopping, community activity, and yard work  PERSONAL FACTORS: Age, Behavior pattern, Past/current experiences, Time since onset of injury/illness/exacerbation, and 3+ comorbidities: CHF, HTN, type 2 diabetes, history of Guillain-Barre, glaucoma, and history  of cancer   are also affecting patient's functional outcome.   REHAB POTENTIAL: Fair    CLINICAL DECISION MAKING: Evolving/moderate complexity  EVALUATION COMPLEXITY: Moderate  PLAN:  PT FREQUENCY: 2x/week  PT DURATION: 6 weeks  PLANNED INTERVENTIONS: 97164- PT Re-evaluation, 97750- Physical Performance Testing, 97110-Therapeutic exercises, 97530- Therapeutic activity, 97112- Neuromuscular re-education, 97535- Self Care, [id number]- Manual therapy, [phone]- Gait training, Patient/Family education, Balance training, and Stair training  PLAN FOR NEXT SESSION: Encourage use of RW for daily activity, gait training, balance interventions, and lower extremity strengthening (utilize gait belt for standing interventions due to high fall risk) ankle mobility.    Augustin Mclean, LPTA/CLT; CBIS [phone]  8:35 AM, 12/05/2024

## 2024-12-07 ENCOUNTER — Encounter (HOSPITAL_COMMUNITY): Payer: Self-pay

## 2024-12-07 ENCOUNTER — Ambulatory Visit (HOSPITAL_COMMUNITY)

## 2024-12-07 DIAGNOSIS — Z9181 History of falling: Secondary | ICD-10-CM | POA: Diagnosis not present

## 2024-12-07 DIAGNOSIS — M6281 Muscle weakness (generalized): Secondary | ICD-10-CM

## 2024-12-07 NOTE — Therapy (Signed)
 OUTPATIENT PHYSICAL THERAPY NEURO TREATMENT   Patient Name: [name] MRN: [id number] DOB:02-02-1929, 89 y.o., male Today's Date: 12/07/2024   PCP: Sheryle Carwin, MD  REFERRING PROVIDER: Sheryle Carwin, MD   END OF SESSION:  PT End of Session - 12/07/24 0727     Visit Number 8    Number of Visits 12    Date for Recertification  01/19/25    Authorization Type Medicare part A & B with [employer] secondary    Authorization Time Period no auth required    Progress Note Due on Visit 10    PT Start Time 0730    PT Stop Time 0812    PT Time Calculation (min) 42 min    Equipment Utilized During Treatment --    Activity Tolerance Patient tolerated treatment well    Behavior During Therapy [facility] for tasks assessed/performed             Past Medical History:  Diagnosis Date   Aortic stenosis    Status post TAVR at [facility], November 2019   CAD (coronary artery disease)    DES to ostial RCA October 2019 at [facility]   Essential hypertension    Glaucoma    Guillain-Barre syndrome 06/2013   Treated at [facility]   History of nephrolithiasis    History of pneumonia    Hyperlipidemia    Patent ductus arteriosus    Surgically repaired at age 63   Prostate cancer (HCC)    Type 2 diabetes mellitus (HCC)    Past Surgical History:  Procedure Laterality Date   CARPAL TUNNEL RELEASE  2012   left   CARPAL TUNNEL RELEASE Right 06/12/2014   Procedure: RIGHT CARPAL TUNNEL RELEASE ;  Surgeon: Arley JONELLE Curia, MD;  Location: [facility];  Service: Orthopedics;  Laterality: Right;   CATARACT EXTRACTION W/PHACO Left 10/13/2016   Procedure: CATARACT EXTRACTION PHACO AND INTRAOCULAR LENS PLACEMENT LEFT EYE CDE=15.04;  Surgeon: Oneil Platts, MD;  Location: [facility];  Service: Ophthalmology;  Laterality: Left;  left   CATARACT EXTRACTION W/PHACO Right 11/03/2016   Procedure: CATARACT EXTRACTION PHACO AND INTRAOCULAR LENS PLACEMENT (IOC);  Surgeon: Oneil Platts, MD;  Location: [facility];  Service:  Ophthalmology;  Laterality: Right;  CDE: 15.10   COLONOSCOPY     KNEE ARTHROSCOPY  1962   left   Patent ductus arteriosus repair     1951 - age 40   ULNAR NERVE TRANSPOSITION Right 06/12/2014   Procedure: DECOMPRESSION ULNAR NERVE RIGHT ELBOW;  Surgeon: Arley JONELLE Curia, MD;  Location:  SURGERY CENTER;  Service: Orthopedics;  Laterality: Right;   VIDEO ASSISTED THORACOSCOPY (VATS)/THOROCOTOMY  8/14   left-guillian-barre   Patient Active Problem List   Diagnosis Date Noted   Coronary artery disease due to lipid rich plaque 12/22/2022   S/P TAVR (transcatheter aortic valve replacement) 11/02/2018   PAD (peripheral artery disease) 11/01/2018   CHF (congestive heart failure), NYHA class II, chronic, diastolic (HCC) 10/31/2018   Diabetes mellitus type 2, uncomplicated (HCC) 04/06/2017   Primary open angle glaucoma of both eyes, moderate stage 04/06/2017   Pseudophakia 04/06/2017   Lower extremity weakness 10/23/2013   Difficulty walking 10/23/2013   Guillain-Barre 07/13/2013   DVT of lower extremity (deep venous thrombosis) (HCC) 07/07/2013   Dyspnea 07/04/2013   Hyponatremia 07/04/2013   Diabetes (HCC) 07/03/2013   Hypertension 07/03/2013   Hyperlipidemia 07/03/2013   Lung mass 07/03/2013   Rash 07/03/2013    ONSET DATE: 6-8 months ago  REFERRING  DIAG: gait disturbance   THERAPY DIAG:  History of falling  Muscle weakness (generalized)  Rationale for Evaluation and Treatment: Rehabilitation  SUBJECTIVE:                                                                                                                                                                                             SUBJECTIVE STATEMENT: Patient reports that he feels good today. He tried doing the new exercise at home after his last appointment, but he finds that his feet slide on the bed when he tries to do bridges.   Eval: Patient feels that he has been walking around like I'm drunk. He feels  that it has been getting worse for the past 6-8 months. He did not have a mechanism of injury. He did have Guillan-Barre in 2014 and he never got his legs back after that. He fell backwards yesterday, but he thinks his heel hit something causing him to fall. He is not hurting from this fall. He has been able to get up without help after each of his falls. He had tried going to the [facility], but this did not seem to help. Pt accompanied by: self  PERTINENT HISTORY: CHF, HTN, type 2 diabetes, history of Guillain-Barre, glaucoma, and history  of cancer    PAIN:  Are you having pain? No  PRECAUTIONS: Fall  RED FLAGS: None   WEIGHT BEARING RESTRICTIONS: No  FALLS: Has patient fallen in last 6 months? Yes. Number of falls 3-4  LIVING ENVIRONMENT: Lives with: lives alone Lives in: House/apartment Stairs: Yes: Internal: 25 steps; can reach both and External: 6 steps; can reach both; step to pattern and avoids going upstairs inside home Has following equipment at home: Single point cane  PLOF: Independent  PATIENT GOALS: improved balance  OBJECTIVE:  Note: Objective measures were completed at Evaluation unless otherwise noted.  COGNITION: Overall cognitive status: Within functional limits for tasks assessed   SENSATION: Light touch: Impaired  and diminished sensation to light touch in bilateral lower extremities Patient reports that he has neuropathy in both legs from his hips down to his feet in both legs  COORDINATION: WFL  EDEMA:  No edema observed   MUSCLE TONE: WFL for activities assessed  POSTURE: forward head and flexed trunk   LOWER EXTREMITY ROM: WFL for activities assessed  LOWER EXTREMITY MMT:    MMT Right Eval Left Eval  Hip flexion 4-/5 3+/5  Hip extension    Hip abduction    Hip adduction 4+/5 4+/5  Hip internal rotation    Hip external rotation    Knee flexion 4/5 4/5  Knee extension 4/5 3+/5  Ankle dorsiflexion 3/5 3/5  Ankle plantarflexion    Ankle  inversion    Ankle eversion    (Blank rows = not tested)  TRANSFERS: Sit to stand: unable to complete without UE support from armrests  GAIT: Findings: Gait Characteristics: step through pattern, decreased stride length, decreased hip/knee flexion- Right, decreased hip/knee flexion- Left, Right foot flat, Left foot flat, poor foot clearance- Right, and poor foot clearance- Left, Assistive device utilized:Single point cane, Level of assistance: SBA, and Comments: patient utilized the [facility] in one hand and had the other hand on the wall for balance  FUNCTIONAL TESTS:  5 times sit to stand: 47.76 seconds; requires UE support from armrests  Timed up and go (TUG): 11/07/24: 41.28 seconds with cane 2 minute walk test: 12/2/2 MWT 159 ft with SPC DGI 11/23/24: 3/24 with SPC  PATIENT SURVEYS:  ABC scale: The Activities-Specific Balance Confidence (ABC) Scale 0% 10 20 30  40 50 60 70 80 90 100% No confidence<->completely confident  How confident are you that you will not lose your balance or become unsteady when you . . .   Date tested 10/31/24  Walk around the house 100%  2. Walk up or down stairs 100%  3. Bend over and pick up a slipper from in front of a closet floor 0%  4. Reach for a small can off a shelf at eye level 100%  5. Stand on tip toes and reach for something above your head 50%  6. Stand on a chair and reach for something 0%  7. Sweep the floor 0%  8. Walk outside the house to a car parked in the driveway 100%  9. Get into or out of a car 100%  10. Walk across a parking lot to the mall 50%  11. Walk up or down a ramp 25%  12. Walk in a crowded mall where people rapidly walk past you 25%  13. Are bumped into by people as you walk through the mall 50%  14. Step onto or off of an escalator while you are holding onto the railing 100%  15. Step onto or off an escalator while holding onto parcels such that you cannot hold onto the railing 0%  16. Walk outside on icy sidewalks 0%   Total: #/16  50%                                                                                                                                  TREATMENT DATE:                                     12/07/24 EXERCISE LOG  Exercise Repetitions and Resistance Comments  Nustep  L4 x 5 minutes  Using UE and LE   SLR  20 reps each    Glute sets  2.5 minutes w/ 5 second hold    Supine hip ADD isometric   2 minutes w/ 5 second hold    Seated HS curl  GTB x 20 reps each    Sit to stand 10 reps  Intermittent UE support; poor eccentric control   Cone taps   25 reps each  Anterior and lateral; BUE support from parallel bars   Standing hip extension  25 reps each  BUE support from parallel bars    Blank cell = exercise not performed today   12/05/24: - Nustep, 5 minutes, level 4 resistance, UE/LE average SPM 84 Supine: - Bridge 2x 10 5 holds with RTB around thigh Standing inside // bars 3# on ankles: - Marching 3RT - Hamstring curls 3RT - Sidestep with RTB around thighs 4RT  - Forward then lateral hurdles over 6 then 12in with HHA 2RT - Minisquats front of chair with cueing for mechanics 8 reps   11/30/24: - Nustep, 5 minutes, level 4 resistance, UE/LE - Rebounder red weight ball paired with STS able to throw/catch 10/10 2x5 reps - Toe tapping with 3# ankle 1 HHA 2x 10 reps on 6in step - Step up and over 6in step 2 HHA 6 reps each LE - Sidestep 3# on ankle, RTB around thighs, 4RT inside // bars  11/28/2024  Therapeutic Exercise: -Nustep, 5 minutes, level 5>4 resistance, pt cued for 80 spm -Hamstring curl machine, 2 sets of 10 reps, plate 4>5, pt cued for eccentric control -Leg press, 2 sets of 10 reps, plate 6 (3 reps on second set, left knee pain) Therapeutic Activity: -Standing normal BOS, 5 sets of trampoline toss attempts, 1-4 successful throws, pt requires mod assist for balance for BUE throw -Aeromat walks, in parallel bars, 1 lap of lateral and tandem steps, requires BUE  support for safety -Sit to stands with kettle bell, 2 sets of 3 reps, pt cued for core activation, nose over toes and keeping weight close to chest   11/23/24: Nustep, BUE and BLE, 5x seat 11 DGI 1. Gait level surface (0) Severe Impairment: Cannot walk 20' without assistance, severe gait deviations or imbalance. 2. Change in gait speed (1) Moderate Impairment: Makes only minor adjustments to walking speed, or accomplishes a change in speed with significant gait deviations, or changes speed but has significant gait deviations, or changes speed but loses balance but is able to recover and continue walking. 3. Gait with horizontal head turns (0) Severe Impairment: Performs task with severe disruption of gait, i.e., staggers outside 15 path, loses balance, stops, reaches for wall. 4. Gait with vertical head turns 1) Moderate Impairment: Performs head turns with moderate change in gait velocity, slows down, staggers but recovers, can continue to walk. 5. Gait and pivot turn (0) Severe Impairment: Cannot turn safely, requires assistance to turn and stop. 6. Step over obstacle (0) Severe Impairment: Cannot perform without assistance. 7. Step around obstacles (1) Moderate Impairment: Is able to clear cones but must significantly slow, speed to accomplish task, or requires verbal cueing. 8. Stairs (1) Moderate Impairment: Two feet to a stair, must use rail.  TOTAL SCORE: 3 / 24 Sit to stand with foam in chair without UE assist x 5 (uses legs on back of chair to assist) 2# marching 2 x 10 2# sidestepping length of // bars x 2  PATIENT EDUCATION: Education details: muscle strengthening, POC, HEP, and expectation for soreness  Person educated: Patient Education method: Explanation, Demonstration, and Handouts Education comprehension: verbalized understanding and  returned demonstration  HOME EXERCISE PROGRAM: Access Code: [email or web address] URL: [email or web address] Date:  10/31/2024 Prepared by: Lacinda Fass  Exercises - Seated March  - 1-2 x daily - 7 x weekly - 3 sets - 10 reps - Seated Long Arc Quad  - 1-2 x daily - 7 x weekly - 3 sets - 10 reps - Seated Heel Toe Raises  - 1-2 x daily - 7 x weekly - 3 sets - 10 reps  STS 3x10  added verbally 11/28/24 JAM   12/05/24: - Supine Bridge  - 1 x daily - 7 x weekly - 3 sets - 10 reps - 5 hold - Sit to Stand with Armchair  - 2 x daily - 7 x weekly - 1 sets - 10 reps GOALS: Goals reviewed with patient? Yes  SHORT TERM GOALS: Target date: 11/21/24  Patient will be independent with his initial HEP. Baseline: Goal status: MET  2.  Patient will be able to transfer from sitting to standing without upper extremity support for improved independence. Baseline:  Goal status: INITIAL  3.  Patient will improve his 5 times sit to stand time to 30 seconds or less for improved lower extremity power. Baseline:  Goal status: INITIAL  LONG TERM GOALS: Target date: 12/12/24  Patient will be independent with his advanced HEP. Baseline:  Goal status: ON GOING  2.  Patient will improve his 5 times sit to stand time to 20 seconds or less for improved functional mobility. Baseline:  Goal status: INITIAL  3.  Patient will improve his ABC scale by at least 15% for improved perceived function with his daily activities. Baseline:  Goal status: INITIAL  4.  Patient will be able to navigate at least 4 steps while utilizing only 1 railing for improved household mobility. Baseline:  Goal status: INITIAL  ASSESSMENT:  CLINICAL IMPRESSION: Today's treatment was initiated with supine interventions to modify his HEP as he experienced difficulty with bridges at home. He required minimal cueing with glute sets for proper positioning to facilitate proper muscular engagement. He experienced no increase in pain or discomfort with any of today's interventions. He reported feeling alright upon the conclusion of treatment. Patient  continues to require skilled physical therapy to address her remaining impairments to return to her prior level of function.    Eval: Patient is a 89 y.o. male who was seen today for physical therapy evaluation and treatment for unsteadiness on his feet. He is a high fall risk as evidenced by his gait mechanics, objective and functional testing, and his history of falling. He also exhibited diminished sensation to light touch in both lower extremities. Recommend that he continue with skilled physical therapy to address his impairments to maximize his safety and functional mobility.    OBJECTIVE IMPAIRMENTS: Abnormal gait, decreased activity tolerance, decreased balance, decreased mobility, difficulty walking, and decreased strength.   ACTIVITY LIMITATIONS: standing, squatting, stairs, transfers, and locomotion level  PARTICIPATION LIMITATIONS: driving, shopping, community activity, and yard work  PERSONAL FACTORS: Age, Behavior pattern, Past/current experiences, Time since onset of injury/illness/exacerbation, and 3+ comorbidities: CHF, HTN, type 2 diabetes, history of Guillain-Barre, glaucoma, and history  of cancer   are also affecting patient's functional outcome.   REHAB POTENTIAL: Fair    CLINICAL DECISION MAKING: Evolving/moderate complexity  EVALUATION COMPLEXITY: Moderate  PLAN:  PT FREQUENCY: 2x/week  PT DURATION: 6 weeks  PLANNED INTERVENTIONS: 97164- PT Re-evaluation, 97750- Physical Performance Testing, 97110-Therapeutic exercises, 97530- Therapeutic activity, [id number]- Neuromuscular re-education, 97535- Self Care, [id number]- Manual  therapy, [phone]- Gait training, Patient/Family education, Balance training, and Stair training  PLAN FOR NEXT SESSION: Encourage use of RW for daily activity, gait training, balance interventions, and lower extremity strengthening (utilize gait belt for standing interventions due to high fall risk) ankle mobility.    Lacinda Fass, PT, DPT  8:21 AM,  12/07/2024

## 2024-12-12 ENCOUNTER — Encounter (HOSPITAL_COMMUNITY): Payer: Self-pay

## 2024-12-12 ENCOUNTER — Ambulatory Visit (HOSPITAL_COMMUNITY)

## 2024-12-12 DIAGNOSIS — Z9181 History of falling: Secondary | ICD-10-CM

## 2024-12-12 DIAGNOSIS — M6281 Muscle weakness (generalized): Secondary | ICD-10-CM

## 2024-12-12 NOTE — Therapy (Signed)
 " OUTPATIENT PHYSICAL THERAPY NEURO TREATMENT   Patient Name: [name] MRN: [id number] DOB:07/01/1929, 89 y.o., male Today's Date: 12/12/2024   PCP: Sheryle Carwin, MD  REFERRING PROVIDER: Sheryle Carwin, MD   END OF SESSION:  PT End of Session - 12/12/24 0733     Visit Number 9    Number of Visits 12    Date for Recertification  01/19/25    Authorization Type Medicare part A & B with [employer] secondary    Authorization Time Period no auth required    Progress Note Due on Visit 10    PT Start Time 0734    PT Stop Time 0812    PT Time Calculation (min) 38 min    Equipment Utilized During Treatment Gait belt    Activity Tolerance Patient tolerated treatment well    Behavior During Therapy [facility] for tasks assessed/performed             Past Medical History:  Diagnosis Date   Aortic stenosis    Status post TAVR at [facility], November 2019   CAD (coronary artery disease)    DES to ostial RCA October 2019 at [facility]   Essential hypertension    Glaucoma    Guillain-Barre syndrome 06/2013   Treated at [facility]   History of nephrolithiasis    History of pneumonia    Hyperlipidemia    Patent ductus arteriosus    Surgically repaired at age 88   Prostate cancer (HCC)    Type 2 diabetes mellitus (HCC)    Past Surgical History:  Procedure Laterality Date   CARPAL TUNNEL RELEASE  2012   left   CARPAL TUNNEL RELEASE Right 06/12/2014   Procedure: RIGHT CARPAL TUNNEL RELEASE ;  Surgeon: Arley JONELLE Curia, MD;  Location: [facility];  Service: Orthopedics;  Laterality: Right;   CATARACT EXTRACTION W/PHACO Left 10/13/2016   Procedure: CATARACT EXTRACTION PHACO AND INTRAOCULAR LENS PLACEMENT LEFT EYE CDE=15.04;  Surgeon: Oneil Platts, MD;  Location: [facility];  Service: Ophthalmology;  Laterality: Left;  left   CATARACT EXTRACTION W/PHACO Right 11/03/2016   Procedure: CATARACT EXTRACTION PHACO AND INTRAOCULAR LENS PLACEMENT (IOC);  Surgeon: Oneil Platts, MD;  Location: [facility];  Service:  Ophthalmology;  Laterality: Right;  CDE: 15.10   COLONOSCOPY     KNEE ARTHROSCOPY  1962   left   Patent ductus arteriosus repair     1951 - age 89   ULNAR NERVE TRANSPOSITION Right 06/12/2014   Procedure: DECOMPRESSION ULNAR NERVE RIGHT ELBOW;  Surgeon: Arley JONELLE Curia, MD;  Location: [facility];  Service: Orthopedics;  Laterality: Right;   VIDEO ASSISTED THORACOSCOPY (VATS)/THOROCOTOMY  8/14   left-guillian-barre   Patient Active Problem List   Diagnosis Date Noted   Coronary artery disease due to lipid rich plaque 12/22/2022   S/P TAVR (transcatheter aortic valve replacement) 11/02/2018   PAD (peripheral artery disease) 11/01/2018   CHF (congestive heart failure), NYHA class II, chronic, diastolic (HCC) 10/31/2018   Diabetes mellitus type 2, uncomplicated (HCC) 04/06/2017   Primary open angle glaucoma of both eyes, moderate stage 04/06/2017   Pseudophakia 04/06/2017   Lower extremity weakness 10/23/2013   Difficulty walking 10/23/2013   Guillain-Barre 07/13/2013   DVT of lower extremity (deep venous thrombosis) (HCC) 07/07/2013   Dyspnea 07/04/2013   Hyponatremia 07/04/2013   Diabetes (HCC) 07/03/2013   Hypertension 07/03/2013   Hyperlipidemia 07/03/2013   Lung mass 07/03/2013   Rash 07/03/2013    ONSET DATE: 6-8 months ago  REFERRING DIAG: gait disturbance   THERAPY DIAG:  History of falling  Muscle weakness (generalized)  Rationale for Evaluation and Treatment: Rehabilitation  SUBJECTIVE:                                                                                                                                                                                             SUBJECTIVE STATEMENT: Feels he is making improvements as doesn't feel as wobbly when walking.  Continues to have difficulty standing from chairs.    Eval: Patient feels that he has been walking around like I'm drunk. He feels that it has been getting worse for the past 6-8 months.  He did not have a mechanism of injury. He did have Guillan-Barre in 2014 and he never got his legs back after that. He fell backwards yesterday, but he thinks his heel hit something causing him to fall. He is not hurting from this fall. He has been able to get up without help after each of his falls. He had tried going to the [facility], but this did not seem to help. Pt accompanied by: self  PERTINENT HISTORY: CHF, HTN, type 2 diabetes, history of Guillain-Barre, glaucoma, and history  of cancer    PAIN:  Are you having pain? No  PRECAUTIONS: Fall  RED FLAGS: None   WEIGHT BEARING RESTRICTIONS: No  FALLS: Has patient fallen in last 6 months? Yes. Number of falls 3-4  LIVING ENVIRONMENT: Lives with: lives alone Lives in: House/apartment Stairs: Yes: Internal: 25 steps; can reach both and External: 6 steps; can reach both; step to pattern and avoids going upstairs inside home Has following equipment at home: Single point cane  PLOF: Independent  PATIENT GOALS: improved balance  OBJECTIVE:  Note: Objective measures were completed at Evaluation unless otherwise noted.  COGNITION: Overall cognitive status: Within functional limits for tasks assessed   SENSATION: Light touch: Impaired  and diminished sensation to light touch in bilateral lower extremities Patient reports that he has neuropathy in both legs from his hips down to his feet in both legs  COORDINATION: WFL  EDEMA:  No edema observed   MUSCLE TONE: WFL for activities assessed  POSTURE: forward head and flexed trunk   LOWER EXTREMITY ROM: WFL for activities assessed  LOWER EXTREMITY MMT:    MMT Right Eval Left Eval  Hip flexion 4-/5 3+/5  Hip extension    Hip abduction    Hip adduction 4+/5 4+/5  Hip internal rotation    Hip external rotation    Knee flexion 4/5 4/5  Knee extension 4/5 3+/5  Ankle dorsiflexion 3/5 3/5  Ankle plantarflexion  Ankle inversion    Ankle eversion    (Blank rows = not  tested)  TRANSFERS: Sit to stand: unable to complete without UE support from armrests  GAIT: Findings: Gait Characteristics: step through pattern, decreased stride length, decreased hip/knee flexion- Right, decreased hip/knee flexion- Left, Right foot flat, Left foot flat, poor foot clearance- Right, and poor foot clearance- Left, Assistive device utilized:Single point cane, Level of assistance: SBA, and Comments: patient utilized the [facility] in one hand and had the other hand on the wall for balance  FUNCTIONAL TESTS:  5 times sit to stand: 47.76 seconds; requires UE support from armrests  Timed up and go (TUG): 11/07/24: 41.28 seconds with cane 2 minute walk test: 12/2/2 MWT 159 ft with SPC DGI 11/23/24: 3/24 with SPC  PATIENT SURVEYS:  ABC scale: The Activities-Specific Balance Confidence (ABC) Scale 0% 10 20 30  40 50 60 70 80 90 100% No confidence<->completely confident  How confident are you that you will not lose your balance or become unsteady when you . . .   Date tested 10/31/24  Walk around the house 100%  2. Walk up or down stairs 100%  3. Bend over and pick up a slipper from in front of a closet floor 0%  4. Reach for a small can off a shelf at eye level 100%  5. Stand on tip toes and reach for something above your head 50%  6. Stand on a chair and reach for something 0%  7. Sweep the floor 0%  8. Walk outside the house to a car parked in the driveway 100%  9. Get into or out of a car 100%  10. Walk across a parking lot to the mall 50%  11. Walk up or down a ramp 25%  12. Walk in a crowded mall where people rapidly walk past you 25%  13. Are bumped into by people as you walk through the mall 50%  14. Step onto or off of an escalator while you are holding onto the railing 100%  15. Step onto or off an escalator while holding onto parcels such that you cannot hold onto the railing 0%  16. Walk outside on icy sidewalks 0%  Total: #/16  50%                                                                                                                                   TREATMENT DATE:   12/12/24 - Nustep, 5 minutes, level 4 resistance, UE/LE average SPM 79 STanding inside // bars with 4# on ankles, 1 to 2 UE support: - Heel raise  -Toe tapping 6in alternating 1 HHA - Lateral toe tapping between 6 and 8in step 20x - Hamstring curls alternating 20x - Sidestep 4RT 4# on ankle, RTB around thigh - Monster forward/backward 4RT, RTB around thigh - Rebounder with weighted yellow ball 3x 5-10  12/07/24 EXERCISE LOG  Exercise Repetitions and Resistance Comments  Nustep  L4 x 5 minutes  Using UE and LE   SLR  20 reps each    Glute sets   2.5 minutes w/ 5 second hold    Supine hip ADD isometric   2 minutes w/ 5 second hold    Seated HS curl  GTB x 20 reps each    Sit to stand 10 reps  Intermittent UE support; poor eccentric control   Cone taps   25 reps each  Anterior and lateral; BUE support from parallel bars   Standing hip extension  25 reps each  BUE support from parallel bars    Blank cell = exercise not performed today   12/05/24: - Nustep, 5 minutes, level 4 resistance, UE/LE average SPM 84 Supine: - Bridge 2x 10 5 holds with RTB around thigh Standing inside // bars 3# on ankles: - Marching 3RT - Hamstring curls 3RT - Sidestep with RTB around thighs 4RT  - Forward then lateral hurdles over 6 then 12in with HHA 2RT - Minisquats front of chair with cueing for mechanics 8 reps   11/30/24: - Nustep, 5 minutes, level 4 resistance, UE/LE - Rebounder red weight ball paired with STS able to throw/catch 10/10 2x5 reps - Toe tapping with 3# ankle 1 HHA 2x 10 reps on 6in step - Step up and over 6in step 2 HHA 6 reps each LE - Sidestep 3# on ankle, RTB around thighs, 4RT inside // bars  11/28/2024  Therapeutic Exercise: -Nustep, 5 minutes, level 5>4 resistance, pt cued for 80 spm -Hamstring curl machine, 2 sets of 10  reps, plate 4>5, pt cued for eccentric control -Leg press, 2 sets of 10 reps, plate 6 (3 reps on second set, left knee pain) Therapeutic Activity: -Standing normal BOS, 5 sets of trampoline toss attempts, 1-4 successful throws, pt requires mod assist for balance for BUE throw -Aeromat walks, in parallel bars, 1 lap of lateral and tandem steps, requires BUE support for safety -Sit to stands with kettle bell, 2 sets of 3 reps, pt cued for core activation, nose over toes and keeping weight close to chest   11/23/24: Nustep, BUE and BLE, 5x seat 11 DGI 1. Gait level surface (0) Severe Impairment: Cannot walk 20' without assistance, severe gait deviations or imbalance. 2. Change in gait speed (1) Moderate Impairment: Makes only minor adjustments to walking speed, or accomplishes a change in speed with significant gait deviations, or changes speed but has significant gait deviations, or changes speed but loses balance but is able to recover and continue walking. 3. Gait with horizontal head turns (0) Severe Impairment: Performs task with severe disruption of gait, i.e., staggers outside 15 path, loses balance, stops, reaches for wall. 4. Gait with vertical head turns 1) Moderate Impairment: Performs head turns with moderate change in gait velocity, slows down, staggers but recovers, can continue to walk. 5. Gait and pivot turn (0) Severe Impairment: Cannot turn safely, requires assistance to turn and stop. 6. Step over obstacle (0) Severe Impairment: Cannot perform without assistance. 7. Step around obstacles (1) Moderate Impairment: Is able to clear cones but must significantly slow, speed to accomplish task, or requires verbal cueing. 8. Stairs (1) Moderate Impairment: Two feet to a stair, must use rail.  TOTAL SCORE: 3 / 24 Sit to stand with foam in chair without UE assist x 5 (uses legs on back of chair to assist) 2# marching 2 x 10 2#  sidestepping length of // bars x 2  PATIENT  EDUCATION: Education details: muscle strengthening, POC, HEP, and expectation for soreness  Person educated: Patient Education method: Explanation, Demonstration, and Handouts Education comprehension: verbalized understanding and returned demonstration  HOME EXERCISE PROGRAM: Access Code: [email or web address] URL: [email or web address] Date: 10/31/2024 Prepared by: Lacinda Fass  Exercises - Seated March  - 1-2 x daily - 7 x weekly - 3 sets - 10 reps - Seated Long Arc Quad  - 1-2 x daily - 7 x weekly - 3 sets - 10 reps - Seated Heel Toe Raises  - 1-2 x daily - 7 x weekly - 3 sets - 10 reps  STS 3x10  added verbally 11/28/24 JAM   12/05/24: - Supine Bridge  - 1 x daily - 7 x weekly - 3 sets - 10 reps - 5 hold - Sit to Stand with Armchair  - 2 x daily - 7 x weekly - 1 sets - 10 reps GOALS: Goals reviewed with patient? Yes  SHORT TERM GOALS: Target date: 11/21/24  Patient will be independent with his initial HEP. Baseline: Goal status: MET  2.  Patient will be able to transfer from sitting to standing without upper extremity support for improved independence. Baseline:  Goal status: INITIAL  3.  Patient will improve his 5 times sit to stand time to 30 seconds or less for improved lower extremity power. Baseline:  Goal status: INITIAL  LONG TERM GOALS: Target date: 12/12/24  Patient will be independent with his advanced HEP. Baseline:  Goal status: ON GOING  2.  Patient will improve his 5 times sit to stand time to 20 seconds or less for improved functional mobility. Baseline:  Goal status: INITIAL  3.  Patient will improve his ABC scale by at least 15% for improved perceived function with his daily activities. Baseline:  Goal status: INITIAL  4.  Patient will be able to navigate at least 4 steps while utilizing only 1 railing for improved household mobility. Baseline:  Goal status: INITIAL  ASSESSMENT:  CLINICAL IMPRESSION: Session focus with LE strengthening  and balance training.  Pt presents with improved activity tolerance, no rest breaks required this session.  Continues to demonstrated gluteal weakness with inability to complete STS without HHA.  Pt required intermittent HHA and min A with gait associated balance activities for safety.  Elevated chair with additional 2in foam, pt able to stand without HHA, did required some cueing for nose over toes to assist with standings.  No reports of pain through session, was limited by appropriate amount of fatigue.  Eval: Patient is a 89 y.o. male who was seen today for physical therapy evaluation and treatment for unsteadiness on his feet. He is a high fall risk as evidenced by his gait mechanics, objective and functional testing, and his history of falling. He also exhibited diminished sensation to light touch in both lower extremities. Recommend that he continue with skilled physical therapy to address his impairments to maximize his safety and functional mobility.    OBJECTIVE IMPAIRMENTS: Abnormal gait, decreased activity tolerance, decreased balance, decreased mobility, difficulty walking, and decreased strength.   ACTIVITY LIMITATIONS: standing, squatting, stairs, transfers, and locomotion level  PARTICIPATION LIMITATIONS: driving, shopping, community activity, and yard work  PERSONAL FACTORS: Age, Behavior pattern, Past/current experiences, Time since onset of injury/illness/exacerbation, and 3+ comorbidities: CHF, HTN, type 2 diabetes, history of Guillain-Barre, glaucoma, and history  of cancer   are also affecting patient's functional outcome.   REHAB POTENTIAL: Fair  CLINICAL DECISION MAKING: Evolving/moderate complexity  EVALUATION COMPLEXITY: Moderate  PLAN:  PT FREQUENCY: 2x/week  PT DURATION: 6 weeks  PLANNED INTERVENTIONS: 97164- PT Re-evaluation, 97750- Physical Performance Testing, 97110-Therapeutic exercises, 97530- Therapeutic activity, 97112- Neuromuscular re-education, 97535-  Self Care, [id number]- Manual therapy, [id number]- Gait training, Patient/Family education, Balance training, and Stair training  PLAN FOR NEXT SESSION: Encourage use of RW for daily activity, gait training, balance interventions, and lower extremity strengthening (utilize gait belt for standing interventions due to high fall risk) ankle mobility.  10th visit progress note next session.  Augustin Mclean, LPTA/CLT; CBIS [phone]  3:08 PM, 12/12/2024  "

## 2024-12-19 ENCOUNTER — Ambulatory Visit (HOSPITAL_COMMUNITY)

## 2024-12-19 ENCOUNTER — Encounter (HOSPITAL_COMMUNITY): Payer: Self-pay

## 2024-12-19 DIAGNOSIS — Z9181 History of falling: Secondary | ICD-10-CM

## 2024-12-19 DIAGNOSIS — M6281 Muscle weakness (generalized): Secondary | ICD-10-CM

## 2024-12-19 NOTE — Therapy (Signed)
 " OUTPATIENT PHYSICAL THERAPY NEURO TREATMENT   Patient Name: [name] MRN: [id number] DOB:August 14, 1929, 89 y.o., male Today's Date: 12/19/2024   PCP: Sheryle Carwin, MD  REFERRING PROVIDER: Sheryle Carwin, MD   END OF SESSION:  PT End of Session - 12/19/24 0732     Visit Number 10    Number of Visits 12    Date for Recertification  01/19/25    Authorization Type Medicare part A & B with [employer] secondary    Authorization Time Period no auth required    Progress Note Due on Visit 10    PT Start Time 0733    PT Stop Time 0814    PT Time Calculation (min) 41 min    Equipment Utilized During Treatment Gait belt    Activity Tolerance Patient tolerated treatment well    Behavior During Therapy [facility] for tasks assessed/performed              Past Medical History:  Diagnosis Date   Aortic stenosis    Status post TAVR at [facility], November 2019   CAD (coronary artery disease)    DES to ostial RCA October 2019 at [facility]   Essential hypertension    Glaucoma    Guillain-Barre syndrome 06/2013   Treated at [facility]   History of nephrolithiasis    History of pneumonia    Hyperlipidemia    Patent ductus arteriosus    Surgically repaired at age 43   Prostate cancer (HCC)    Type 2 diabetes mellitus (HCC)    Past Surgical History:  Procedure Laterality Date   CARPAL TUNNEL RELEASE  2012   left   CARPAL TUNNEL RELEASE Right 06/12/2014   Procedure: RIGHT CARPAL TUNNEL RELEASE ;  Surgeon: Arley JONELLE Curia, MD;  Location: [facility];  Service: Orthopedics;  Laterality: Right;   CATARACT EXTRACTION W/PHACO Left 10/13/2016   Procedure: CATARACT EXTRACTION PHACO AND INTRAOCULAR LENS PLACEMENT LEFT EYE CDE=15.04;  Surgeon: Oneil Platts, MD;  Location: [facility];  Service: Ophthalmology;  Laterality: Left;  left   CATARACT EXTRACTION W/PHACO Right 11/03/2016   Procedure: CATARACT EXTRACTION PHACO AND INTRAOCULAR LENS PLACEMENT (IOC);  Surgeon: Oneil Platts, MD;  Location: [facility];   Service: Ophthalmology;  Laterality: Right;  CDE: 15.10   COLONOSCOPY     KNEE ARTHROSCOPY  1962   left   Patent ductus arteriosus repair     1951 - age 56   ULNAR NERVE TRANSPOSITION Right 06/12/2014   Procedure: DECOMPRESSION ULNAR NERVE RIGHT ELBOW;  Surgeon: Arley JONELLE Curia, MD;  Location: [facility];  Service: Orthopedics;  Laterality: Right;   VIDEO ASSISTED THORACOSCOPY (VATS)/THOROCOTOMY  8/14   left-guillian-barre   Patient Active Problem List   Diagnosis Date Noted   Coronary artery disease due to lipid rich plaque 12/22/2022   S/P TAVR (transcatheter aortic valve replacement) 11/02/2018   PAD (peripheral artery disease) 11/01/2018   CHF (congestive heart failure), NYHA class II, chronic, diastolic (HCC) 10/31/2018   Diabetes mellitus type 2, uncomplicated (HCC) 04/06/2017   Primary open angle glaucoma of both eyes, moderate stage 04/06/2017   Pseudophakia 04/06/2017   Lower extremity weakness 10/23/2013   Difficulty walking 10/23/2013   Guillain-Barre 07/13/2013   DVT of lower extremity (deep venous thrombosis) (HCC) 07/07/2013   Dyspnea 07/04/2013   Hyponatremia 07/04/2013   Diabetes (HCC) 07/03/2013   Hypertension 07/03/2013   Hyperlipidemia 07/03/2013   Lung mass 07/03/2013   Rash 07/03/2013    ONSET DATE: 6-8 months  ago  REFERRING DIAG: gait disturbance   THERAPY DIAG:  History of falling  Muscle weakness (generalized)  Rationale for Evaluation and Treatment: Rehabilitation  SUBJECTIVE:                                                                                                                                                                                             SUBJECTIVE STATEMENT: Patient reports that he feels good today. He feels like he has improved since starting physical therapy, but he feels that his improvement is less than 50%. However, he feels that most of his improvement has been in the last 2-3 weeks.   Eval: Patient  feels that he has been walking around like I'm drunk. He feels that it has been getting worse for the past 6-8 months. He did not have a mechanism of injury. He did have Guillan-Barre in 2014 and he never got his legs back after that. He fell backwards yesterday, but he thinks his heel hit something causing him to fall. He is not hurting from this fall. He has been able to get up without help after each of his falls. He had tried going to the [facility], but this did not seem to help. Pt accompanied by: self  PERTINENT HISTORY: CHF, HTN, type 2 diabetes, history of Guillain-Barre, glaucoma, and history  of cancer    PAIN:  Are you having pain? No  PRECAUTIONS: Fall  RED FLAGS: None   WEIGHT BEARING RESTRICTIONS: No  FALLS: Has patient fallen in last 6 months? Yes. Number of falls 3-4  LIVING ENVIRONMENT: Lives with: lives alone Lives in: House/apartment Stairs: Yes: Internal: 25 steps; can reach both and External: 6 steps; can reach both; step to pattern and avoids going upstairs inside home Has following equipment at home: Single point cane  PLOF: Independent  PATIENT GOALS: improved balance  OBJECTIVE:  Note: Objective measures were completed at Evaluation unless otherwise noted.  COGNITION: Overall cognitive status: Within functional limits for tasks assessed   SENSATION: Light touch: Impaired  and diminished sensation to light touch in bilateral lower extremities Patient reports that he has neuropathy in both legs from his hips down to his feet in both legs  COORDINATION: WFL  EDEMA:  No edema observed   MUSCLE TONE: WFL for activities assessed  POSTURE: forward head and flexed trunk   LOWER EXTREMITY ROM: WFL for activities assessed  LOWER EXTREMITY MMT:    MMT Right Eval Left Eval  Hip flexion 4-/5 3+/5  Hip extension    Hip abduction    Hip adduction 4+/5 4+/5  Hip internal rotation  Hip external rotation    Knee flexion 4/5 4/5  Knee extension 4/5  3+/5  Ankle dorsiflexion 3/5 3/5  Ankle plantarflexion    Ankle inversion    Ankle eversion    (Blank rows = not tested)  TRANSFERS: Sit to stand: unable to complete without UE support from armrests  GAIT: Findings: Gait Characteristics: step through pattern, decreased stride length, decreased hip/knee flexion- Right, decreased hip/knee flexion- Left, Right foot flat, Left foot flat, poor foot clearance- Right, and poor foot clearance- Left, Assistive device utilized:Single point cane, Level of assistance: SBA, and Comments: patient utilized the [facility] in one hand and had the other hand on the wall for balance  FUNCTIONAL TESTS:  5 times sit to stand: 47.76 seconds; requires UE support from armrests  Timed up and go (TUG): 11/07/24: 41.28 seconds with cane 2 minute walk test: 12/2/2 MWT 159 ft with SPC DGI 11/23/24: 3/24 with SPC  PATIENT SURVEYS:  ABC scale: The Activities-Specific Balance Confidence (ABC) Scale 0% 10 20 30  40 50 60 70 80 90 100% No confidence<->completely confident  How confident are you that you will not lose your balance or become unsteady when you . . .   Date tested 10/31/24 12/19/24  Walk around the house 100% 85%  2. Walk up or down stairs 100% 100%  3. Bend over and pick up a slipper from in front of a closet floor 0% 20%  4. Reach for a small can off a shelf at eye level 100% 75%  5. Stand on tip toes and reach for something above your head 50% 80%  6. Stand on a chair and reach for something 0% 0%  7. Sweep the floor 0% 10%  8. Walk outside the house to a car parked in the driveway 100% 100%  9. Get into or out of a car 100% 100%  10. Walk across a parking lot to the mall 50% 75%  11. Walk up or down a ramp 25% 80%  12. Walk in a crowded mall where people rapidly walk past you 25% 50%  13. Are bumped into by people as you walk through the mall 50% 50%  14. Step onto or off of an escalator while you are holding onto the railing 100% 100%  15. Step onto  or off an escalator while holding onto parcels such that you cannot hold onto the railing 0% 0%  16. Walk outside on icy sidewalks 0% 0%  Total: #/16  50%   57.81%                                                                                                                                 TREATMENT DATE:                                     12/19/24 EXERCISE LOG  Exercise Repetitions and Resistance Comments  Nustep  L4 x 5 minutes    Goal assessment   See below    TUG   29.68 seconds with SPC    2 MWT  152 feet with SPC  CGA for safety   Patient education  See below     Blank cell = exercise not performed today   12/12/24 - Nustep, 5 minutes, level 4 resistance, UE/LE average SPM 79 STanding inside // bars with 4# on ankles, 1 to 2 UE support: - Heel raise  -Toe tapping 6in alternating 1 HHA - Lateral toe tapping between 6 and 8in step 20x - Hamstring curls alternating 20x - Sidestep 4RT 4# on ankle, RTB around thigh - Monster forward/backward 4RT, RTB around thigh - Rebounder with weighted yellow ball 3x 5-10                                    12/07/24 EXERCISE LOG  Exercise Repetitions and Resistance Comments  Nustep  L4 x 5 minutes  Using UE and LE   SLR  20 reps each    Glute sets   2.5 minutes w/ 5 second hold    Supine hip ADD isometric   2 minutes w/ 5 second hold    Seated HS curl  GTB x 20 reps each    Sit to stand 10 reps  Intermittent UE support; poor eccentric control   Cone taps   25 reps each  Anterior and lateral; BUE support from parallel bars   Standing hip extension  25 reps each  BUE support from parallel bars    Blank cell = exercise not performed today   12/05/24: - Nustep, 5 minutes, level 4 resistance, UE/LE average SPM 84 Supine: - Bridge 2x 10 5 holds with RTB around thigh Standing inside // bars 3# on ankles: - Marching 3RT - Hamstring curls 3RT - Sidestep with RTB around thighs 4RT  - Forward then lateral hurdles over 6 then 12in with  HHA 2RT - Minisquats front of chair with cueing for mechanics 8 reps   PATIENT EDUCATION: Education details: muscle strengthening, POC, progress with skilled physical therapy, objective findings, and importance of his HEP Person educated: Patient Education method: Explanation Education comprehension: verbalized understanding  HOME EXERCISE PROGRAM: Access Code: [email or web address] URL: [email or web address] Date: 10/31/2024 Prepared by: Lacinda Fass  Exercises - Seated March  - 1-2 x daily - 7 x weekly - 3 sets - 10 reps - Seated Long Arc Quad  - 1-2 x daily - 7 x weekly - 3 sets - 10 reps - Seated Heel Toe Raises  - 1-2 x daily - 7 x weekly - 3 sets - 10 reps  STS 3x10  added verbally 11/28/24 JAM   12/05/24: - Supine Bridge  - 1 x daily - 7 x weekly - 3 sets - 10 reps - 5 hold - Sit to Stand with Armchair  - 2 x daily - 7 x weekly - 1 sets - 10 reps GOALS: Goals reviewed with patient? Yes  SHORT TERM GOALS: Target date: 11/21/24  Patient will be independent with his initial HEP. Baseline: Goal status: MET  2.  Patient will be able to transfer from sitting to standing without upper extremity support for improved independence. Baseline: unable to clear seat without UE support  Goal status: ON GOING  3.  Patient will improve his 5 times sit to  stand time to 30 seconds or less for improved lower extremity power. Baseline: 12/19/24: 53.21 seconds with armrests  Goal status: ON GOING  LONG TERM GOALS: Target date: 12/12/24  Patient will be independent with his advanced HEP. Baseline:  Goal status: ON GOING  2.  Patient will improve his 5 times sit to stand time to 20 seconds or less for improved functional mobility. Baseline: 12/19/24: 53.21 seconds with armrests Goal status: ON GOING  3.  Patient will improve his ABC scale by at least 15% for improved perceived function with his daily activities. Baseline: 7% improvement on 12/19/24 Goal status: ON GOING  4.   Patient will be able to navigate at least 4 steps while utilizing only 1 railing for improved household mobility. Baseline:  Goal status: ON GOING  ASSESSMENT:  CLINICAL IMPRESSION: Patient is making fair progress with skilled physical therapy as evidenced by his subjective reports, objective measures, and progress towards his goals.  He was able to demonstrate significant improvement in his timed up and go time and his ABC scale.  However, he was unable to demonstrate a significant improvement in his 5 times sit to stand time and his 2-minute walk test.  He also continues to exhibit gait deviations and unsteadiness on his feet while walking.  He was educated on his progress with these objective and functional tests.  He reported understanding.  Recommend that he continue with skilled physical therapy to address his impairments to maximize his safety and functional mobility.  Eval: Patient is a 89 y.o. male who was seen today for physical therapy evaluation and treatment for unsteadiness on his feet. He is a high fall risk as evidenced by his gait mechanics, objective and functional testing, and his history of falling. He also exhibited diminished sensation to light touch in both lower extremities. Recommend that he continue with skilled physical therapy to address his impairments to maximize his safety and functional mobility.    OBJECTIVE IMPAIRMENTS: Abnormal gait, decreased activity tolerance, decreased balance, decreased mobility, difficulty walking, and decreased strength.   ACTIVITY LIMITATIONS: standing, squatting, stairs, transfers, and locomotion level  PARTICIPATION LIMITATIONS: driving, shopping, community activity, and yard work  PERSONAL FACTORS: Age, Behavior pattern, Past/current experiences, Time since onset of injury/illness/exacerbation, and 3+ comorbidities: CHF, HTN, type 2 diabetes, history of Guillain-Barre, glaucoma, and history  of cancer   are also affecting patient's  functional outcome.   REHAB POTENTIAL: Fair    CLINICAL DECISION MAKING: Evolving/moderate complexity  EVALUATION COMPLEXITY: Moderate  PLAN:  PT FREQUENCY: 2x/week  PT DURATION: 6 weeks  PLANNED INTERVENTIONS: 97164- PT Re-evaluation, 97750- Physical Performance Testing, 97110-Therapeutic exercises, 97530- Therapeutic activity, 97112- Neuromuscular re-education, 97535- Self Care, [id number]- Manual therapy, [id number]- Gait training, Patient/Family education, Balance training, and Stair training  PLAN FOR NEXT SESSION: Encourage use of RW for daily activity, gait training, balance interventions, and lower extremity strengthening (utilize gait belt for standing interventions due to high fall risk) ankle mobility.  10th visit progress note next session.  Lacinda Fass, PT, DPT  12:55 PM, 12/19/2024  "

## 2024-12-26 ENCOUNTER — Encounter (HOSPITAL_COMMUNITY): Payer: Self-pay

## 2024-12-26 ENCOUNTER — Ambulatory Visit (HOSPITAL_COMMUNITY): Attending: Internal Medicine

## 2024-12-26 DIAGNOSIS — Z9181 History of falling: Secondary | ICD-10-CM | POA: Insufficient documentation

## 2024-12-26 DIAGNOSIS — M6281 Muscle weakness (generalized): Secondary | ICD-10-CM | POA: Insufficient documentation

## 2024-12-26 NOTE — Therapy (Signed)
 " OUTPATIENT PHYSICAL THERAPY NEURO TREATMENT   Patient Name: [name] MRN: [id number] DOB:18-Aug-1929, 89 y.o., male Today's Date: 12/26/2024   PCP: Sheryle Carwin, MD  REFERRING PROVIDER: Sheryle Carwin, MD   END OF SESSION:  PT End of Session - 12/26/24 0733     Visit Number 11    Number of Visits 12    Date for Recertification  01/19/25    Authorization Type Medicare part A & B with [employer] secondary    Authorization Time Period no auth required    Progress Note Due on Visit 10    PT Start Time 0733    PT Stop Time 0814    PT Time Calculation (min) 41 min    Equipment Utilized During Treatment --    Activity Tolerance Patient tolerated treatment well    Behavior During Therapy [facility] for tasks assessed/performed               Past Medical History:  Diagnosis Date   Aortic stenosis    Status post TAVR at [facility], November 2019   CAD (coronary artery disease)    DES to ostial RCA October 2019 at [facility]   Essential hypertension    Glaucoma    Guillain-Barre syndrome 06/2013   Treated at [facility]   History of nephrolithiasis    History of pneumonia    Hyperlipidemia    Patent ductus arteriosus    Surgically repaired at age 67   Prostate cancer (HCC)    Type 2 diabetes mellitus (HCC)    Past Surgical History:  Procedure Laterality Date   CARPAL TUNNEL RELEASE  2012   left   CARPAL TUNNEL RELEASE Right 06/12/2014   Procedure: RIGHT CARPAL TUNNEL RELEASE ;  Surgeon: Arley JONELLE Curia, MD;  Location: [facility];  Service: Orthopedics;  Laterality: Right;   CATARACT EXTRACTION W/PHACO Left 10/13/2016   Procedure: CATARACT EXTRACTION PHACO AND INTRAOCULAR LENS PLACEMENT LEFT EYE CDE=15.04;  Surgeon: Oneil Platts, MD;  Location: [facility];  Service: Ophthalmology;  Laterality: Left;  left   CATARACT EXTRACTION W/PHACO Right 11/03/2016   Procedure: CATARACT EXTRACTION PHACO AND INTRAOCULAR LENS PLACEMENT (IOC);  Surgeon: Oneil Platts, MD;  Location: [facility];  Service:  Ophthalmology;  Laterality: Right;  CDE: 15.10   COLONOSCOPY     KNEE ARTHROSCOPY  1962   left   Patent ductus arteriosus repair     1951 - age 3   ULNAR NERVE TRANSPOSITION Right 06/12/2014   Procedure: DECOMPRESSION ULNAR NERVE RIGHT ELBOW;  Surgeon: Arley JONELLE Curia, MD;  Location: [facility];  Service: Orthopedics;  Laterality: Right;   VIDEO ASSISTED THORACOSCOPY (VATS)/THOROCOTOMY  8/14   left-guillian-barre   Patient Active Problem List   Diagnosis Date Noted   Coronary artery disease due to lipid rich plaque 12/22/2022   S/P TAVR (transcatheter aortic valve replacement) 11/02/2018   PAD (peripheral artery disease) 11/01/2018   CHF (congestive heart failure), NYHA class II, chronic, diastolic (HCC) 10/31/2018   Diabetes mellitus type 2, uncomplicated (HCC) 04/06/2017   Primary open angle glaucoma of both eyes, moderate stage 04/06/2017   Pseudophakia 04/06/2017   Lower extremity weakness 10/23/2013   Difficulty walking 10/23/2013   Guillain-Barre 07/13/2013   DVT of lower extremity (deep venous thrombosis) (HCC) 07/07/2013   Dyspnea 07/04/2013   Hyponatremia 07/04/2013   Diabetes (HCC) 07/03/2013   Hypertension 07/03/2013   Hyperlipidemia 07/03/2013   Lung mass 07/03/2013   Rash 07/03/2013    ONSET DATE: 6-8 months  ago  REFERRING DIAG: gait disturbance   THERAPY DIAG:  History of falling  Muscle weakness (generalized)  Rationale for Evaluation and Treatment: Rehabilitation  SUBJECTIVE:                                                                                                                                                                                             SUBJECTIVE STATEMENT: Patient reports that he fell on Friday (12/22/24) after tripping on a brick. However, he notes that he was not injured as he fell onto his couch.   Eval: Patient feels that he has been walking around like I'm drunk. He feels that it has been getting worse for the  past 6-8 months. He did not have a mechanism of injury. He did have Guillan-Barre in 2014 and he never got his legs back after that. He fell backwards yesterday, but he thinks his heel hit something causing him to fall. He is not hurting from this fall. He has been able to get up without help after each of his falls. He had tried going to the [facility], but this did not seem to help. Pt accompanied by: self  PERTINENT HISTORY: CHF, HTN, type 2 diabetes, history of Guillain-Barre, glaucoma, and history  of cancer    PAIN:  Are you having pain? No  PRECAUTIONS: Fall  RED FLAGS: None   WEIGHT BEARING RESTRICTIONS: No  FALLS: Has patient fallen in last 6 months? Yes. Number of falls 3-4  LIVING ENVIRONMENT: Lives with: lives alone Lives in: House/apartment Stairs: Yes: Internal: 25 steps; can reach both and External: 6 steps; can reach both; step to pattern and avoids going upstairs inside home Has following equipment at home: Single point cane  PLOF: Independent  PATIENT GOALS: improved balance  OBJECTIVE:  Note: Objective measures were completed at Evaluation unless otherwise noted.  COGNITION: Overall cognitive status: Within functional limits for tasks assessed   SENSATION: Light touch: Impaired  and diminished sensation to light touch in bilateral lower extremities Patient reports that he has neuropathy in both legs from his hips down to his feet in both legs  COORDINATION: WFL  EDEMA:  No edema observed   MUSCLE TONE: WFL for activities assessed  POSTURE: forward head and flexed trunk   LOWER EXTREMITY ROM: WFL for activities assessed  LOWER EXTREMITY MMT:    MMT Right Eval Left Eval  Hip flexion 4-/5 3+/5  Hip extension    Hip abduction    Hip adduction 4+/5 4+/5  Hip internal rotation    Hip external rotation    Knee flexion 4/5 4/5  Knee extension 4/5 3+/5  Ankle dorsiflexion 3/5 3/5  Ankle plantarflexion    Ankle inversion    Ankle eversion    (Blank  rows = not tested)  TRANSFERS: Sit to stand: unable to complete without UE support from armrests  GAIT: Findings: Gait Characteristics: step through pattern, decreased stride length, decreased hip/knee flexion- Right, decreased hip/knee flexion- Left, Right foot flat, Left foot flat, poor foot clearance- Right, and poor foot clearance- Left, Assistive device utilized:Single point cane, Level of assistance: SBA, and Comments: patient utilized the [facility] in one hand and had the other hand on the wall for balance  FUNCTIONAL TESTS:  5 times sit to stand: 47.76 seconds; requires UE support from armrests  Timed up and go (TUG): 11/07/24: 41.28 seconds with cane 2 minute walk test: 12/2/2 MWT 159 ft with SPC DGI 11/23/24: 3/24 with SPC  PATIENT SURVEYS:  ABC scale: The Activities-Specific Balance Confidence (ABC) Scale 0% 10 20 30  40 50 60 70 80 90 100% No confidence<->completely confident  How confident are you that you will not lose your balance or become unsteady when you . . .   Date tested 10/31/24 12/19/24  Walk around the house 100% 85%  2. Walk up or down stairs 100% 100%  3. Bend over and pick up a slipper from in front of a closet floor 0% 20%  4. Reach for a small can off a shelf at eye level 100% 75%  5. Stand on tip toes and reach for something above your head 50% 80%  6. Stand on a chair and reach for something 0% 0%  7. Sweep the floor 0% 10%  8. Walk outside the house to a car parked in the driveway 100% 100%  9. Get into or out of a car 100% 100%  10. Walk across a parking lot to the mall 50% 75%  11. Walk up or down a ramp 25% 80%  12. Walk in a crowded mall where people rapidly walk past you 25% 50%  13. Are bumped into by people as you walk through the mall 50% 50%  14. Step onto or off of an escalator while you are holding onto the railing 100% 100%  15. Step onto or off an escalator while holding onto parcels such that you cannot hold onto the railing 0% 0%  16. Walk  outside on icy sidewalks 0% 0%  Total: #/16  50%   57.81%                                                                                                                                 TREATMENT DATE:                                     12/26/24 EXERCISE LOG  Exercise Repetitions and Resistance Comments  Nustep  L4 x 5 minutes    Cone  taps  2 minutes  3 cones; anterior only; BUE support from parallel bars   Rocker board (seated)  3 minutes    LAQ  4# x 25 reps each   Seated march  4# x 25 reps each    Mini squat   15 reps  BUE support from parallel bars   Side stepping  2 minutes  BUE support from parallel bars   Seated hip ADD isometric  3 minutes w/ 5 second hold     Blank cell = exercise not performed today                                    12/19/24 EXERCISE LOG  Exercise Repetitions and Resistance Comments  Nustep  L4 x 5 minutes    Goal assessment   See below    TUG   29.68 seconds with SPC    2 MWT  152 feet with SPC  CGA for safety   Patient education  See below     Blank cell = exercise not performed today   12/12/24 - Nustep, 5 minutes, level 4 resistance, UE/LE average SPM 79 STanding inside // bars with 4# on ankles, 1 to 2 UE support: - Heel raise  -Toe tapping 6in alternating 1 HHA - Lateral toe tapping between 6 and 8in step 20x - Hamstring curls alternating 20x - Sidestep 4RT 4# on ankle, RTB around thigh - Monster forward/backward 4RT, RTB around thigh - Rebounder with weighted yellow ball 3x 5-10   PATIENT EDUCATION: Education details: activity modification and safety Person educated: Patient Education method: Explanation Education comprehension: verbalized understanding  HOME EXERCISE PROGRAM: Access Code: [email or web address] URL: [email or web address] Date: 10/31/2024 Prepared by: Lacinda Fass  Exercises - Seated March  - 1-2 x daily - 7 x weekly - 3 sets - 10 reps - Seated Long Arc Quad  - 1-2 x daily - 7 x weekly - 3 sets - 10 reps -  Seated Heel Toe Raises  - 1-2 x daily - 7 x weekly - 3 sets - 10 reps  STS 3x10  added verbally 11/28/24 JAM   12/05/24: - Supine Bridge  - 1 x daily - 7 x weekly - 3 sets - 10 reps - 5 hold - Sit to Stand with Armchair  - 2 x daily - 7 x weekly - 1 sets - 10 reps GOALS: Goals reviewed with patient? Yes  SHORT TERM GOALS: Target date: 11/21/24  Patient will be independent with his initial HEP. Baseline: Goal status: MET  2.  Patient will be able to transfer from sitting to standing without upper extremity support for improved independence. Baseline: unable to clear seat without UE support  Goal status: ON GOING  3.  Patient will improve his 5 times sit to stand time to 30 seconds or less for improved lower extremity power. Baseline: 12/19/24: 53.21 seconds with armrests  Goal status: ON GOING  LONG TERM GOALS: Target date: 12/12/24  Patient will be independent with his advanced HEP. Baseline:  Goal status: ON GOING  2.  Patient will improve his 5 times sit to stand time to 20 seconds or less for improved functional mobility. Baseline: 12/19/24: 53.21 seconds with armrests Goal status: ON GOING  3.  Patient will improve his ABC scale by at least 15% for improved perceived function with his daily activities. Baseline: 7% improvement on 12/19/24 Goal status: ON GOING  4.  Patient will be able to navigate at least 4 steps while utilizing only 1 railing for improved household mobility. Baseline:  Goal status: ON GOING  ASSESSMENT:  CLINICAL IMPRESSION: Patient presented to treatment reporting a fall on 12/22/24. Today's treatment focused on improved lower extremity strengthening and foot clearance needed to reduce his fall risk. He required moderate multimodal cueing with standing cone taps to promote upright stance. He exhibited increased unsteadiness with standing and ambulation. He reported feeling good upon the conclusion of treatment. Patient continues to require skilled  physical therapy to address her remaining impairments to maximize his safety and functional mobility.   Eval: Patient is a 89 y.o. male who was seen today for physical therapy evaluation and treatment for unsteadiness on his feet. He is a high fall risk as evidenced by his gait mechanics, objective and functional testing, and his history of falling. He also exhibited diminished sensation to light touch in both lower extremities. Recommend that he continue with skilled physical therapy to address his impairments to maximize his safety and functional mobility.    OBJECTIVE IMPAIRMENTS: Abnormal gait, decreased activity tolerance, decreased balance, decreased mobility, difficulty walking, and decreased strength.   ACTIVITY LIMITATIONS: standing, squatting, stairs, transfers, and locomotion level  PARTICIPATION LIMITATIONS: driving, shopping, community activity, and yard work  PERSONAL FACTORS: Age, Behavior pattern, Past/current experiences, Time since onset of injury/illness/exacerbation, and 3+ comorbidities: CHF, HTN, type 2 diabetes, history of Guillain-Barre, glaucoma, and history  of cancer   are also affecting patient's functional outcome.   REHAB POTENTIAL: Fair    CLINICAL DECISION MAKING: Evolving/moderate complexity  EVALUATION COMPLEXITY: Moderate  PLAN:  PT FREQUENCY: 2x/week  PT DURATION: 6 weeks  PLANNED INTERVENTIONS: 97164- PT Re-evaluation, 97750- Physical Performance Testing, 97110-Therapeutic exercises, 97530- Therapeutic activity, 97112- Neuromuscular re-education, 97535- Self Care, [id number]- Manual therapy, [phone]- Gait training, Patient/Family education, Balance training, and Stair training  PLAN FOR NEXT SESSION: Encourage use of RW for daily activity, gait training, balance interventions, and lower extremity strengthening (utilize gait belt for standing interventions due to high fall risk) ankle mobility.  10th visit progress note next session.  Lacinda Fass, PT, DPT   9:16 AM, 12/26/2024  "

## 2024-12-28 ENCOUNTER — Ambulatory Visit (HOSPITAL_COMMUNITY)

## 2024-12-28 ENCOUNTER — Encounter (HOSPITAL_COMMUNITY): Payer: Self-pay

## 2024-12-28 DIAGNOSIS — Z9181 History of falling: Secondary | ICD-10-CM

## 2024-12-28 DIAGNOSIS — M6281 Muscle weakness (generalized): Secondary | ICD-10-CM

## 2024-12-28 NOTE — Therapy (Signed)
 " OUTPATIENT PHYSICAL THERAPY NEURO TREATMENT   Patient Name: [name] MRN: [id number] DOB:Jul 11, 1929, 89 y.o., male Today's Date: 12/28/2024   PCP: Sheryle Carwin, MD  REFERRING PROVIDER: Sheryle Carwin, MD   END OF SESSION:  PT End of Session - 12/28/24 0733     Visit Number 12    Number of Visits 18    Date for Recertification  01/19/25    Authorization Type Medicare part A & B with [employer] secondary    Authorization Time Period no auth required    Progress Note Due on Visit 10    PT Start Time 0732    PT Stop Time 0813    PT Time Calculation (min) 41 min    Activity Tolerance Patient tolerated treatment well    Behavior During Therapy [facility] for tasks assessed/performed                Past Medical History:  Diagnosis Date   Aortic stenosis    Status post TAVR at [facility], November 2019   CAD (coronary artery disease)    DES to ostial RCA October 2019 at [facility]   Essential hypertension    Glaucoma    Guillain-Barre syndrome 06/2013   Treated at [facility]   History of nephrolithiasis    History of pneumonia    Hyperlipidemia    Patent ductus arteriosus    Surgically repaired at age 24   Prostate cancer (HCC)    Type 2 diabetes mellitus (HCC)    Past Surgical History:  Procedure Laterality Date   CARPAL TUNNEL RELEASE  2012   left   CARPAL TUNNEL RELEASE Right 06/12/2014   Procedure: RIGHT CARPAL TUNNEL RELEASE ;  Surgeon: Arley JONELLE Curia, MD;  Location: [facility];  Service: Orthopedics;  Laterality: Right;   CATARACT EXTRACTION W/PHACO Left 10/13/2016   Procedure: CATARACT EXTRACTION PHACO AND INTRAOCULAR LENS PLACEMENT LEFT EYE CDE=15.04;  Surgeon: Oneil Platts, MD;  Location: [facility];  Service: Ophthalmology;  Laterality: Left;  left   CATARACT EXTRACTION W/PHACO Right 11/03/2016   Procedure: CATARACT EXTRACTION PHACO AND INTRAOCULAR LENS PLACEMENT (IOC);  Surgeon: Oneil Platts, MD;  Location: [facility];  Service: Ophthalmology;  Laterality: Right;  CDE:  15.10   COLONOSCOPY     KNEE ARTHROSCOPY  1962   left   Patent ductus arteriosus repair     1951 - age 9   ULNAR NERVE TRANSPOSITION Right 06/12/2014   Procedure: DECOMPRESSION ULNAR NERVE RIGHT ELBOW;  Surgeon: Arley JONELLE Curia, MD;  Location: [facility];  Service: Orthopedics;  Laterality: Right;   VIDEO ASSISTED THORACOSCOPY (VATS)/THOROCOTOMY  8/14   left-guillian-barre   Patient Active Problem List   Diagnosis Date Noted   Coronary artery disease due to lipid rich plaque 12/22/2022   S/P TAVR (transcatheter aortic valve replacement) 11/02/2018   PAD (peripheral artery disease) 11/01/2018   CHF (congestive heart failure), NYHA class II, chronic, diastolic (HCC) 10/31/2018   Diabetes mellitus type 2, uncomplicated (HCC) 04/06/2017   Primary open angle glaucoma of both eyes, moderate stage 04/06/2017   Pseudophakia 04/06/2017   Lower extremity weakness 10/23/2013   Difficulty walking 10/23/2013   Guillain-Barre 07/13/2013   DVT of lower extremity (deep venous thrombosis) (HCC) 07/07/2013   Dyspnea 07/04/2013   Hyponatremia 07/04/2013   Diabetes (HCC) 07/03/2013   Hypertension 07/03/2013   Hyperlipidemia 07/03/2013   Lung mass 07/03/2013   Rash 07/03/2013    ONSET DATE: 6-8 months ago  REFERRING DIAG: gait disturbance  THERAPY DIAG:  History of falling  Muscle weakness (generalized)  Rationale for Evaluation and Treatment: Rehabilitation  SUBJECTIVE:                                                                                                                                                                                             SUBJECTIVE STATEMENT: Patient reports that he feels good today. He notes that he almost fell the other day, but he was able to catch himself. He feels that he has improved 30-40% with physical therapy. He follows up with his referring physician in February.   Eval: Patient feels that he has been walking around like I'm  drunk. He feels that it has been getting worse for the past 6-8 months. He did not have a mechanism of injury. He did have Guillan-Barre in 2014 and he never got his legs back after that. He fell backwards yesterday, but he thinks his heel hit something causing him to fall. He is not hurting from this fall. He has been able to get up without help after each of his falls. He had tried going to the [facility], but this did not seem to help. Pt accompanied by: self  PERTINENT HISTORY: CHF, HTN, type 2 diabetes, history of Guillain-Barre, glaucoma, and history  of cancer    PAIN:  Are you having pain? No  PRECAUTIONS: Fall  RED FLAGS: None   WEIGHT BEARING RESTRICTIONS: No  FALLS: Has patient fallen in last 6 months? Yes. Number of falls 3-4  LIVING ENVIRONMENT: Lives with: lives alone Lives in: House/apartment Stairs: Yes: Internal: 25 steps; can reach both and External: 6 steps; can reach both; step to pattern and avoids going upstairs inside home Has following equipment at home: Single point cane  PLOF: Independent  PATIENT GOALS: improved balance  OBJECTIVE:  Note: Objective measures were completed at Evaluation unless otherwise noted.  COGNITION: Overall cognitive status: Within functional limits for tasks assessed   SENSATION: Light touch: Impaired  and diminished sensation to light touch in bilateral lower extremities Patient reports that he has neuropathy in both legs from his hips down to his feet in both legs  COORDINATION: WFL  EDEMA:  No edema observed   MUSCLE TONE: WFL for activities assessed  POSTURE: forward head and flexed trunk   LOWER EXTREMITY ROM: WFL for activities assessed  LOWER EXTREMITY MMT:    MMT Right Eval Left Eval Right 12/28/24 Left 12/28/24  Hip flexion 4-/5 3+/5 4/5 4-/5  Hip extension      Hip abduction      Hip adduction 4+/5 4+/5 5/5 5/5  Hip internal  rotation      Hip external rotation      Knee flexion 4/5 4/5 4+/5 4/5  Knee  extension 4/5 3+/5 3+/5 3+/5  Ankle dorsiflexion 3/5 3/5 3/5 3/5  Ankle plantarflexion      Ankle inversion      Ankle eversion      (Blank rows = not tested)  TRANSFERS: Sit to stand: unable to complete without UE support from armrests  GAIT: Findings: Gait Characteristics: step through pattern, decreased stride length, decreased hip/knee flexion- Right, decreased hip/knee flexion- Left, Right foot flat, Left foot flat, poor foot clearance- Right, and poor foot clearance- Left, Assistive device utilized:Single point cane, Level of assistance: SBA, and Comments: patient utilized the [facility] in one hand and had the other hand on the wall for balance  FUNCTIONAL TESTS:  5 times sit to stand: 47.76 seconds; requires UE support from armrests  Timed up and go (TUG): 11/07/24: 41.28 seconds with cane 2 minute walk test: 12/2/2 MWT 159 ft with SPC DGI 11/23/24: 3/24 with SPC  PATIENT SURVEYS:  ABC scale: The Activities-Specific Balance Confidence (ABC) Scale 0% 10 20 30  40 50 60 70 80 90 100% No confidence<->completely confident  How confident are you that you will not lose your balance or become unsteady when you . . .   Date tested 10/31/24 12/19/24  Walk around the house 100% 85%  2. Walk up or down stairs 100% 100%  3. Bend over and pick up a slipper from in front of a closet floor 0% 20%  4. Reach for a small can off a shelf at eye level 100% 75%  5. Stand on tip toes and reach for something above your head 50% 80%  6. Stand on a chair and reach for something 0% 0%  7. Sweep the floor 0% 10%  8. Walk outside the house to a car parked in the driveway 100% 100%  9. Get into or out of a car 100% 100%  10. Walk across a parking lot to the mall 50% 75%  11. Walk up or down a ramp 25% 80%  12. Walk in a crowded mall where people rapidly walk past you 25% 50%  13. Are bumped into by people as you walk through the mall 50% 50%  14. Step onto or off of an escalator while you are holding onto  the railing 100% 100%  15. Step onto or off an escalator while holding onto parcels such that you cannot hold onto the railing 0% 0%  16. Walk outside on icy sidewalks 0% 0%  Total: #/16  50%   57.81%                                                                                                                                 TREATMENT DATE:  12/28/24 EXERCISE LOG  Exercise Repetitions and Resistance Comments  Nustep  L4 x 5 minutes    LE MMT   See objective    5x STS  42.62 seconds   LAQ  4# x 25 reps  Alternating LE   Seated march  4# x 25 reps  Alternating LE   Standing gastroc stretch   3 minutes    Static stance on foam  2 minutes  Frequent UE support from parallel bars  Backward stepping   2 minutes  Alternating LE; BUE support from parallel bars    Blank cell = exercise not performed today                                    12/26/24 EXERCISE LOG  Exercise Repetitions and Resistance Comments  Nustep  L4 x 5 minutes    Cone taps  2 minutes  3 cones; anterior only; BUE support from parallel bars   Rocker board (seated)  3 minutes    LAQ  4# x 25 reps each   Seated march  4# x 25 reps each    Mini squat   15 reps  BUE support from parallel bars   Side stepping  2 minutes  BUE support from parallel bars   Seated hip ADD isometric  3 minutes w/ 5 second hold     Blank cell = exercise not performed today                                    12/19/24 EXERCISE LOG  Exercise Repetitions and Resistance Comments  Nustep  L4 x 5 minutes    Goal assessment   See below    TUG   29.68 seconds with SPC    2 MWT  152 feet with SPC  CGA for safety   Patient education  See below     Blank cell = exercise not performed today   PATIENT EDUCATION: Education details: POC, progress with physical therapy, and HEP  Person educated: Patient Education method: Explanation Education comprehension: verbalized understanding  HOME EXERCISE PROGRAM: Access Code:  [email or web address] URL: [email or web address] Date: 10/31/2024 Prepared by: Lacinda Fass  Exercises - Seated March  - 1-2 x daily - 7 x weekly - 3 sets - 10 reps - Seated Long Arc Quad  - 1-2 x daily - 7 x weekly - 3 sets - 10 reps - Seated Heel Toe Raises  - 1-2 x daily - 7 x weekly - 3 sets - 10 reps  STS 3x10  added verbally 11/28/24 JAM   12/05/24: - Supine Bridge  - 1 x daily - 7 x weekly - 3 sets - 10 reps - 5 hold - Sit to Stand with Armchair  - 2 x daily - 7 x weekly - 1 sets - 10 reps GOALS: Goals reviewed with patient? Yes  SHORT TERM GOALS: Target date: 11/21/24  Patient will be independent with his initial HEP. Baseline: Goal status: MET  2.  Patient will be able to transfer from sitting to standing without upper extremity support for improved independence. Baseline: unable to clear seat without UE support (able to complete from elevated seat) Goal status: ON GOING  3.  Patient will improve his 5 times sit to stand time to 30 seconds or less for improved lower extremity power. Baseline: 12/19/24:  53.21 seconds with armrests  12/28/24: 42.62 seconds with armrests  Goal status: ON GOING  LONG TERM GOALS: Target date: 12/12/24  Patient will be independent with his advanced HEP. Baseline:  Goal status: ON GOING  2.  Patient will improve his 5 times sit to stand time to 20 seconds or less for improved functional mobility. Baseline: 12/19/24: 53.21 seconds with armrests 12/28/24: 42.62 seconds with armrests Goal status: ON GOING  3.  Patient will improve his ABC scale by at least 15% for improved perceived function with his daily activities. Baseline: 7% improvement on 12/19/24 Goal status: ON GOING  4.  Patient will be able to navigate at least 4 steps while utilizing only 1 railing for improved household mobility. Baseline:  Goal status: ON GOING  ASSESSMENT:  CLINICAL IMPRESSION: Patient continues to make fair progress with skilled physical therapy as  evidenced by his subjective reports, objective measures, and progress toward his goals. He was able to demonstrate a mild improvement in his bilateral lower extremity muscular strength since his initial evaluation. He was also able to achieve an 11 second improvement in his five time sit to stand test since his progress report on 12/19/24. He remains a high fall risk as evidenced by his objective measures, gait mechanics, and his multiple falls in recent weeks. Today's treatment focused on improved lower extremity strength and stability to maximize his functional mobility. He required cueing with today's standing interventions for improved upright posture to facilitate improved awareness of his surroundings. He reported feeling alright upon the conclusion of treatment. Recommend that he continue with skilled physical therapy for six additional visits to address his remaining impairments to maximize his safety and functional mobility.   Eval: Patient is a 89 y.o. male who was seen today for physical therapy evaluation and treatment for unsteadiness on his feet. He is a high fall risk as evidenced by his gait mechanics, objective and functional testing, and his history of falling. He also exhibited diminished sensation to light touch in both lower extremities. Recommend that he continue with skilled physical therapy to address his impairments to maximize his safety and functional mobility.    OBJECTIVE IMPAIRMENTS: Abnormal gait, decreased activity tolerance, decreased balance, decreased mobility, difficulty walking, and decreased strength.   ACTIVITY LIMITATIONS: standing, squatting, stairs, transfers, and locomotion level  PARTICIPATION LIMITATIONS: driving, shopping, community activity, and yard work  PERSONAL FACTORS: Age, Behavior pattern, Past/current experiences, Time since onset of injury/illness/exacerbation, and 3+ comorbidities: CHF, HTN, type 2 diabetes, history of Guillain-Barre, glaucoma, and  history  of cancer   are also affecting patient's functional outcome.   REHAB POTENTIAL: Fair    CLINICAL DECISION MAKING: Evolving/moderate complexity  EVALUATION COMPLEXITY: Moderate  PLAN:  PT FREQUENCY: 2x/week  PT DURATION: 6 weeks  PLANNED INTERVENTIONS: 97164- PT Re-evaluation, 97750- Physical Performance Testing, 97110-Therapeutic exercises, 97530- Therapeutic activity, 97112- Neuromuscular re-education, 97535- Self Care, [id number]- Manual therapy, [phone]- Gait training, Patient/Family education, Balance training, and Stair training  PLAN FOR NEXT SESSION: Encourage use of RW for daily activity, gait training, balance interventions, and lower extremity strengthening (utilize gait belt for standing interventions due to high fall risk) ankle mobility.  10th visit progress note next session.  Lacinda Fass, PT, DPT  8:34 AM, 12/28/2024  "

## 2025-01-02 ENCOUNTER — Encounter (HOSPITAL_COMMUNITY): Payer: Self-pay

## 2025-01-02 ENCOUNTER — Ambulatory Visit (HOSPITAL_COMMUNITY)

## 2025-01-02 DIAGNOSIS — Z9181 History of falling: Secondary | ICD-10-CM | POA: Diagnosis not present

## 2025-01-02 DIAGNOSIS — M6281 Muscle weakness (generalized): Secondary | ICD-10-CM

## 2025-01-02 NOTE — Therapy (Signed)
 " OUTPATIENT PHYSICAL THERAPY NEURO TREATMENT   Patient Name: [name] MRN: [id number] DOB:[birth year], 89 y.o., male Today's Date: 01/02/2025   PCP: Sheryle Carwin, MD  REFERRING PROVIDER: Sheryle Carwin, MD   END OF SESSION:  PT End of Session - 01/02/25 0734     Visit Number 13    Number of Visits 18    Date for Recertification  01/19/25    Authorization Type Medicare part A & B with [employer] secondary    Authorization Time Period no auth required    Progress Note Due on Visit 10    PT Start Time 0734    PT Stop Time 0815    PT Time Calculation (min) 41 min    Activity Tolerance Patient tolerated treatment well    Behavior During Therapy [facility] for tasks assessed/performed                 Past Medical History:  Diagnosis Date   Aortic stenosis    Status post TAVR at [facility], November 2019   CAD (coronary artery disease)    DES to ostial RCA October 2019 at [facility]   Essential hypertension    Glaucoma    Guillain-Barre syndrome 06/2013   Treated at [facility]   History of nephrolithiasis    History of pneumonia    Hyperlipidemia    Patent ductus arteriosus    Surgically repaired at age 6   Prostate cancer (HCC)    Type 2 diabetes mellitus (HCC)    Past Surgical History:  Procedure Laterality Date   CARPAL TUNNEL RELEASE  2012   left   CARPAL TUNNEL RELEASE Right 06/12/2014   Procedure: RIGHT CARPAL TUNNEL RELEASE ;  Surgeon: Arley JONELLE Curia, MD;  Location: [facility];  Service: Orthopedics;  Laterality: Right;   CATARACT EXTRACTION W/PHACO Left 10/13/2016   Procedure: CATARACT EXTRACTION PHACO AND INTRAOCULAR LENS PLACEMENT LEFT EYE CDE=15.04;  Surgeon: Oneil Platts, MD;  Location: [facility];  Service: Ophthalmology;  Laterality: Left;  left   CATARACT EXTRACTION W/PHACO Right 11/03/2016   Procedure: CATARACT EXTRACTION PHACO AND INTRAOCULAR LENS PLACEMENT (IOC);  Surgeon: Oneil Platts, MD;  Location: [facility];  Service: Ophthalmology;  Laterality: Right;  CDE:  15.10   COLONOSCOPY     KNEE ARTHROSCOPY  1962   left   Patent ductus arteriosus repair     1951 - age 55   ULNAR NERVE TRANSPOSITION Right 06/12/2014   Procedure: DECOMPRESSION ULNAR NERVE RIGHT ELBOW;  Surgeon: Arley JONELLE Curia, MD;  Location: [facility];  Service: Orthopedics;  Laterality: Right;   VIDEO ASSISTED THORACOSCOPY (VATS)/THOROCOTOMY  8/14   left-guillian-barre   Patient Active Problem List   Diagnosis Date Noted   Coronary artery disease due to lipid rich plaque 12/22/2022   S/P TAVR (transcatheter aortic valve replacement) 11/02/2018   PAD (peripheral artery disease) 11/01/2018   CHF (congestive heart failure), NYHA class II, chronic, diastolic (HCC) 10/31/2018   Diabetes mellitus type 2, uncomplicated (HCC) 04/06/2017   Primary open angle glaucoma of both eyes, moderate stage 04/06/2017   Pseudophakia 04/06/2017   Lower extremity weakness 10/23/2013   Difficulty walking 10/23/2013   Guillain-Barre 07/13/2013   DVT of lower extremity (deep venous thrombosis) (HCC) 07/07/2013   Dyspnea 07/04/2013   Hyponatremia 07/04/2013   Diabetes (HCC) 07/03/2013   Hypertension 07/03/2013   Hyperlipidemia 07/03/2013   Lung mass 07/03/2013   Rash 07/03/2013    ONSET DATE: 6-8 months ago  REFERRING DIAG: gait disturbance  THERAPY DIAG:  History of falling  Muscle weakness (generalized)  Rationale for Evaluation and Treatment: Rehabilitation  SUBJECTIVE:                                                                                                                                                                                             SUBJECTIVE STATEMENT: Patient reports that he fell on Thursday (12/28/24). He was walking when the rubber on the toe of his shoes folded back and caused him to trip and fall. He tore up his arm pretty bad. He has a band-aid on his left wrist and forearm due to this fall.   Eval: Patient feels that he has been walking around  like I'm drunk. He feels that it has been getting worse for the past 6-8 months. He did not have a mechanism of injury. He did have Guillan-Barre in 2014 and he never got his legs back after that. He fell backwards yesterday, but he thinks his heel hit something causing him to fall. He is not hurting from this fall. He has been able to get up without help after each of his falls. He had tried going to the [facility], but this did not seem to help. Pt accompanied by: self  PERTINENT HISTORY: CHF, HTN, type 2 diabetes, history of Guillain-Barre, glaucoma, and history  of cancer    PAIN:  Are you having pain? No  PRECAUTIONS: Fall  RED FLAGS: None   WEIGHT BEARING RESTRICTIONS: No  FALLS: Has patient fallen in last 6 months? Yes. Number of falls 3-4  LIVING ENVIRONMENT: Lives with: lives alone Lives in: House/apartment Stairs: Yes: Internal: 25 steps; can reach both and External: 6 steps; can reach both; step to pattern and avoids going upstairs inside home Has following equipment at home: Single point cane  PLOF: Independent  PATIENT GOALS: improved balance  OBJECTIVE:  Note: Objective measures were completed at Evaluation unless otherwise noted.  COGNITION: Overall cognitive status: Within functional limits for tasks assessed   SENSATION: Light touch: Impaired  and diminished sensation to light touch in bilateral lower extremities Patient reports that he has neuropathy in both legs from his hips down to his feet in both legs  COORDINATION: WFL  EDEMA:  No edema observed   MUSCLE TONE: WFL for activities assessed  POSTURE: forward head and flexed trunk   LOWER EXTREMITY ROM: WFL for activities assessed  LOWER EXTREMITY MMT:    MMT Right Eval Left Eval Right 12/28/24 Left 12/28/24  Hip flexion 4-/5 3+/5 4/5 4-/5  Hip extension      Hip abduction      Hip  adduction 4+/5 4+/5 5/5 5/5  Hip internal rotation      Hip external rotation      Knee flexion 4/5 4/5 4+/5 4/5   Knee extension 4/5 3+/5 3+/5 3+/5  Ankle dorsiflexion 3/5 3/5 3/5 3/5  Ankle plantarflexion      Ankle inversion      Ankle eversion      (Blank rows = not tested)  TRANSFERS: Sit to stand: unable to complete without UE support from armrests  GAIT: Findings: Gait Characteristics: step through pattern, decreased stride length, decreased hip/knee flexion- Right, decreased hip/knee flexion- Left, Right foot flat, Left foot flat, poor foot clearance- Right, and poor foot clearance- Left, Assistive device utilized:Single point cane, Level of assistance: SBA, and Comments: patient utilized the [facility] in one hand and had the other hand on the wall for balance  FUNCTIONAL TESTS:  5 times sit to stand: 47.76 seconds; requires UE support from armrests  Timed up and go (TUG): 11/07/24: 41.28 seconds with cane 2 minute walk test: 12/2/2 MWT 159 ft with SPC DGI 11/23/24: 3/24 with SPC  PATIENT SURVEYS:  ABC scale: The Activities-Specific Balance Confidence (ABC) Scale 0% 10 20 30  40 50 60 70 80 90 100% No confidence<->completely confident  How confident are you that you will not lose your balance or become unsteady when you . . .   Date tested 10/31/24 12/19/24  Walk around the house 100% 85%  2. Walk up or down stairs 100% 100%  3. Bend over and pick up a slipper from in front of a closet floor 0% 20%  4. Reach for a small can off a shelf at eye level 100% 75%  5. Stand on tip toes and reach for something above your head 50% 80%  6. Stand on a chair and reach for something 0% 0%  7. Sweep the floor 0% 10%  8. Walk outside the house to a car parked in the driveway 100% 100%  9. Get into or out of a car 100% 100%  10. Walk across a parking lot to the mall 50% 75%  11. Walk up or down a ramp 25% 80%  12. Walk in a crowded mall where people rapidly walk past you 25% 50%  13. Are bumped into by people as you walk through the mall 50% 50%  14. Step onto or off of an escalator while you are  holding onto the railing 100% 100%  15. Step onto or off an escalator while holding onto parcels such that you cannot hold onto the railing 0% 0%  16. Walk outside on icy sidewalks 0% 0%  Total: #/16  50%   57.81%                                                                                                                                 TREATMENT DATE:  01/02/25 EXERCISE LOG  Exercise Repetitions and Resistance Comments  Nustep  L4 x 5 minutes    LAQ  4# x 30 reps each  Alternating LE   Seated march   4# x 30 reps  Alternating LE   Toe tap onto step  12 x 2 minutes  Alternating LE; BUE support from parallel bars   Standing hip ABD  2 minutes  Alternating LE; BUE support from parallel bars   Sit to stand  9 reps  With BUE support from armrests   Static stance on foam 2 minutes  Frequent UE support from parallel bars   Standing HS curl  15 reps each  Cramp on RLE initially, but resolved quickly   Seated hip ADD isometric  3 minutes w/ 5 second hold     Blank cell = exercise not performed today                                    12/28/24 EXERCISE LOG  Exercise Repetitions and Resistance Comments  Nustep  L4 x 5 minutes    LE MMT   See objective    5x STS  42.62 seconds   LAQ  4# x 25 reps  Alternating LE   Seated march  4# x 25 reps  Alternating LE   Standing gastroc stretch   3 minutes    Static stance on foam  2 minutes  Frequent UE support from parallel bars  Backward stepping   2 minutes  Alternating LE; BUE support from parallel bars    Blank cell = exercise not performed today                                    12/26/24 EXERCISE LOG  Exercise Repetitions and Resistance Comments  Nustep  L4 x 5 minutes    Cone taps  2 minutes  3 cones; anterior only; BUE support from parallel bars   Rocker board (seated)  3 minutes    LAQ  4# x 25 reps each   Seated march  4# x 25 reps each    Mini squat   15 reps  BUE support from parallel bars   Side  stepping  2 minutes  BUE support from parallel bars   Seated hip ADD isometric  3 minutes w/ 5 second hold     Blank cell = exercise not performed today    PATIENT EDUCATION: Education details: POC, progress with physical therapy, and HEP  Person educated: Patient Education method: Explanation Education comprehension: verbalized understanding  HOME EXERCISE PROGRAM: Access Code: [email or web address] URL: [email or web address] Date: 10/31/2024 Prepared by: Lacinda Fass  Exercises - Seated March  - 1-2 x daily - 7 x weekly - 3 sets - 10 reps - Seated Long Arc Quad  - 1-2 x daily - 7 x weekly - 3 sets - 10 reps - Seated Heel Toe Raises  - 1-2 x daily - 7 x weekly - 3 sets - 10 reps  STS 3x10  added verbally 11/28/24 JAM   12/05/24: - Supine Bridge  - 1 x daily - 7 x weekly - 3 sets - 10 reps - 5 hold - Sit to Stand with Armchair  - 2 x daily - 7 x weekly - 1 sets - 10 reps GOALS: Goals reviewed with patient? Yes  SHORT  TERM GOALS: Target date: 11/21/24  Patient will be independent with his initial HEP. Baseline: Goal status: MET  2.  Patient will be able to transfer from sitting to standing without upper extremity support for improved independence. Baseline: unable to clear seat without UE support (able to complete from elevated seat) Goal status: ON GOING  3.  Patient will improve his 5 times sit to stand time to 30 seconds or less for improved lower extremity power. Baseline: 12/19/24: 53.21 seconds with armrests  12/28/24: 42.62 seconds with armrests  Goal status: ON GOING  LONG TERM GOALS: Target date: 12/12/24  Patient will be independent with his advanced HEP. Baseline:  Goal status: ON GOING  2.  Patient will improve his 5 times sit to stand time to 20 seconds or less for improved functional mobility. Baseline: 12/19/24: 53.21 seconds with armrests 12/28/24: 42.62 seconds with armrests Goal status: ON GOING  3.  Patient will improve his ABC scale by at least  15% for improved perceived function with his daily activities. Baseline: 7% improvement on 12/19/24 Goal status: ON GOING  4.  Patient will be able to navigate at least 4 steps while utilizing only 1 railing for improved household mobility. Baseline:  Goal status: ON GOING  ASSESSMENT:  CLINICAL IMPRESSION: Patient presented to treatment reporting a fall on 12/28/24. Today's treatment focused on familiar interventions for improved lower extremity strength and stability with moderate difficulty and fatigue. He required multiple seated rest breaks throughout treatment secondary to fatigue. He also required frequent upper extremity support from the parallel bars with static stance onto foam to prevent a loss of balance. This was primarily due to increased anterior and posterior sway. He reported feeling alright upon the conclusion of treatment. Patient continues to require skilled physical therapy to address her remaining impairments to maximize his safety and functional mobility.  Eval: Patient is a 89 y.o. male who was seen today for physical therapy evaluation and treatment for unsteadiness on his feet. He is a high fall risk as evidenced by his gait mechanics, objective and functional testing, and his history of falling. He also exhibited diminished sensation to light touch in both lower extremities. Recommend that he continue with skilled physical therapy to address his impairments to maximize his safety and functional mobility.    OBJECTIVE IMPAIRMENTS: Abnormal gait, decreased activity tolerance, decreased balance, decreased mobility, difficulty walking, and decreased strength.   ACTIVITY LIMITATIONS: standing, squatting, stairs, transfers, and locomotion level  PARTICIPATION LIMITATIONS: driving, shopping, community activity, and yard work  PERSONAL FACTORS: Age, Behavior pattern, Past/current experiences, Time since onset of injury/illness/exacerbation, and 3+ comorbidities: CHF, HTN, type 2  diabetes, history of Guillain-Barre, glaucoma, and history  of cancer   are also affecting patient's functional outcome.   REHAB POTENTIAL: Fair    CLINICAL DECISION MAKING: Evolving/moderate complexity  EVALUATION COMPLEXITY: Moderate  PLAN:  PT FREQUENCY: 2x/week  PT DURATION: 6 weeks  PLANNED INTERVENTIONS: 97164- PT Re-evaluation, 97750- Physical Performance Testing, 97110-Therapeutic exercises, 97530- Therapeutic activity, 97112- Neuromuscular re-education, 97535- Self Care, [id number]- Manual therapy, [phone]- Gait training, Patient/Family education, Balance training, and Stair training  PLAN FOR NEXT SESSION: Encourage use of RW for daily activity, gait training, balance interventions, and lower extremity strengthening (utilize gait belt for standing interventions due to high fall risk) ankle mobility.    Lacinda Fass, PT, DPT  9:19 AM, 01/02/2025  "

## 2025-01-04 ENCOUNTER — Encounter (HOSPITAL_COMMUNITY): Payer: Self-pay

## 2025-01-04 ENCOUNTER — Ambulatory Visit (HOSPITAL_COMMUNITY)

## 2025-01-04 DIAGNOSIS — Z9181 History of falling: Secondary | ICD-10-CM | POA: Diagnosis not present

## 2025-01-04 DIAGNOSIS — M6281 Muscle weakness (generalized): Secondary | ICD-10-CM

## 2025-01-04 NOTE — Therapy (Signed)
 " OUTPATIENT PHYSICAL THERAPY NEURO TREATMENT   Patient Name: [name] MRN: [id number] DOB:[birth year], 89 y.o., male Today's Date: 01/04/2025   PCP: Sheryle Carwin, MD  REFERRING PROVIDER: Sheryle Carwin, MD   END OF SESSION:  PT End of Session - 01/04/25 0732     Visit Number 14    Number of Visits 18    Date for Recertification  01/19/25    Authorization Type Medicare part A & B with [employer] secondary    Authorization Time Period no auth required    Progress Note Due on Visit 10    PT Start Time 0732    PT Stop Time 0815    PT Time Calculation (min) 43 min    Activity Tolerance Patient tolerated treatment well    Behavior During Therapy [facility] for tasks assessed/performed                  Past Medical History:  Diagnosis Date   Aortic stenosis    Status post TAVR at [facility], November 2019   CAD (coronary artery disease)    DES to ostial RCA October 2019 at [facility]   Essential hypertension    Glaucoma    Guillain-Barre syndrome 06/2013   Treated at [facility]   History of nephrolithiasis    History of pneumonia    Hyperlipidemia    Patent ductus arteriosus    Surgically repaired at age 40   Prostate cancer (HCC)    Type 2 diabetes mellitus (HCC)    Past Surgical History:  Procedure Laterality Date   CARPAL TUNNEL RELEASE  2012   left   CARPAL TUNNEL RELEASE Right 06/12/2014   Procedure: RIGHT CARPAL TUNNEL RELEASE ;  Surgeon: Arley JONELLE Curia, MD;  Location: [facility];  Service: Orthopedics;  Laterality: Right;   CATARACT EXTRACTION W/PHACO Left 10/13/2016   Procedure: CATARACT EXTRACTION PHACO AND INTRAOCULAR LENS PLACEMENT LEFT EYE CDE=15.04;  Surgeon: Oneil Platts, MD;  Location: [facility];  Service: Ophthalmology;  Laterality: Left;  left   CATARACT EXTRACTION W/PHACO Right 11/03/2016   Procedure: CATARACT EXTRACTION PHACO AND INTRAOCULAR LENS PLACEMENT (IOC);  Surgeon: Oneil Platts, MD;  Location: [facility];  Service: Ophthalmology;  Laterality: Right;   CDE: 15.10   COLONOSCOPY     KNEE ARTHROSCOPY  1962   left   Patent ductus arteriosus repair     1951 - age 89   ULNAR NERVE TRANSPOSITION Right 06/12/2014   Procedure: DECOMPRESSION ULNAR NERVE RIGHT ELBOW;  Surgeon: Arley JONELLE Curia, MD;  Location: [facility];  Service: Orthopedics;  Laterality: Right;   VIDEO ASSISTED THORACOSCOPY (VATS)/THOROCOTOMY  8/14   left-guillian-barre   Patient Active Problem List   Diagnosis Date Noted   Coronary artery disease due to lipid rich plaque 12/22/2022   S/P TAVR (transcatheter aortic valve replacement) 11/02/2018   PAD (peripheral artery disease) 11/01/2018   CHF (congestive heart failure), NYHA class II, chronic, diastolic (HCC) 10/31/2018   Diabetes mellitus type 2, uncomplicated (HCC) 04/06/2017   Primary open angle glaucoma of both eyes, moderate stage 04/06/2017   Pseudophakia 04/06/2017   Lower extremity weakness 10/23/2013   Difficulty walking 10/23/2013   Guillain-Barre 07/13/2013   DVT of lower extremity (deep venous thrombosis) (HCC) 07/07/2013   Dyspnea 07/04/2013   Hyponatremia 07/04/2013   Diabetes (HCC) 07/03/2013   Hypertension 07/03/2013   Hyperlipidemia 07/03/2013   Lung mass 07/03/2013   Rash 07/03/2013    ONSET DATE: 6-8 months ago  REFERRING DIAG: gait  disturbance   THERAPY DIAG:  History of falling  Muscle weakness (generalized)  Rationale for Evaluation and Treatment: Rehabilitation  SUBJECTIVE:                                                                                                                                                                                             SUBJECTIVE STATEMENT: Patient reports feeling good today.   Eval: Patient feels that he has been walking around like I'm drunk. He feels that it has been getting worse for the past 6-8 months. He did not have a mechanism of injury. He did have Guillan-Barre in 2014 and he never got his legs back after that. He fell  backwards yesterday, but he thinks his heel hit something causing him to fall. He is not hurting from this fall. He has been able to get up without help after each of his falls. He had tried going to the [facility], but this did not seem to help. Pt accompanied by: self  PERTINENT HISTORY: CHF, HTN, type 2 diabetes, history of Guillain-Barre, glaucoma, and history  of cancer    PAIN:  Are you having pain? No  PRECAUTIONS: Fall  RED FLAGS: None   WEIGHT BEARING RESTRICTIONS: No  FALLS: Has patient fallen in last 6 months? Yes. Number of falls 3-4  LIVING ENVIRONMENT: Lives with: lives alone Lives in: House/apartment Stairs: Yes: Internal: 25 steps; can reach both and External: 6 steps; can reach both; step to pattern and avoids going upstairs inside home Has following equipment at home: Single point cane  PLOF: Independent  PATIENT GOALS: improved balance  OBJECTIVE:  Note: Objective measures were completed at Evaluation unless otherwise noted.  COGNITION: Overall cognitive status: Within functional limits for tasks assessed   SENSATION: Light touch: Impaired  and diminished sensation to light touch in bilateral lower extremities Patient reports that he has neuropathy in both legs from his hips down to his feet in both legs  COORDINATION: WFL  EDEMA:  No edema observed   MUSCLE TONE: WFL for activities assessed  POSTURE: forward head and flexed trunk   LOWER EXTREMITY ROM: WFL for activities assessed  LOWER EXTREMITY MMT:    MMT Right Eval Left Eval Right 12/28/24 Left 12/28/24  Hip flexion 4-/5 3+/5 4/5 4-/5  Hip extension      Hip abduction      Hip adduction 4+/5 4+/5 5/5 5/5  Hip internal rotation      Hip external rotation      Knee flexion 4/5 4/5 4+/5 4/5  Knee extension 4/5 3+/5 3+/5 3+/5  Ankle dorsiflexion 3/5 3/5 3/5 3/5  Ankle plantarflexion      Ankle inversion      Ankle eversion      (Blank rows = not tested)  TRANSFERS: Sit to stand: unable to  complete without UE support from armrests  GAIT: Findings: Gait Characteristics: step through pattern, decreased stride length, decreased hip/knee flexion- Right, decreased hip/knee flexion- Left, Right foot flat, Left foot flat, poor foot clearance- Right, and poor foot clearance- Left, Assistive device utilized:Single point cane, Level of assistance: SBA, and Comments: patient utilized the [facility] in one hand and had the other hand on the wall for balance  FUNCTIONAL TESTS:  5 times sit to stand: 47.76 seconds; requires UE support from armrests  Timed up and go (TUG): 11/07/24: 41.28 seconds with cane 2 minute walk test: 12/2/2 MWT 159 ft with SPC DGI 11/23/24: 3/24 with SPC  PATIENT SURVEYS:  ABC scale: The Activities-Specific Balance Confidence (ABC) Scale 0% 10 20 30  40 50 60 70 80 90 100% No confidence<->completely confident  How confident are you that you will not lose your balance or become unsteady when you . . .   Date tested 10/31/24 12/19/24  Walk around the house 100% 85%  2. Walk up or down stairs 100% 100%  3. Bend over and pick up a slipper from in front of a closet floor 0% 20%  4. Reach for a small can off a shelf at eye level 100% 75%  5. Stand on tip toes and reach for something above your head 50% 80%  6. Stand on a chair and reach for something 0% 0%  7. Sweep the floor 0% 10%  8. Walk outside the house to a car parked in the driveway 100% 100%  9. Get into or out of a car 100% 100%  10. Walk across a parking lot to the mall 50% 75%  11. Walk up or down a ramp 25% 80%  12. Walk in a crowded mall where people rapidly walk past you 25% 50%  13. Are bumped into by people as you walk through the mall 50% 50%  14. Step onto or off of an escalator while you are holding onto the railing 100% 100%  15. Step onto or off an escalator while holding onto parcels such that you cannot hold onto the railing 0% 0%  16. Walk outside on icy sidewalks 0% 0%  Total: #/16  50%   57.81%                                                                                                                                  TREATMENT DATE:                                     01/04/25 EXERCISE LOG  Exercise Repetitions and Resistance Comments  Nustep  L4 x 5 minutes  Seated clams  GTB x 30 reps    Standing cone taps   2  minutes  Lateral; BUE support from parallel bars   Sit to stand   10 reps  From elevated seat; intermittent UE support for initiating transfer  LAQ 5# x 20 reps each  Alternating LE   Seated march  5# x 20 reps each  Alternating LE   Standing ball roll out  2 minutes  For improved stability reaching outside his BOS   Blank cell = exercise not performed today                                    01/02/25 EXERCISE LOG  Exercise Repetitions and Resistance Comments  Nustep  L4 x 5 minutes    LAQ  4# x 30 reps each  Alternating LE   Seated march   4# x 30 reps  Alternating LE   Toe tap onto step  12 x 2 minutes  Alternating LE; BUE support from parallel bars   Standing hip ABD  2 minutes  Alternating LE; BUE support from parallel bars   Sit to stand  9 reps  With BUE support from armrests   Static stance on foam 2 minutes  Frequent UE support from parallel bars   Standing HS curl  15 reps each  Cramp on RLE initially, but resolved quickly   Seated hip ADD isometric  3 minutes w/ 5 second hold     Blank cell = exercise not performed today                                    12/28/24 EXERCISE LOG  Exercise Repetitions and Resistance Comments  Nustep  L4 x 5 minutes    LE MMT   See objective    5x STS  42.62 seconds   LAQ  4# x 25 reps  Alternating LE   Seated march  4# x 25 reps  Alternating LE   Standing gastroc stretch   3 minutes    Static stance on foam  2 minutes  Frequent UE support from parallel bars  Backward stepping   2 minutes  Alternating LE; BUE support from parallel bars    Blank cell = exercise not performed today   PATIENT EDUCATION: Education  details: POC, progress with physical therapy, and HEP  Person educated: Patient Education method: Explanation Education comprehension: verbalized understanding  HOME EXERCISE PROGRAM: Access Code: [email or web address] URL: [email or web address] Date: 10/31/2024 Prepared by: Lacinda Fass  Exercises - Seated March  - 1-2 x daily - 7 x weekly - 3 sets - 10 reps - Seated Long Arc Quad  - 1-2 x daily - 7 x weekly - 3 sets - 10 reps - Seated Heel Toe Raises  - 1-2 x daily - 7 x weekly - 3 sets - 10 reps  STS 3x10  added verbally 11/28/24 JAM   12/05/24: - Supine Bridge  - 1 x daily - 7 x weekly - 3 sets - 10 reps - 5 hold - Sit to Stand with Armchair  - 2 x daily - 7 x weekly - 1 sets - 10 reps GOALS: Goals reviewed with patient? Yes  SHORT TERM GOALS: Target date: 11/21/24  Patient will be independent with his initial HEP. Baseline: Goal status: MET  2.  Patient  will be able to transfer from sitting to standing without upper extremity support for improved independence. Baseline: unable to clear seat without UE support (able to complete from elevated seat) Goal status: ON GOING  3.  Patient will improve his 5 times sit to stand time to 30 seconds or less for improved lower extremity power. Baseline: 12/19/24: 53.21 seconds with armrests  12/28/24: 42.62 seconds with armrests  Goal status: ON GOING  LONG TERM GOALS: Target date: 12/12/24  Patient will be independent with his advanced HEP. Baseline:  Goal status: ON GOING  2.  Patient will improve his 5 times sit to stand time to 20 seconds or less for improved functional mobility. Baseline: 12/19/24: 53.21 seconds with armrests 12/28/24: 42.62 seconds with armrests Goal status: ON GOING  3.  Patient will improve his ABC scale by at least 15% for improved perceived function with his daily activities. Baseline: 7% improvement on 12/19/24 Goal status: ON GOING  4.  Patient will be able to navigate at least 4 steps while  utilizing only 1 railing for improved household mobility. Baseline:  Goal status: ON GOING  ASSESSMENT:  CLINICAL IMPRESSION: Patient was introduced to standing ball roll outs for improved stability reaching outside of his base of support. He required minimal cueing with this new interventions for proper positioning to prevent from using the table to block his lower extremities. He required brief rest breaks treatment due to increased fatigue. He reported feeling good upon the conclusion of treatment.  Patient continues to require skilled physical therapy to address his remaining impairments to maximize his safety and functional mobility.  Eval: Patient is a 89 y.o. male who was seen today for physical therapy evaluation and treatment for unsteadiness on his feet. He is a high fall risk as evidenced by his gait mechanics, objective and functional testing, and his history of falling. He also exhibited diminished sensation to light touch in both lower extremities. Recommend that he continue with skilled physical therapy to address his impairments to maximize his safety and functional mobility.    OBJECTIVE IMPAIRMENTS: Abnormal gait, decreased activity tolerance, decreased balance, decreased mobility, difficulty walking, and decreased strength.   ACTIVITY LIMITATIONS: standing, squatting, stairs, transfers, and locomotion level  PARTICIPATION LIMITATIONS: driving, shopping, community activity, and yard work  PERSONAL FACTORS: Age, Behavior pattern, Past/current experiences, Time since onset of injury/illness/exacerbation, and 3+ comorbidities: CHF, HTN, type 2 diabetes, history of Guillain-Barre, glaucoma, and history  of cancer   are also affecting patient's functional outcome.   REHAB POTENTIAL: Fair    CLINICAL DECISION MAKING: Evolving/moderate complexity  EVALUATION COMPLEXITY: Moderate  PLAN:  PT FREQUENCY: 2x/week  PT DURATION: 6 weeks  PLANNED INTERVENTIONS: 97164- PT  Re-evaluation, 97750- Physical Performance Testing, 97110-Therapeutic exercises, 97530- Therapeutic activity, 97112- Neuromuscular re-education, 97535- Self Care, [id number]- Manual therapy, [phone]- Gait training, Patient/Family education, Balance training, and Stair training  PLAN FOR NEXT SESSION: Encourage use of RW for daily activity, gait training, balance interventions, and lower extremity strengthening (utilize gait belt for standing interventions due to high fall risk) ankle mobility.    Lacinda Fass, PT, DPT  10:07 AM, 01/04/25  "

## 2025-01-09 ENCOUNTER — Ambulatory Visit (HOSPITAL_COMMUNITY)

## 2025-01-09 ENCOUNTER — Encounter (HOSPITAL_COMMUNITY): Payer: Self-pay

## 2025-01-09 DIAGNOSIS — Z9181 History of falling: Secondary | ICD-10-CM | POA: Diagnosis not present

## 2025-01-09 DIAGNOSIS — M6281 Muscle weakness (generalized): Secondary | ICD-10-CM

## 2025-01-09 NOTE — Therapy (Signed)
 " OUTPATIENT PHYSICAL THERAPY NEURO TREATMENT   Patient Name: [name] MRN: [id number] DOB:09/24/1929, 89 y.o., male Today's Date: 01/09/2025   PCP: Sheryle Carwin, MD  REFERRING PROVIDER: Sheryle Carwin, MD   END OF SESSION:  PT End of Session - 01/09/25 0730     Visit Number 15    Number of Visits 18    Date for Recertification  01/19/25    Authorization Type Medicare part A & B with [employer] secondary    Authorization Time Period no auth required    Progress Note Due on Visit 10    PT Start Time 0730    PT Stop Time 0813    PT Time Calculation (min) 43 min    Activity Tolerance Patient tolerated treatment well    Behavior During Therapy [facility] for tasks assessed/performed                   Past Medical History:  Diagnosis Date   Aortic stenosis    Status post TAVR at [facility], November 2019   CAD (coronary artery disease)    DES to ostial RCA October 2019 at [facility]   Essential hypertension    Glaucoma    Guillain-Barre syndrome 06/2013   Treated at [facility]   History of nephrolithiasis    History of pneumonia    Hyperlipidemia    Patent ductus arteriosus    Surgically repaired at age 66   Prostate cancer (HCC)    Type 2 diabetes mellitus (HCC)    Past Surgical History:  Procedure Laterality Date   CARPAL TUNNEL RELEASE  2012   left   CARPAL TUNNEL RELEASE Right 06/12/2014   Procedure: RIGHT CARPAL TUNNEL RELEASE ;  Surgeon: Arley JONELLE Curia, MD;  Location: [facility];  Service: Orthopedics;  Laterality: Right;   CATARACT EXTRACTION W/PHACO Left 10/13/2016   Procedure: CATARACT EXTRACTION PHACO AND INTRAOCULAR LENS PLACEMENT LEFT EYE CDE=15.04;  Surgeon: Oneil Platts, MD;  Location: [facility];  Service: Ophthalmology;  Laterality: Left;  left   CATARACT EXTRACTION W/PHACO Right 11/03/2016   Procedure: CATARACT EXTRACTION PHACO AND INTRAOCULAR LENS PLACEMENT (IOC);  Surgeon: Oneil Platts, MD;  Location: [facility];  Service: Ophthalmology;  Laterality: Right;   CDE: 15.10   COLONOSCOPY     KNEE ARTHROSCOPY  1962   left   Patent ductus arteriosus repair     1951 - age 59   ULNAR NERVE TRANSPOSITION Right 06/12/2014   Procedure: DECOMPRESSION ULNAR NERVE RIGHT ELBOW;  Surgeon: Arley JONELLE Curia, MD;  Location: [facility];  Service: Orthopedics;  Laterality: Right;   VIDEO ASSISTED THORACOSCOPY (VATS)/THOROCOTOMY  8/14   left-guillian-barre   Patient Active Problem List   Diagnosis Date Noted   Coronary artery disease due to lipid rich plaque 12/22/2022   S/P TAVR (transcatheter aortic valve replacement) 11/02/2018   PAD (peripheral artery disease) 11/01/2018   CHF (congestive heart failure), NYHA class II, chronic, diastolic (HCC) 10/31/2018   Diabetes mellitus type 2, uncomplicated (HCC) 04/06/2017   Primary open angle glaucoma of both eyes, moderate stage 04/06/2017   Pseudophakia 04/06/2017   Lower extremity weakness 10/23/2013   Difficulty walking 10/23/2013   Guillain-Barre 07/13/2013   DVT of lower extremity (deep venous thrombosis) (HCC) 07/07/2013   Dyspnea 07/04/2013   Hyponatremia 07/04/2013   Diabetes (HCC) 07/03/2013   Hypertension 07/03/2013   Hyperlipidemia 07/03/2013   Lung mass 07/03/2013   Rash 07/03/2013    ONSET DATE: 6-8 months ago  REFERRING DIAG:  gait disturbance   THERAPY DIAG:  History of falling  Muscle weakness (generalized)  Rationale for Evaluation and Treatment: Rehabilitation  SUBJECTIVE:                                                                                                                                                                                             SUBJECTIVE STATEMENT: Patient reports that he feels chipper this morning. He has not fallen any since his last appointment.   Eval: Patient feels that he has been walking around like I'm drunk. He feels that it has been getting worse for the past 6-8 months. He did not have a mechanism of injury. He did have  Guillan-Barre in 2014 and he never got his legs back after that. He fell backwards yesterday, but he thinks his heel hit something causing him to fall. He is not hurting from this fall. He has been able to get up without help after each of his falls. He had tried going to the [facility], but this did not seem to help. Pt accompanied by: self  PERTINENT HISTORY: CHF, HTN, type 2 diabetes, history of Guillain-Barre, glaucoma, and history  of cancer    PAIN:  Are you having pain? No  PRECAUTIONS: Fall  RED FLAGS: None   WEIGHT BEARING RESTRICTIONS: No  FALLS: Has patient fallen in last 6 months? Yes. Number of falls 3-4  LIVING ENVIRONMENT: Lives with: lives alone Lives in: House/apartment Stairs: Yes: Internal: 25 steps; can reach both and External: 6 steps; can reach both; step to pattern and avoids going upstairs inside home Has following equipment at home: Single point cane  PLOF: Independent  PATIENT GOALS: improved balance  OBJECTIVE:  Note: Objective measures were completed at Evaluation unless otherwise noted.  COGNITION: Overall cognitive status: Within functional limits for tasks assessed   SENSATION: Light touch: Impaired  and diminished sensation to light touch in bilateral lower extremities Patient reports that he has neuropathy in both legs from his hips down to his feet in both legs  COORDINATION: WFL  EDEMA:  No edema observed   MUSCLE TONE: WFL for activities assessed  POSTURE: forward head and flexed trunk   LOWER EXTREMITY ROM: WFL for activities assessed  LOWER EXTREMITY MMT:    MMT Right Eval Left Eval Right 12/28/24 Left 12/28/24  Hip flexion 4-/5 3+/5 4/5 4-/5  Hip extension      Hip abduction      Hip adduction 4+/5 4+/5 5/5 5/5  Hip internal rotation      Hip external rotation      Knee flexion 4/5 4/5 4+/5 4/5  Knee extension 4/5 3+/5 3+/5 3+/5  Ankle dorsiflexion 3/5 3/5 3/5 3/5  Ankle plantarflexion      Ankle inversion      Ankle  eversion      (Blank rows = not tested)  TRANSFERS: Sit to stand: unable to complete without UE support from armrests  GAIT: Findings: Gait Characteristics: step through pattern, decreased stride length, decreased hip/knee flexion- Right, decreased hip/knee flexion- Left, Right foot flat, Left foot flat, poor foot clearance- Right, and poor foot clearance- Left, Assistive device utilized:Single point cane, Level of assistance: SBA, and Comments: patient utilized the [facility] in one hand and had the other hand on the wall for balance  FUNCTIONAL TESTS:  5 times sit to stand: 47.76 seconds; requires UE support from armrests  Timed up and go (TUG): 11/07/24: 41.28 seconds with cane 2 minute walk test: 12/2/2 MWT 159 ft with SPC DGI 11/23/24: 3/24 with SPC  PATIENT SURVEYS:  ABC scale: The Activities-Specific Balance Confidence (ABC) Scale 0% 10 20 30  40 50 60 70 80 90 100% No confidence<->completely confident  How confident are you that you will not lose your balance or become unsteady when you . . .   Date tested 10/31/24 12/19/24  Walk around the house 100% 85%  2. Walk up or down stairs 100% 100%  3. Bend over and pick up a slipper from in front of a closet floor 0% 20%  4. Reach for a small can off a shelf at eye level 100% 75%  5. Stand on tip toes and reach for something above your head 50% 80%  6. Stand on a chair and reach for something 0% 0%  7. Sweep the floor 0% 10%  8. Walk outside the house to a car parked in the driveway 100% 100%  9. Get into or out of a car 100% 100%  10. Walk across a parking lot to the mall 50% 75%  11. Walk up or down a ramp 25% 80%  12. Walk in a crowded mall where people rapidly walk past you 25% 50%  13. Are bumped into by people as you walk through the mall 50% 50%  14. Step onto or off of an escalator while you are holding onto the railing 100% 100%  15. Step onto or off an escalator while holding onto parcels such that you cannot hold onto the  railing 0% 0%  16. Walk outside on icy sidewalks 0% 0%  Total: #/16  50%   57.81%                                                                                                                                 TREATMENT DATE:                                     01/09/25 EXERCISE LOG  Exercise  Repetitions and Resistance Comments  Nustep  L4 x 5 minutes    Standing gastroc stretch   2 minutes    Seated clams  GTB x 3 minutes    Seated march  GTB x 3 minutes    Standing cone taps  2 minutes  Anterior; BUE support from parallel bars   Sit to stand  10 reps  From seat with airex; 3 reps without UE support and 7 reps with UE support  Seated HS curl  GTB x 1.5 minutes each    Step up  4 step x 10 reps each     Blank cell = exercise not performed today                                    01/04/25 EXERCISE LOG  Exercise Repetitions and Resistance Comments  Nustep  L4 x 5 minutes    Seated clams  GTB x 30 reps    Standing cone taps   2  minutes  Lateral; BUE support from parallel bars   Sit to stand   10 reps  From elevated seat; intermittent UE support for initiating transfer  LAQ 5# x 20 reps each  Alternating LE   Seated march  5# x 20 reps each  Alternating LE   Standing ball roll out  2 minutes  For improved stability reaching outside his BOS   Blank cell = exercise not performed today                                    01/02/25 EXERCISE LOG  Exercise Repetitions and Resistance Comments  Nustep  L4 x 5 minutes    LAQ  4# x 30 reps each  Alternating LE   Seated march   4# x 30 reps  Alternating LE   Toe tap onto step  12 x 2 minutes  Alternating LE; BUE support from parallel bars   Standing hip ABD  2 minutes  Alternating LE; BUE support from parallel bars   Sit to stand  9 reps  With BUE support from armrests   Static stance on foam 2 minutes  Frequent UE support from parallel bars   Standing HS curl  15 reps each  Cramp on RLE initially, but resolved quickly   Seated hip ADD  isometric  3 minutes w/ 5 second hold     Blank cell = exercise not performed today   PATIENT EDUCATION: Education details: POC, progress with physical therapy, and HEP  Person educated: Patient Education method: Explanation Education comprehension: verbalized understanding  HOME EXERCISE PROGRAM: Access Code: [email or web address] URL: [email or web address] Date: 10/31/2024 Prepared by: Lacinda Fass  Exercises - Seated March  - 1-2 x daily - 7 x weekly - 3 sets - 10 reps - Seated Long Arc Quad  - 1-2 x daily - 7 x weekly - 3 sets - 10 reps - Seated Heel Toe Raises  - 1-2 x daily - 7 x weekly - 3 sets - 10 reps  STS 3x10  added verbally 11/28/24 JAM   12/05/24: - Supine Bridge  - 1 x daily - 7 x weekly - 3 sets - 10 reps - 5 hold - Sit to Stand with Armchair  - 2 x daily - 7 x weekly - 1 sets - 10 reps GOALS: Goals reviewed with  patient? Yes  SHORT TERM GOALS: Target date: 11/21/24  Patient will be independent with his initial HEP. Baseline: Goal status: MET  2.  Patient will be able to transfer from sitting to standing without upper extremity support for improved independence. Baseline: unable to clear seat without UE support (able to complete from elevated seat) Goal status: ON GOING  3.  Patient will improve his 5 times sit to stand time to 30 seconds or less for improved lower extremity power. Baseline: 12/19/24: 53.21 seconds with armrests  12/28/24: 42.62 seconds with armrests  Goal status: ON GOING  LONG TERM GOALS: Target date: 12/12/24  Patient will be independent with his advanced HEP. Baseline:  Goal status: ON GOING  2.  Patient will improve his 5 times sit to stand time to 20 seconds or less for improved functional mobility. Baseline: 12/19/24: 53.21 seconds with armrests 12/28/24: 42.62 seconds with armrests Goal status: ON GOING  3.  Patient will improve his ABC scale by at least 15% for improved perceived function with his daily  activities. Baseline: 7% improvement on 12/19/24 Goal status: ON GOING  4.  Patient will be able to navigate at least 4 steps while utilizing only 1 railing for improved household mobility. Baseline:  Goal status: ON GOING  ASSESSMENT:  CLINICAL IMPRESSION: Patient was progressed with familiar interventions for improved lower extremity strength and stability with moderate difficulty. He required minimal cueing with today's standing interventions for upright posture to facilitate improved awareness of his surroundings. He required brief seated rest breaks throughout treatment due to fatigue. He reported feeling alright upon the conclusion of treatment. Patient continues to require skilled physical therapy to address their remaining impairments to maximize his safety and functional mobility.   Eval: Patient is a 89 y.o. male who was seen today for physical therapy evaluation and treatment for unsteadiness on his feet. He is a high fall risk as evidenced by his gait mechanics, objective and functional testing, and his history of falling. He also exhibited diminished sensation to light touch in both lower extremities. Recommend that he continue with skilled physical therapy to address his impairments to maximize his safety and functional mobility.    OBJECTIVE IMPAIRMENTS: Abnormal gait, decreased activity tolerance, decreased balance, decreased mobility, difficulty walking, and decreased strength.   ACTIVITY LIMITATIONS: standing, squatting, stairs, transfers, and locomotion level  PARTICIPATION LIMITATIONS: driving, shopping, community activity, and yard work  PERSONAL FACTORS: Age, Behavior pattern, Past/current experiences, Time since onset of injury/illness/exacerbation, and 3+ comorbidities: CHF, HTN, type 2 diabetes, history of Guillain-Barre, glaucoma, and history  of cancer   are also affecting patient's functional outcome.   REHAB POTENTIAL: Fair    CLINICAL DECISION MAKING:  Evolving/moderate complexity  EVALUATION COMPLEXITY: Moderate  PLAN:  PT FREQUENCY: 2x/week  PT DURATION: 6 weeks  PLANNED INTERVENTIONS: 97164- PT Re-evaluation, 97750- Physical Performance Testing, 97110-Therapeutic exercises, 97530- Therapeutic activity, 97112- Neuromuscular re-education, 97535- Self Care, [id number]- Manual therapy, [phone]- Gait training, Patient/Family education, Balance training, and Stair training  PLAN FOR NEXT SESSION: Encourage use of RW for daily activity, gait training, balance interventions, and lower extremity strengthening (utilize gait belt for standing interventions due to high fall risk) ankle mobility.    Lacinda Fass, PT, DPT  9:06 AM, 01/09/25  "

## 2025-01-12 ENCOUNTER — Encounter (HOSPITAL_COMMUNITY): Payer: Self-pay

## 2025-01-12 ENCOUNTER — Ambulatory Visit (HOSPITAL_COMMUNITY)

## 2025-01-12 DIAGNOSIS — M6281 Muscle weakness (generalized): Secondary | ICD-10-CM

## 2025-01-12 DIAGNOSIS — Z9181 History of falling: Secondary | ICD-10-CM

## 2025-01-12 NOTE — Therapy (Signed)
 " OUTPATIENT PHYSICAL THERAPY NEURO TREATMENT   Patient Name: [name] MRN: [id number] DOB:January 26, 1929, 89 y.o., male Today's Date: 01/12/2025   PCP: Sheryle Carwin, MD  REFERRING PROVIDER: Sheryle Carwin, MD   END OF SESSION:  PT End of Session - 01/12/25 0733     Visit Number 16    Number of Visits 18    Date for Recertification  01/19/25    Authorization Type Medicare part A & B with [employer] secondary    Authorization Time Period no auth required    Progress Note Due on Visit 22    PT Start Time 0734    PT Stop Time 0813    PT Time Calculation (min) 39 min    Equipment Utilized During Treatment Gait belt    Activity Tolerance Patient tolerated treatment well    Behavior During Therapy [facility] for tasks assessed/performed                    Past Medical History:  Diagnosis Date   Aortic stenosis    Status post TAVR at [facility], November 2019   CAD (coronary artery disease)    DES to ostial RCA October 2019 at [facility]   Essential hypertension    Glaucoma    Guillain-Barre syndrome 06/2013   Treated at [facility]   History of nephrolithiasis    History of pneumonia    Hyperlipidemia    Patent ductus arteriosus    Surgically repaired at age 53   Prostate cancer (HCC)    Type 2 diabetes mellitus (HCC)    Past Surgical History:  Procedure Laterality Date   CARPAL TUNNEL RELEASE  2012   left   CARPAL TUNNEL RELEASE Right 06/12/2014   Procedure: RIGHT CARPAL TUNNEL RELEASE ;  Surgeon: Arley JONELLE Curia, MD;  Location: [facility];  Service: Orthopedics;  Laterality: Right;   CATARACT EXTRACTION W/PHACO Left 10/13/2016   Procedure: CATARACT EXTRACTION PHACO AND INTRAOCULAR LENS PLACEMENT LEFT EYE CDE=15.04;  Surgeon: Oneil Platts, MD;  Location: [facility];  Service: Ophthalmology;  Laterality: Left;  left   CATARACT EXTRACTION W/PHACO Right 11/03/2016   Procedure: CATARACT EXTRACTION PHACO AND INTRAOCULAR LENS PLACEMENT (IOC);  Surgeon: Oneil Platts, MD;  Location: [facility];  Service: Ophthalmology;  Laterality: Right;  CDE: 15.10   COLONOSCOPY     KNEE ARTHROSCOPY  1962   left   Patent ductus arteriosus repair     1951 - age 78   ULNAR NERVE TRANSPOSITION Right 06/12/2014   Procedure: DECOMPRESSION ULNAR NERVE RIGHT ELBOW;  Surgeon: Arley JONELLE Curia, MD;  Location: [facility];  Service: Orthopedics;  Laterality: Right;   VIDEO ASSISTED THORACOSCOPY (VATS)/THOROCOTOMY  8/14   left-guillian-barre   Patient Active Problem List   Diagnosis Date Noted   Coronary artery disease due to lipid rich plaque 12/22/2022   S/P TAVR (transcatheter aortic valve replacement) 11/02/2018   PAD (peripheral artery disease) 11/01/2018   CHF (congestive heart failure), NYHA class II, chronic, diastolic (HCC) 10/31/2018   Diabetes mellitus type 2, uncomplicated (HCC) 04/06/2017   Primary open angle glaucoma of both eyes, moderate stage 04/06/2017   Pseudophakia 04/06/2017   Lower extremity weakness 10/23/2013   Difficulty walking 10/23/2013   Guillain-Barre 07/13/2013   DVT of lower extremity (deep venous thrombosis) (HCC) 07/07/2013   Dyspnea 07/04/2013   Hyponatremia 07/04/2013   Diabetes (HCC) 07/03/2013   Hypertension 07/03/2013   Hyperlipidemia 07/03/2013   Lung mass 07/03/2013   Rash 07/03/2013  ONSET DATE: 6-8 months ago  REFERRING DIAG: gait disturbance   THERAPY DIAG:  History of falling  Muscle weakness (generalized)  Rationale for Evaluation and Treatment: Rehabilitation  SUBJECTIVE:                                                                                                                                                                                             SUBJECTIVE STATEMENT: Pt states he has still been dealing with balance issues, presents with cane. Pt states he has not fallen since about 2 weeks ago.    Eval: Patient feels that he has been walking around like I'm drunk. He feels that it has been getting worse for  the past 6-8 months. He did not have a mechanism of injury. He did have Guillan-Barre in 2014 and he never got his legs back after that. He fell backwards yesterday, but he thinks his heel hit something causing him to fall. He is not hurting from this fall. He has been able to get up without help after each of his falls. He had tried going to the [facility], but this did not seem to help. Pt accompanied by: self  PERTINENT HISTORY: CHF, HTN, type 2 diabetes, history of Guillain-Barre, glaucoma, and history  of cancer    PAIN:  Are you having pain? No  PRECAUTIONS: Fall  RED FLAGS: None   WEIGHT BEARING RESTRICTIONS: No  FALLS: Has patient fallen in last 6 months? Yes. Number of falls 3-4  LIVING ENVIRONMENT: Lives with: lives alone Lives in: House/apartment Stairs: Yes: Internal: 25 steps; can reach both and External: 6 steps; can reach both; step to pattern and avoids going upstairs inside home Has following equipment at home: Single point cane  PLOF: Independent  PATIENT GOALS: improved balance  OBJECTIVE:  Note: Objective measures were completed at Evaluation unless otherwise noted.  COGNITION: Overall cognitive status: Within functional limits for tasks assessed   SENSATION: Light touch: Impaired  and diminished sensation to light touch in bilateral lower extremities Patient reports that he has neuropathy in both legs from his hips down to his feet in both legs  COORDINATION: WFL  EDEMA:  No edema observed   MUSCLE TONE: WFL for activities assessed  POSTURE: forward head and flexed trunk   LOWER EXTREMITY ROM: WFL for activities assessed  LOWER EXTREMITY MMT:    MMT Right Eval Left Eval Right 12/28/24 Left 12/28/24  Hip flexion 4-/5 3+/5 4/5 4-/5  Hip extension      Hip abduction      Hip adduction 4+/5 4+/5 5/5 5/5  Hip internal rotation  Hip external rotation      Knee flexion 4/5 4/5 4+/5 4/5  Knee extension 4/5 3+/5 3+/5 3+/5  Ankle dorsiflexion 3/5 3/5  3/5 3/5  Ankle plantarflexion      Ankle inversion      Ankle eversion      (Blank rows = not tested)  TRANSFERS: Sit to stand: unable to complete without UE support from armrests  GAIT: Findings: Gait Characteristics: step through pattern, decreased stride length, decreased hip/knee flexion- Right, decreased hip/knee flexion- Left, Right foot flat, Left foot flat, poor foot clearance- Right, and poor foot clearance- Left, Assistive device utilized:Single point cane, Level of assistance: SBA, and Comments: patient utilized the [facility] in one hand and had the other hand on the wall for balance  FUNCTIONAL TESTS:  5 times sit to stand: 47.76 seconds; requires UE support from armrests  Timed up and go (TUG): 11/07/24: 41.28 seconds with cane 2 minute walk test: 12/2/2 MWT 159 ft with SPC DGI 11/23/24: 3/24 with SPC  PATIENT SURVEYS:  ABC scale: The Activities-Specific Balance Confidence (ABC) Scale 0% 10 20 30  40 50 60 70 80 90 100% No confidence<->completely confident  How confident are you that you will not lose your balance or become unsteady when you . . .   Date tested 10/31/24 12/19/24  Walk around the house 100% 85%  2. Walk up or down stairs 100% 100%  3. Bend over and pick up a slipper from in front of a closet floor 0% 20%  4. Reach for a small can off a shelf at eye level 100% 75%  5. Stand on tip toes and reach for something above your head 50% 80%  6. Stand on a chair and reach for something 0% 0%  7. Sweep the floor 0% 10%  8. Walk outside the house to a car parked in the driveway 100% 100%  9. Get into or out of a car 100% 100%  10. Walk across a parking lot to the mall 50% 75%  11. Walk up or down a ramp 25% 80%  12. Walk in a crowded mall where people rapidly walk past you 25% 50%  13. Are bumped into by people as you walk through the mall 50% 50%  14. Step onto or off of an escalator while you are holding onto the railing 100% 100%  15. Step onto or off an escalator  while holding onto parcels such that you cannot hold onto the railing 0% 0%  16. Walk outside on icy sidewalks 0% 0%  Total: #/16  50%   57.81%                                                                                                                                 TREATMENT DATE:   01/12/2025  Therapeutic Exercise: -Stationary bike full, 30 seconds, pt asks to cease due to left knee pain -Nustep, 5 minutes, level 4 resistance,  pt cued for 80-100 spm  -Heel raises, 1 set of 7 reps, pt cued for max ankle ROM Neuromuscular Reeducation:  -Dorsiflexion marches, 5lb kettle bell, 1 set of 5 reps bilaterally -Standing balance, staggered stance, 6 inch step, 1 bout 30 seconds bilaterally -Standing narrow BOS, 1 bout of 30 seconds Therapeutic Activity: -Step up and overs, 6 inch step, 2 sets of 5 reps, bilaterally, pt cued for increased pace -Walking marches/butt kicks, 3 lap in parallel bars, 3 lb ankle weights on ankles, pt cued for max ROM -Trampoline tosses, 3 sets of 10 throws, yellow weighted ball, STS between each rep                                   01/09/25 EXERCISE LOG  Exercise Repetitions and Resistance Comments  Nustep  L4 x 5 minutes    Standing gastroc stretch   2 minutes    Seated clams  GTB x 3 minutes    Seated march  GTB x 3 minutes    Standing cone taps  2 minutes  Anterior; BUE support from parallel bars   Sit to stand  10 reps  From seat with airex; 3 reps without UE support and 7 reps with UE support  Seated HS curl  GTB x 1.5 minutes each    Step up  4 step x 10 reps each     Blank cell = exercise not performed today                                    01/04/25 EXERCISE LOG  Exercise Repetitions and Resistance Comments  Nustep  L4 x 5 minutes    Seated clams  GTB x 30 reps    Standing cone taps   2  minutes  Lateral; BUE support from parallel bars   Sit to stand   10 reps  From elevated seat; intermittent UE support for initiating transfer  LAQ 5# x 20 reps  each  Alternating LE   Seated march  5# x 20 reps each  Alternating LE   Standing ball roll out  2 minutes  For improved stability reaching outside his BOS   Blank cell = exercise not performed today    PATIENT EDUCATION: Education details: POC, progress with physical therapy, and HEP  Person educated: Patient Education method: Explanation Education comprehension: verbalized understanding  HOME EXERCISE PROGRAM: Access Code: [email or web address] URL: [email or web address] Date: 10/31/2024 Prepared by: Lacinda Fass  Exercises - Seated March  - 1-2 x daily - 7 x weekly - 3 sets - 10 reps - Seated Long Arc Quad  - 1-2 x daily - 7 x weekly - 3 sets - 10 reps - Seated Heel Toe Raises  - 1-2 x daily - 7 x weekly - 3 sets - 10 reps  STS 3x10  added verbally 11/28/24 JAM   12/05/24: - Supine Bridge  - 1 x daily - 7 x weekly - 3 sets - 10 reps - 5 hold - Sit to Stand with Armchair  - 2 x daily - 7 x weekly - 1 sets - 10 reps GOALS: Goals reviewed with patient? Yes  SHORT TERM GOALS: Target date: 11/21/24  Patient will be independent with his initial HEP. Baseline: Goal status: MET  2.  Patient will be able to transfer from sitting to standing without  upper extremity support for improved independence. Baseline: unable to clear seat without UE support (able to complete from elevated seat) Goal status: ON GOING  3.  Patient will improve his 5 times sit to stand time to 30 seconds or less for improved lower extremity power. Baseline: 12/19/24: 53.21 seconds with armrests  12/28/24: 42.62 seconds with armrests  Goal status: ON GOING  LONG TERM GOALS: Target date: 12/12/24  Patient will be independent with his advanced HEP. Baseline:  Goal status: ON GOING  2.  Patient will improve his 5 times sit to stand time to 20 seconds or less for improved functional mobility. Baseline: 12/19/24: 53.21 seconds with armrests 12/28/24: 42.62 seconds with armrests Goal status: ON GOING  3.   Patient will improve his ABC scale by at least 15% for improved perceived function with his daily activities. Baseline: 7% improvement on 12/19/24 Goal status: ON GOING  4.  Patient will be able to navigate at least 4 steps while utilizing only 1 railing for improved household mobility. Baseline:  Goal status: ON GOING  ASSESSMENT:  CLINICAL IMPRESSION: Patient continues to demonstrate decreased gait quality and balance. Patient also demonstrates decreased endurance with aerobic/power based exercise during today's session with SOB noted. Patient able to progress dynamic balance and core activation exercises today with standing balance variations and resisted walks in parallel bars, good performance with verbal cueing. Patient educated on importance of consistent HEP compliance and use of RW for increased safety. Patient would continue to benefit from skilled physical therapy for decreased fall risk, increased endurance with ambulation, increased LE strength/ROM, and improved balance for improved quality of life, improved independence with community ambulation and continued progress towards therapy goals.   Eval: Patient is a 89 y.o. male who was seen today for physical therapy evaluation and treatment for unsteadiness on his feet. He is a high fall risk as evidenced by his gait mechanics, objective and functional testing, and his history of falling. He also exhibited diminished sensation to light touch in both lower extremities. Recommend that he continue with skilled physical therapy to address his impairments to maximize his safety and functional mobility.    OBJECTIVE IMPAIRMENTS: Abnormal gait, decreased activity tolerance, decreased balance, decreased mobility, difficulty walking, and decreased strength.   ACTIVITY LIMITATIONS: standing, squatting, stairs, transfers, and locomotion level  PARTICIPATION LIMITATIONS: driving, shopping, community activity, and yard work  PERSONAL FACTORS:  Age, Behavior pattern, Past/current experiences, Time since onset of injury/illness/exacerbation, and 3+ comorbidities: CHF, HTN, type 2 diabetes, history of Guillain-Barre, glaucoma, and history  of cancer   are also affecting patient's functional outcome.   REHAB POTENTIAL: Fair    CLINICAL DECISION MAKING: Evolving/moderate complexity  EVALUATION COMPLEXITY: Moderate  PLAN:  PT FREQUENCY: 2x/week  PT DURATION: 6 weeks  PLANNED INTERVENTIONS: 97164- PT Re-evaluation, 97750- Physical Performance Testing, 97110-Therapeutic exercises, 97530- Therapeutic activity, 97112- Neuromuscular re-education, 97535- Self Care, [id number]- Manual therapy, [phone]- Gait training, Patient/Family education, Balance training, and Stair training  PLAN FOR NEXT SESSION: Encourage use of RW for daily activity, gait training, balance interventions, and lower extremity strengthening (utilize gait belt for standing interventions due to high fall risk) ankle mobility.    Lang Ada, PT, DPT [facility] Office: [phone] 8:21 AM, 01/12/25   "

## 2025-01-16 ENCOUNTER — Telehealth (HOSPITAL_COMMUNITY): Payer: Self-pay

## 2025-01-16 ENCOUNTER — Ambulatory Visit (HOSPITAL_COMMUNITY)

## 2025-01-16 NOTE — Telephone Encounter (Signed)
 No show, called and spoke to pt who stated he had tried to call (stated number dialed that was fax number) but unable to get through. Pt given correct contact number and reminded next apt date and time.   Augustin Mclean, LPTA/CLT; [facility] [phone]

## 2025-01-19 ENCOUNTER — Ambulatory Visit (HOSPITAL_COMMUNITY)

## 2025-01-19 ENCOUNTER — Encounter (HOSPITAL_COMMUNITY): Payer: Self-pay

## 2025-01-19 DIAGNOSIS — Z9181 History of falling: Secondary | ICD-10-CM | POA: Diagnosis not present

## 2025-01-19 DIAGNOSIS — M6281 Muscle weakness (generalized): Secondary | ICD-10-CM

## 2025-01-19 NOTE — Therapy (Signed)
 " OUTPATIENT PHYSICAL THERAPY NEURO TREATMENT   Patient Name: [name] MRN: [id number] DOB:09-Nov-1929, 89 y.o., male Today's Date: 01/19/2025   PCP: Sheryle Carwin, MD  REFERRING PROVIDER: Sheryle Carwin, MD   END OF SESSION:  PT End of Session - 01/19/25 0735     Visit Number 17    Number of Visits 18    Date for Recertification  01/19/25    Authorization Type Medicare part A & B with [employer] secondary    Authorization Time Period no auth required    Progress Note Due on Visit 22    PT Start Time 0730    PT Stop Time 0813    PT Time Calculation (min) 43 min    Equipment Utilized During Treatment Gait belt    Activity Tolerance Patient tolerated treatment well    Behavior During Therapy [facility] for tasks assessed/performed                     Past Medical History:  Diagnosis Date   Aortic stenosis    Status post TAVR at [facility], November 2019   CAD (coronary artery disease)    DES to ostial RCA October 2019 at [facility]   Essential hypertension    Glaucoma    Guillain-Barre syndrome 06/2013   Treated at [facility]   History of nephrolithiasis    History of pneumonia    Hyperlipidemia    Patent ductus arteriosus    Surgically repaired at age 62   Prostate cancer (HCC)    Type 2 diabetes mellitus (HCC)    Past Surgical History:  Procedure Laterality Date   CARPAL TUNNEL RELEASE  2012   left   CARPAL TUNNEL RELEASE Right 06/12/2014   Procedure: RIGHT CARPAL TUNNEL RELEASE ;  Surgeon: Arley JONELLE Curia, MD;  Location: [facility];  Service: Orthopedics;  Laterality: Right;   CATARACT EXTRACTION W/PHACO Left 10/13/2016   Procedure: CATARACT EXTRACTION PHACO AND INTRAOCULAR LENS PLACEMENT LEFT EYE CDE=15.04;  Surgeon: Oneil Platts, MD;  Location: [facility];  Service: Ophthalmology;  Laterality: Left;  left   CATARACT EXTRACTION W/PHACO Right 11/03/2016   Procedure: CATARACT EXTRACTION PHACO AND INTRAOCULAR LENS PLACEMENT (IOC);  Surgeon: Oneil Platts, MD;  Location:  [facility];  Service: Ophthalmology;  Laterality: Right;  CDE: 15.10   COLONOSCOPY     KNEE ARTHROSCOPY  1962   left   Patent ductus arteriosus repair     1951 - age 27   ULNAR NERVE TRANSPOSITION Right 06/12/2014   Procedure: DECOMPRESSION ULNAR NERVE RIGHT ELBOW;  Surgeon: Arley JONELLE Curia, MD;  Location: [facility];  Service: Orthopedics;  Laterality: Right;   VIDEO ASSISTED THORACOSCOPY (VATS)/THOROCOTOMY  8/14   left-guillian-barre   Patient Active Problem List   Diagnosis Date Noted   Coronary artery disease due to lipid rich plaque 12/22/2022   S/P TAVR (transcatheter aortic valve replacement) 11/02/2018   PAD (peripheral artery disease) 11/01/2018   CHF (congestive heart failure), NYHA class II, chronic, diastolic (HCC) 10/31/2018   Diabetes mellitus type 2, uncomplicated (HCC) 04/06/2017   Primary open angle glaucoma of both eyes, moderate stage 04/06/2017   Pseudophakia 04/06/2017   Lower extremity weakness 10/23/2013   Difficulty walking 10/23/2013   Guillain-Barre 07/13/2013   DVT of lower extremity (deep venous thrombosis) (HCC) 07/07/2013   Dyspnea 07/04/2013   Hyponatremia 07/04/2013   Diabetes (HCC) 07/03/2013   Hypertension 07/03/2013   Hyperlipidemia 07/03/2013   Lung mass 07/03/2013   Rash 07/03/2013  ONSET DATE: 6-8 months ago  REFERRING DIAG: gait disturbance   THERAPY DIAG:  History of falling  Muscle weakness (generalized)  Rationale for Evaluation and Treatment: Rehabilitation  SUBJECTIVE:                                                                                                                                                                                             SUBJECTIVE STATEMENT: Patient reports that he feels good today. He has needed to be very careful the past few days to keep from falling due to the ice.   Eval: Patient feels that he has been walking around like I'm drunk. He feels that it has been getting worse for  the past 6-8 months. He did not have a mechanism of injury. He did have Guillan-Barre in 2014 and he never got his legs back after that. He fell backwards yesterday, but he thinks his heel hit something causing him to fall. He is not hurting from this fall. He has been able to get up without help after each of his falls. He had tried going to the [facility], but this did not seem to help. Pt accompanied by: self  PERTINENT HISTORY: CHF, HTN, type 2 diabetes, history of Guillain-Barre, glaucoma, and history  of cancer    PAIN:  Are you having pain? No  PRECAUTIONS: Fall  RED FLAGS: None   WEIGHT BEARING RESTRICTIONS: No  FALLS: Has patient fallen in last 6 months? Yes. Number of falls 3-4  LIVING ENVIRONMENT: Lives with: lives alone Lives in: House/apartment Stairs: Yes: Internal: 25 steps; can reach both and External: 6 steps; can reach both; step to pattern and avoids going upstairs inside home Has following equipment at home: Single point cane  PLOF: Independent  PATIENT GOALS: improved balance  OBJECTIVE:  Note: Objective measures were completed at Evaluation unless otherwise noted.  COGNITION: Overall cognitive status: Within functional limits for tasks assessed   SENSATION: Light touch: Impaired  and diminished sensation to light touch in bilateral lower extremities Patient reports that he has neuropathy in both legs from his hips down to his feet in both legs  COORDINATION: WFL  EDEMA:  No edema observed   MUSCLE TONE: WFL for activities assessed  POSTURE: forward head and flexed trunk   LOWER EXTREMITY ROM: WFL for activities assessed  LOWER EXTREMITY MMT:    MMT Right Eval Left Eval Right 12/28/24 Left 12/28/24  Hip flexion 4-/5 3+/5 4/5 4-/5  Hip extension      Hip abduction      Hip adduction 4+/5 4+/5 5/5 5/5  Hip internal rotation  Hip external rotation      Knee flexion 4/5 4/5 4+/5 4/5  Knee extension 4/5 3+/5 3+/5 3+/5  Ankle dorsiflexion 3/5 3/5  3/5 3/5  Ankle plantarflexion      Ankle inversion      Ankle eversion      (Blank rows = not tested)  TRANSFERS: Sit to stand: unable to complete without UE support from armrests  GAIT: Findings: Gait Characteristics: step through pattern, decreased stride length, decreased hip/knee flexion- Right, decreased hip/knee flexion- Left, Right foot flat, Left foot flat, poor foot clearance- Right, and poor foot clearance- Left, Assistive device utilized:Single point cane, Level of assistance: SBA, and Comments: patient utilized the [facility] in one hand and had the other hand on the wall for balance  FUNCTIONAL TESTS:  5 times sit to stand: 47.76 seconds; requires UE support from armrests  Timed up and go (TUG): 11/07/24: 41.28 seconds with cane 2 minute walk test: 12/2/2 MWT 159 ft with SPC DGI 11/23/24: 3/24 with SPC  PATIENT SURVEYS:  ABC scale: The Activities-Specific Balance Confidence (ABC) Scale 0% 10 20 30  40 50 60 70 80 90 100% No confidence<->completely confident  How confident are you that you will not lose your balance or become unsteady when you . . .   Date tested 10/31/24 12/19/24  Walk around the house 100% 85%  2. Walk up or down stairs 100% 100%  3. Bend over and pick up a slipper from in front of a closet floor 0% 20%  4. Reach for a small can off a shelf at eye level 100% 75%  5. Stand on tip toes and reach for something above your head 50% 80%  6. Stand on a chair and reach for something 0% 0%  7. Sweep the floor 0% 10%  8. Walk outside the house to a car parked in the driveway 100% 100%  9. Get into or out of a car 100% 100%  10. Walk across a parking lot to the mall 50% 75%  11. Walk up or down a ramp 25% 80%  12. Walk in a crowded mall where people rapidly walk past you 25% 50%  13. Are bumped into by people as you walk through the mall 50% 50%  14. Step onto or off of an escalator while you are holding onto the railing 100% 100%  15. Step onto or off an escalator  while holding onto parcels such that you cannot hold onto the railing 0% 0%  16. Walk outside on icy sidewalks 0% 0%  Total: #/16  50%   57.81%                                                                                                                                 TREATMENT DATE:  01/19/25 EXERCISE LOG  Exercise Repetitions and Resistance Comments  Nustep  L4 x 5 minutes    Standing balance   2 x 1 minute each  Staggered stance w/ leading foot on 6 step; frequent UE support from parallel bars  Sit to stand   2 x 5 reps  Minimal UE support to achieve terminal stance   Walking march/butt kicks  2 laps in parallel bars each  BUE support from parallel bars   Standing hip ABD   20 reps each  BUE support from parallel bars    Blank cell = exercise not performed today   01/12/2025  Therapeutic Exercise: -Stationary bike full, 30 seconds, pt asks to cease due to left knee pain -Nustep, 5 minutes, level 4 resistance, pt cued for 80-100 spm  -Heel raises, 1 set of 7 reps, pt cued for max ankle ROM Neuromuscular Reeducation:  -Dorsiflexion marches, 5lb kettle bell, 1 set of 5 reps bilaterally -Standing balance, staggered stance, 6 inch step, 1 bout 30 seconds bilaterally -Standing narrow BOS, 1 bout of 30 seconds Therapeutic Activity: -Step up and overs, 6 inch step, 2 sets of 5 reps, bilaterally, pt cued for increased pace -Walking marches/butt kicks, 3 lap in parallel bars, 3 lb ankle weights on ankles, pt cued for max ROM -Trampoline tosses, 3 sets of 10 throws, yellow weighted ball, STS between each rep                                   01/09/25 EXERCISE LOG  Exercise Repetitions and Resistance Comments  Nustep  L4 x 5 minutes    Standing gastroc stretch   2 minutes    Seated clams  GTB x 3 minutes    Seated march  GTB x 3 minutes    Standing cone taps  2 minutes  Anterior; BUE support from parallel bars   Sit to stand  10 reps  From seat with  airex; 3 reps without UE support and 7 reps with UE support  Seated HS curl  GTB x 1.5 minutes each    Step up  4 step x 10 reps each     Blank cell = exercise not performed today    PATIENT EDUCATION: Education details: POC, progress with physical therapy, and HEP  Person educated: Patient Education method: Explanation Education comprehension: verbalized understanding  HOME EXERCISE PROGRAM: Access Code: [email or web address] URL: [email or web address] Date: 10/31/2024 Prepared by: Lacinda Fass  Exercises - Seated March  - 1-2 x daily - 7 x weekly - 3 sets - 10 reps - Seated Long Arc Quad  - 1-2 x daily - 7 x weekly - 3 sets - 10 reps - Seated Heel Toe Raises  - 1-2 x daily - 7 x weekly - 3 sets - 10 reps  STS 3x10  added verbally 11/28/24 JAM   12/05/24: - Supine Bridge  - 1 x daily - 7 x weekly - 3 sets - 10 reps - 5 hold - Sit to Stand with Armchair  - 2 x daily - 7 x weekly - 1 sets - 10 reps GOALS: Goals reviewed with patient? Yes  SHORT TERM GOALS: Target date: 11/21/24  Patient will be independent with his initial HEP. Baseline: Goal status: MET  2.  Patient will be able to transfer from sitting to standing without upper extremity support for improved independence. Baseline: unable to clear seat without UE support (able to  complete from elevated seat) Goal status: ON GOING  3.  Patient will improve his 5 times sit to stand time to 30 seconds or less for improved lower extremity power. Baseline: 12/19/24: 53.21 seconds with armrests  12/28/24: 42.62 seconds with armrests  Goal status: ON GOING  LONG TERM GOALS: Target date: 12/12/24  Patient will be independent with his advanced HEP. Baseline:  Goal status: ON GOING  2.  Patient will improve his 5 times sit to stand time to 20 seconds or less for improved functional mobility. Baseline: 12/19/24: 53.21 seconds with armrests 12/28/24: 42.62 seconds with armrests Goal status: ON GOING  3.  Patient will  improve his ABC scale by at least 15% for improved perceived function with his daily activities. Baseline: 7% improvement on 12/19/24 Goal status: ON GOING  4.  Patient will be able to navigate at least 4 steps while utilizing only 1 railing for improved household mobility. Baseline:  Goal status: ON GOING  ASSESSMENT:  CLINICAL IMPRESSION: Patient was progressed with familiar interventions for improved lower extremity strength and stability. He required minimal cueing with today's standing interventions for upright stance to improve his stability and awareness of his surroundings. He required brief seated rest breaks throughout treatment due to increased fatigue with today's standing interventions. He reported feeling alright upon the conclusion of treatment. Patient continues to require skilled physical therapy to address their remaining impairments to return to her prior level of function.     Eval: Patient is a 89 y.o. male who was seen today for physical therapy evaluation and treatment for unsteadiness on his feet. He is a high fall risk as evidenced by his gait mechanics, objective and functional testing, and his history of falling. He also exhibited diminished sensation to light touch in both lower extremities. Recommend that he continue with skilled physical therapy to address his impairments to maximize his safety and functional mobility.    OBJECTIVE IMPAIRMENTS: Abnormal gait, decreased activity tolerance, decreased balance, decreased mobility, difficulty walking, and decreased strength.   ACTIVITY LIMITATIONS: standing, squatting, stairs, transfers, and locomotion level  PARTICIPATION LIMITATIONS: driving, shopping, community activity, and yard work  PERSONAL FACTORS: Age, Behavior pattern, Past/current experiences, Time since onset of injury/illness/exacerbation, and 3+ comorbidities: CHF, HTN, type 2 diabetes, history of Guillain-Barre, glaucoma, and history  of cancer   are also  affecting patient's functional outcome.   REHAB POTENTIAL: Fair    CLINICAL DECISION MAKING: Evolving/moderate complexity  EVALUATION COMPLEXITY: Moderate  PLAN:  PT FREQUENCY: 2x/week  PT DURATION: 6 weeks  PLANNED INTERVENTIONS: 97164- PT Re-evaluation, 97750- Physical Performance Testing, 97110-Therapeutic exercises, 97530- Therapeutic activity, 97112- Neuromuscular re-education, 97535- Self Care, [id number]- Manual therapy, [phone]- Gait training, Patient/Family education, Balance training, and Stair training  PLAN FOR NEXT SESSION: Encourage use of RW for daily activity, gait training, balance interventions, and lower extremity strengthening (utilize gait belt for standing interventions due to high fall risk) ankle mobility.    Lacinda Fass, PT, DPT  [facility] Office: [phone] 8:49 AM, 01/19/25   "

## 2025-01-30 ENCOUNTER — Ambulatory Visit (HOSPITAL_COMMUNITY)
